# Patient Record
Sex: Female | Born: 1945 | Race: White | Hispanic: No | Marital: Married | Smoking: Never smoker
Health system: Southern US, Community
[De-identification: ages and names within clinical notes are randomized; demographics above are authoritative.]

## PROBLEM LIST (undated history)

## (undated) DIAGNOSIS — I7300 Raynaud's syndrome without gangrene: Secondary | ICD-10-CM

## (undated) DIAGNOSIS — Z9889 Other specified postprocedural states: Secondary | ICD-10-CM

## (undated) DIAGNOSIS — U071 COVID-19: Secondary | ICD-10-CM

## (undated) DIAGNOSIS — E669 Obesity, unspecified: Secondary | ICD-10-CM

## (undated) DIAGNOSIS — L509 Urticaria, unspecified: Secondary | ICD-10-CM

## (undated) DIAGNOSIS — R112 Nausea with vomiting, unspecified: Secondary | ICD-10-CM

## (undated) DIAGNOSIS — I499 Cardiac arrhythmia, unspecified: Secondary | ICD-10-CM

## (undated) DIAGNOSIS — T4145XA Adverse effect of unspecified anesthetic, initial encounter: Secondary | ICD-10-CM

## (undated) DIAGNOSIS — M1990 Unspecified osteoarthritis, unspecified site: Secondary | ICD-10-CM

## (undated) DIAGNOSIS — R079 Chest pain, unspecified: Secondary | ICD-10-CM

## (undated) DIAGNOSIS — T8859XA Other complications of anesthesia, initial encounter: Secondary | ICD-10-CM

## (undated) DIAGNOSIS — E785 Hyperlipidemia, unspecified: Secondary | ICD-10-CM

## (undated) DIAGNOSIS — K219 Gastro-esophageal reflux disease without esophagitis: Secondary | ICD-10-CM

## (undated) DIAGNOSIS — E119 Type 2 diabetes mellitus without complications: Secondary | ICD-10-CM

## (undated) DIAGNOSIS — H1045 Other chronic allergic conjunctivitis: Secondary | ICD-10-CM

## (undated) DIAGNOSIS — E039 Hypothyroidism, unspecified: Secondary | ICD-10-CM

## (undated) HISTORY — PX: TONSILLECTOMY: SUR1361

## (undated) HISTORY — DX: Other chronic allergic conjunctivitis: H10.45

## (undated) HISTORY — DX: Unspecified osteoarthritis, unspecified site: M19.90

## (undated) HISTORY — PX: OTHER SURGICAL HISTORY: SHX169

## (undated) HISTORY — DX: Raynaud's syndrome without gangrene: I73.00

## (undated) HISTORY — PX: VESICOVAGINAL FISTULA CLOSURE W/ TAH: SUR271

## (undated) HISTORY — DX: Type 2 diabetes mellitus without complications: E11.9

## (undated) HISTORY — DX: Urticaria, unspecified: L50.9

## (undated) HISTORY — PX: OOPHORECTOMY: SHX86

## (undated) HISTORY — PX: CHOLECYSTECTOMY: SHX55

## (undated) HISTORY — DX: COVID-19: U07.1

---

## 1998-03-29 ENCOUNTER — Other Ambulatory Visit: Admission: RE | Admit: 1998-03-29 | Discharge: 1998-03-29 | Payer: Self-pay | Admitting: Obstetrics and Gynecology

## 1999-02-28 ENCOUNTER — Other Ambulatory Visit: Admission: RE | Admit: 1999-02-28 | Discharge: 1999-02-28 | Payer: Self-pay | Admitting: Obstetrics and Gynecology

## 2000-04-13 ENCOUNTER — Encounter: Payer: Self-pay | Admitting: Rheumatology

## 2000-04-13 ENCOUNTER — Encounter: Admission: RE | Admit: 2000-04-13 | Discharge: 2000-04-13 | Payer: Self-pay | Admitting: *Deleted

## 2000-04-25 ENCOUNTER — Other Ambulatory Visit: Admission: RE | Admit: 2000-04-25 | Discharge: 2000-04-25 | Payer: Self-pay | Admitting: Obstetrics and Gynecology

## 2000-05-01 ENCOUNTER — Encounter (INDEPENDENT_AMBULATORY_CARE_PROVIDER_SITE_OTHER): Payer: Self-pay

## 2000-05-01 ENCOUNTER — Observation Stay (HOSPITAL_COMMUNITY): Admission: RE | Admit: 2000-05-01 | Discharge: 2000-05-02 | Payer: Self-pay | Admitting: *Deleted

## 2000-05-14 ENCOUNTER — Encounter: Admission: RE | Admit: 2000-05-14 | Discharge: 2000-05-14 | Payer: Self-pay | Admitting: *Deleted

## 2000-12-10 ENCOUNTER — Ambulatory Visit (HOSPITAL_COMMUNITY): Admission: RE | Admit: 2000-12-10 | Discharge: 2000-12-10 | Payer: Self-pay | Admitting: Ophthalmology

## 2000-12-17 ENCOUNTER — Ambulatory Visit (HOSPITAL_COMMUNITY): Admission: RE | Admit: 2000-12-17 | Discharge: 2000-12-17 | Payer: Self-pay | Admitting: Ophthalmology

## 2001-01-14 ENCOUNTER — Ambulatory Visit (HOSPITAL_COMMUNITY): Admission: RE | Admit: 2001-01-14 | Discharge: 2001-01-14 | Payer: Self-pay | Admitting: Ophthalmology

## 2001-06-20 ENCOUNTER — Ambulatory Visit (HOSPITAL_COMMUNITY): Admission: RE | Admit: 2001-06-20 | Discharge: 2001-06-20 | Payer: Self-pay | Admitting: Obstetrics and Gynecology

## 2001-06-20 ENCOUNTER — Encounter (INDEPENDENT_AMBULATORY_CARE_PROVIDER_SITE_OTHER): Payer: Self-pay

## 2001-08-05 ENCOUNTER — Other Ambulatory Visit: Admission: RE | Admit: 2001-08-05 | Discharge: 2001-08-05 | Payer: Self-pay | Admitting: Obstetrics and Gynecology

## 2002-01-02 HISTORY — PX: VAGINAL HYSTERECTOMY: SUR661

## 2002-08-11 ENCOUNTER — Other Ambulatory Visit: Admission: RE | Admit: 2002-08-11 | Discharge: 2002-08-11 | Payer: Self-pay | Admitting: Obstetrics and Gynecology

## 2002-09-02 ENCOUNTER — Encounter (INDEPENDENT_AMBULATORY_CARE_PROVIDER_SITE_OTHER): Payer: Self-pay | Admitting: Specialist

## 2002-09-03 ENCOUNTER — Inpatient Hospital Stay (HOSPITAL_COMMUNITY): Admission: AD | Admit: 2002-09-03 | Discharge: 2002-09-04 | Payer: Self-pay | Admitting: Obstetrics and Gynecology

## 2004-02-22 ENCOUNTER — Other Ambulatory Visit: Admission: RE | Admit: 2004-02-22 | Discharge: 2004-02-22 | Payer: Self-pay | Admitting: Obstetrics and Gynecology

## 2005-02-15 ENCOUNTER — Ambulatory Visit: Payer: Self-pay | Admitting: Critical Care Medicine

## 2005-02-27 ENCOUNTER — Other Ambulatory Visit: Admission: RE | Admit: 2005-02-27 | Discharge: 2005-02-27 | Payer: Self-pay | Admitting: Obstetrics and Gynecology

## 2005-04-10 ENCOUNTER — Ambulatory Visit: Payer: Self-pay | Admitting: Internal Medicine

## 2006-03-12 ENCOUNTER — Ambulatory Visit: Payer: Self-pay | Admitting: Internal Medicine

## 2006-03-12 LAB — CONVERTED CEMR LAB: Streptococcus, Group A Screen (Direct): NEGATIVE

## 2006-04-02 ENCOUNTER — Other Ambulatory Visit: Admission: RE | Admit: 2006-04-02 | Discharge: 2006-04-02 | Payer: Self-pay | Admitting: Obstetrics and Gynecology

## 2006-04-09 ENCOUNTER — Ambulatory Visit: Payer: Self-pay | Admitting: Internal Medicine

## 2007-02-07 ENCOUNTER — Telehealth (INDEPENDENT_AMBULATORY_CARE_PROVIDER_SITE_OTHER): Payer: Self-pay | Admitting: *Deleted

## 2007-02-07 ENCOUNTER — Encounter: Payer: Self-pay | Admitting: Internal Medicine

## 2007-02-07 DIAGNOSIS — I7300 Raynaud's syndrome without gangrene: Secondary | ICD-10-CM

## 2007-02-07 DIAGNOSIS — M1990 Unspecified osteoarthritis, unspecified site: Secondary | ICD-10-CM | POA: Insufficient documentation

## 2007-02-07 DIAGNOSIS — J3089 Other allergic rhinitis: Secondary | ICD-10-CM

## 2007-02-07 DIAGNOSIS — J302 Other seasonal allergic rhinitis: Secondary | ICD-10-CM

## 2007-02-07 DIAGNOSIS — L509 Urticaria, unspecified: Secondary | ICD-10-CM | POA: Insufficient documentation

## 2007-02-07 DIAGNOSIS — H1045 Other chronic allergic conjunctivitis: Secondary | ICD-10-CM

## 2007-02-11 ENCOUNTER — Ambulatory Visit: Payer: Self-pay | Admitting: Internal Medicine

## 2007-02-11 DIAGNOSIS — K219 Gastro-esophageal reflux disease without esophagitis: Secondary | ICD-10-CM | POA: Insufficient documentation

## 2007-02-27 ENCOUNTER — Encounter: Payer: Self-pay | Admitting: Orthopaedic Surgery

## 2007-03-03 ENCOUNTER — Encounter: Payer: Self-pay | Admitting: Orthopaedic Surgery

## 2007-04-09 ENCOUNTER — Encounter: Payer: Self-pay | Admitting: Internal Medicine

## 2007-05-13 ENCOUNTER — Encounter: Admission: RE | Admit: 2007-05-13 | Discharge: 2007-05-13 | Payer: Self-pay | Admitting: Orthopedic Surgery

## 2007-06-18 ENCOUNTER — Telehealth (INDEPENDENT_AMBULATORY_CARE_PROVIDER_SITE_OTHER): Payer: Self-pay | Admitting: *Deleted

## 2007-07-01 ENCOUNTER — Encounter: Payer: Self-pay | Admitting: Internal Medicine

## 2007-07-01 ENCOUNTER — Other Ambulatory Visit: Admission: RE | Admit: 2007-07-01 | Discharge: 2007-07-01 | Payer: Self-pay | Admitting: Obstetrics and Gynecology

## 2007-09-23 ENCOUNTER — Telehealth (INDEPENDENT_AMBULATORY_CARE_PROVIDER_SITE_OTHER): Payer: Self-pay | Admitting: *Deleted

## 2007-09-25 ENCOUNTER — Telehealth (INDEPENDENT_AMBULATORY_CARE_PROVIDER_SITE_OTHER): Payer: Self-pay | Admitting: *Deleted

## 2008-02-10 ENCOUNTER — Ambulatory Visit: Payer: Self-pay | Admitting: Internal Medicine

## 2008-02-17 ENCOUNTER — Ambulatory Visit: Payer: Self-pay | Admitting: Obstetrics and Gynecology

## 2008-07-18 ENCOUNTER — Emergency Department (HOSPITAL_COMMUNITY): Admission: EM | Admit: 2008-07-18 | Discharge: 2008-07-18 | Payer: Self-pay | Admitting: Emergency Medicine

## 2008-07-20 ENCOUNTER — Ambulatory Visit: Payer: Self-pay | Admitting: Obstetrics and Gynecology

## 2008-07-22 ENCOUNTER — Ambulatory Visit: Payer: Self-pay | Admitting: Obstetrics and Gynecology

## 2008-10-07 ENCOUNTER — Telehealth (INDEPENDENT_AMBULATORY_CARE_PROVIDER_SITE_OTHER): Payer: Self-pay | Admitting: *Deleted

## 2009-02-08 ENCOUNTER — Ambulatory Visit: Payer: Self-pay | Admitting: Internal Medicine

## 2009-02-08 DIAGNOSIS — J018 Other acute sinusitis: Secondary | ICD-10-CM | POA: Insufficient documentation

## 2009-07-06 ENCOUNTER — Ambulatory Visit: Payer: Self-pay | Admitting: Internal Medicine

## 2009-08-23 ENCOUNTER — Telehealth (INDEPENDENT_AMBULATORY_CARE_PROVIDER_SITE_OTHER): Payer: Self-pay | Admitting: *Deleted

## 2009-10-01 ENCOUNTER — Ambulatory Visit (HOSPITAL_BASED_OUTPATIENT_CLINIC_OR_DEPARTMENT_OTHER): Admission: RE | Admit: 2009-10-01 | Discharge: 2009-10-01 | Payer: Self-pay | Admitting: Orthopedic Surgery

## 2009-11-23 ENCOUNTER — Telehealth (INDEPENDENT_AMBULATORY_CARE_PROVIDER_SITE_OTHER): Payer: Self-pay | Admitting: *Deleted

## 2009-12-09 ENCOUNTER — Emergency Department (HOSPITAL_COMMUNITY)
Admission: EM | Admit: 2009-12-09 | Discharge: 2009-12-09 | Payer: Self-pay | Source: Home / Self Care | Admitting: Family Medicine

## 2010-01-24 ENCOUNTER — Ambulatory Visit
Admission: RE | Admit: 2010-01-24 | Discharge: 2010-01-24 | Payer: Self-pay | Source: Home / Self Care | Attending: Internal Medicine | Admitting: Internal Medicine

## 2010-02-01 NOTE — Assessment & Plan Note (Signed)
Summary: 12 months/apc   Primary Provider/Referring Provider:  Coral Spikes  CC:  follow up visit-nasal/head congestion and tightness.  History of Present Illness:  2008/03/03- Acute visit 1 week sinusitis, asks neb and shot. Tried Mucinex without help. Blowing yellow. No fever . Having frontal headache. Chest is ok. Taking doxycycline now. Can't tolerate decongestants due to tachypalpitation. Gets rebound very quickly with Afrin.  02/10/08- Allergic rhinitis, allergic conjunctivitis One year f/u. In last 3 days has had increasing nasal pressure stuffiness. Not purulent but getting more yellow. No Ears and teeth pressure also. Chest so far is clear. Does not want flu shots.   February 08, 2009- Allergic rhinitis, allergic conjunctivitis 2-3 days after mild cold she has had head congestion, right frontal and maxilary pressue with green from nose.Throat scratchy without fever. Using mucinex.    Current Medications (verified): 1)  Synthroid 100 Mcg  Tabs (Levothyroxine Sodium) 2)  Amitriptyline Hcl 25 Mg  Tabs (Amitriptyline Hcl) 3)  Fluticasone Propionate 50 Mcg/act Susp (Fluticasone Propionate) .Marland Kitchen.. 1-2 Sprays in Each Nostril Daily 4)  Zyrtec Allergy 10 Mg  Tabs (Cetirizine Hcl) .... Use As Directed As Needed 5)  Lutein 20 Mg Tabs (Lutein) .... Take 1 By Mouth Once Daily 6)  Prilosec 20 Mg Cpdr (Omeprazole) .... Take 1 By Mouth Once Daily 7)  Caltrate 600 1500 Mg Tabs (Calcium Carbonate) .... Take 2 Daily  Allergies (verified): 1)  ! Sulfa 2)  ! Pcn 3)  ! Codeine 4)  ! * Tussionex 5)  ! 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    Vital Signs:  Patient profile:   65 year old female Weight:      225.50 pounds O2 Sat:      99 % on Room air Pulse rate:   93 / minute BP sitting:   122 / 80  (left arm) Cuff size:   regular  Vitals Entered By: Reynaldo Minium CMA (February 08, 2009 10:59 AM)  O2 Flow:  Room air  Physical Exam  Additional Exam:  General: A/Ox3; pleasant and cooperative, NAD, overweight SKIN: no rash, lesions NODES: no lymphadenopathy HEENT: [place]/AT, EOM- WNL, Conjuctivae- clear, PERRLA, TM-WNL, Nose- bilateral turbinate edema with thick white mucus., Throat- clear and wnl. [place]  II NECK: Supple w/ fair ROM, JVD- none, normal carotid impulses w/o bruits Thyroid-  CHEST: Clear to P&A HEART: RRR, no m/g/r heard ABDOMEN: Soft and nl;  ZOX:WRUE, nl pulses, no edema  NEURO: Grossly intact to observation      Impression & Recommendations:  Problem # 1:  RHINOSINUSITIS, ACUTE (ICD-461.8)  We discussed Neti pot. For now will give neb and depo, then doxy. Her updated medication list for this problem includes:    Fluticasone Propionate 50 Mcg/act Susp (Fluticasone propionate) .Marland Kitchen... 1-2 sprays in each nostril daily    Doxycycline Hyclate 100 Mg Tabs (Doxycycline hyclate) .Marland KitchenMarland KitchenMarland KitchenMarland Kitchen 2  today then one daily  Medications Added to Medication List This Visit: 1)  Lutein 20 Mg Tabs (Lutein) .... Take 1 by mouth once daily 2)  Prilosec 20 Mg Cpdr (Omeprazole) .... Take 1 by mouth once daily 3)  Caltrate 600 1500 Mg Tabs (Calcium carbonate) .... Take 2 daily 4)   Doxycycline Hyclate 100 Mg Tabs (Doxycycline hyclate) .... 2 today then one daily  Other Orders: Est. Patient Level III (16109) Admin of Therapeutic Inj  intramuscular or subcutaneous (60454) Depo- Medrol 80mg  (J1040) Nebulizer Tx (09811)  Patient Instructions: 1)  Keep scheduled appointment or see me in a year- earlier as needed 2)  neb neo nasal 3)  depo 80 4)  Sudafed-PE may help as an otc decongestant. 5)  Start Mucinex early and try the Neti pot approach to clear your sinuses as needed Prescriptions: DOXYCYCLINE HYCLATE 100 MG TABS (DOXYCYCLINE HYCLATE) 2 today then one daily  #8 x 1   Entered and Authorized by:   Waymon Budge MD   Signed by:   Waymon Budge MD on 02/08/2009   Method used:   Print then Give to Patient   RxID:   [id number]      Medication Administration  Injection # 1:    Medication: Depo- Medrol 80mg     Diagnosis: RHINOSINUSITIS, ACUTE (ICD-461.8)    Route: SQ    Site: RUOQ gluteus    Exp Date: 10/2009    Lot #: [id number]    Mfr: Teva    Patient tolerated injection without complications    Given by: Signa Kell  Medication # 1:    Medication: EMR miscellaneous medications    Diagnosis: RHINOSINUSITIS, ACUTE (ICD-461.8)    Dose: 3 drops     Route: intranasal    Exp Date: 05/2010    Lot #: [id number]    Mfr: bayer    Comments: neo-synephrine    Patient tolerated medication without complications    Given by: Reynaldo Minium CMA (February 08, 2009 1:33 PM)  Orders Added: 1)  Est. Patient Level III [52841] 2)  Admin of Therapeutic Inj  intramuscular or subcutaneous [96372] 3)  Depo- Medrol 80mg  [J1040] 4)  Nebulizer Tx [32440]

## 2010-02-01 NOTE — Progress Notes (Signed)
Summary: dizzy  Phone Note Call from Patient Call back at [phone]   Caller: Patient Call For: young Summary of Call: dizzy ears stopped up [facility] [phone] Initial call taken by: Rickard Patience,  November 23, 2009 1:27 PM  Follow-up for Phone Call        Spoke with pt.  She c/o sinus pressure/congestion and left ear "stopped up" x 2 days.  She is requesting that CDY call in something for this.  Pls advise thanks Allergies:  1)  ! Sulfa 2)  ! Pcn 3)  ! Codeine 4)  ! * Tussionex 5)  ! Biaxin Follow-up by: Vernie Murders,  November 23, 2009 2:16 PM  Additional Follow-up for Phone Call Additional follow up Details #1::        Per CDY- try sudafed-ask and sign for at the pharmacy counter. Also try earwax kit like Debrox or Cerumenex OTC.Reynaldo Minium CMA  November 23, 2009 2:30 PM    New Allergies: ! SUDAFED Additional Follow-up for Phone Call Additional follow up Details #2::    Spoke with pt and notified of recs per CDY.  Pt states that she is allergic to sudafed- makes her heart race.  I will add this intolerance to her list of allergies.  Pls advise recs thanks! Follow-up by: Vernie Murders,  November 23, 2009 2:37 PM  Additional Follow-up for Phone Call Additional follow up Details #3:: Details for Additional Follow-up Action Taken: Per CDY-Plain Mucinex may help.Reynaldo Minium CMA  November 23, 2009 4:52 PM   Spoke with pt and notified of recs per CDY. Pt verbalized understanding. Additional Follow-up by: Vernie Murders,  November 23, 2009 4:57 PM  New Allergies: ! SUDAFED

## 2010-02-01 NOTE — Assessment & Plan Note (Signed)
Summary: congestion/jd   Primary Provider/Referring Provider:  Coral Spikes  CC:  Congestion, Patient states her nose is stuffy with yello greenish mucous coming out, Patient states thier is a lot of pressure inside her ears, and x 3 days.  History of Present Illness:  2008-02-12- Acute visit 1 week sinusitis, asks neb and shot. Tried Mucinex without help. Blowing yellow. No fever . Having frontal headache. Chest is ok. Taking doxycycline now. Can't tolerate decongestants due to tachypalpitation. Gets rebound very quickly with Afrin.  02/10/08- Allergic rhinitis, allergic conjunctivitis One year f/u. In last 3 days has had increasing nasal pressure stuffiness. Not purulent but getting more yellow. No Ears and teeth pressure also. Chest so far is clear. Does not want flu shots.   February 08, 2009- Allergic rhinitis, allergic conjunctivitis 2-3 days after mild cold she has had head congestion, right frontal and maxilary pressue with green from nose.Throat scratchy without fever. Using mucinex.  July 06, 2009- Allergic rhinitis, allergic conjunctivitis Acute visit- 3 days of head congestion, stopped up ears and  nose. Denies green, sore throat or fever. Feels unsteady Denies earache. chest clear. Has been flusihng nose with saline. Neti pot helps.     Preventive Screening-Counseling & Management  Alcohol-Tobacco     Smoking Status: never  Current Medications (verified): 1)  Synthroid 100 Mcg  Tabs (Levothyroxine Sodium) 2)  Amitriptyline Hcl 25 Mg  Tabs (Amitriptyline Hcl) 3)  Fluticasone Propionate 50 Mcg/act Susp (Fluticasone Propionate) .Marland Kitchen.. 1-2 Sprays in Each Nostril Daily 4)  Zyrtec Allergy 10 Mg  Tabs (Cetirizine Hcl) .... Use As Directed As Needed 5)  Lutein 20 Mg Tabs (Lutein) .... Take 1 By Mouth Once Daily 6)  Prilosec 20 Mg Cpdr (Omeprazole) .... Take 1 By Mouth Once Daily 7)  Caltrate 600 1500 Mg Tabs (Calcium Carbonate) .... Take 2 Daily  Allergies: 1)  ! Sulfa 2)  ! Pcn 3)   ! Codeine 4)  ! * Tussionex 5)  ! 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  The patient denies shortness of breath with activity, shortness of breath at rest, productive cough, non-productive cough, coughing up blood, chest pain, irregular heartbeats, acid heartburn, indigestion, loss of appetite, weight change, abdominal pain, difficulty swallowing, sore throat, tooth/dental problems, and headaches.    Vital Signs:  Patient profile:   65 year old female Height:      64 inches Weight:      221 pounds BMI:     38.07 O2 Sat:      93 % on Room air Pulse rate:   112 / minute BP sitting:   126 / 66  (right arm) Cuff size:   regular  Vitals Entered By: Carver Fila (July 06, 2009 4:27 PM)  O2 Flow:  Room air CC: Congestion, Patient states her nose is stuffy with yello greenish mucous coming out, Patient states thier is a lot of pressure inside her ears, x 3 days Comments  Meds and allergies updated Mindy Edward Jolly  July 06, 2009 4:30 PM    Physical Exam  Additional Exam:  General: A/Ox3; pleasant and cooperative, NAD, overweight SKIN: no rash, lesions NODES: no lymphadenopathy HEENT: [place]/AT, EOM- WNL, Conjuctivae- clear, PERRLA, TM-WNL, Nose- bilateral turbinate edema with thick white mucus., Throat- clear and wnl. Mallampati II,    NECK: Supple w/ fair ROM, JVD- none, normal carotid impulses w/o bruits Thyroid-  CHEST: Clear to P&A HEART: RRR, no m/g/r heard ABDOMEN: Soft and nl;  EAV:WUJW, nl pulses, no edema  NEURO: Grossly intact to observation      Impression & Recommendations:  Problem # 1:  ALLERGIC RHINITIS (ICD-477.9)  Acute rhinitis with eustachian dysfunction. Probably this is from air quality as she has been gardening outdoors, rather than allergy.  Her updated medication list for this problem includes:    Fluticasone Propionate 50 Mcg/act Susp (Fluticasone propionate) .Marland Kitchen... 1-2 sprays in each nostril daily    Zyrtec Allergy 10 Mg Tabs (Cetirizine hcl) ..... Use as directed as needed  Problem # 2:  ALLERGIC CONJUNCTIVITIS (ICD-372.14) Eyes are not visbly inflammed today. Will watch for involvement if she doesn't dlear.  Medications Added to Medication List This Visit: 1)  Doxycycline Hyclate 100 Mg Caps (Doxycycline hyclate) .... 2 today then one daily  Other Orders: Est. Patient Level III (11914) Nebulizer Tx (78295) Depo- Medrol 80mg  (J1040) Admin of Therapeutic Inj  intramuscular or subcutaneous (62130)  Patient Instructions: 1)  Keep appointment in February unless needed sooner 2)  Neb neo nasal 3)  depo 80 4)  script for doxycycline Prescriptions: DOXYCYCLINE HYCLATE 100 MG CAPS (DOXYCYCLINE HYCLATE) 2 today then one daily  #8 x 0   Entered and Authorized by:   Waymon Budge MD   Signed by:   Waymon Budge MD on 07/06/2009   Method used:   Print then Give to Patient   RxID:   [id number]    Medication Administration  Injection # 1:    Medication: Depo- Medrol 80mg     Diagnosis: ALLERGIC RHINITIS (ICD-477.9)    Route: IM    Site: RUOQ gluteus    Exp Date: 04/2012    Lot #: [id number]    Mfr: Pharmacia    Patient tolerated injection without complications    Given by: Carver Fila (July 06, 2009 5:14 PM)  Medication # 1:    Medication: EMR miscellaneous medications     Diagnosis: ALLERGIC RHINITIS (ICD-477.9)    Dose: 3 drops    Route: intranasal    Exp Date: 10/2010    Lot #: [id number]    Mfr: Bayer    Comments: Neo-Synephrine    Patient tolerated medication without complications    Given by: Lynnae Sandhoff  Orders Added: 1)  Est. Patient Level III [27253] 2)  Nebulizer Tx [66440] 3)  Depo- Medrol 80mg  [J1040] 4)  Admin of Therapeutic Inj  intramuscular or subcutaneous [34742]

## 2010-02-01 NOTE — Progress Notes (Signed)
Summary: wrong pharm- rx for Fluticasone  Phone Note Call from Patient Call back at Home Phone [phone]   Caller: Patient Call For: young Summary of Call: pt states that rx for FLUTICASONE should not go to [facility] but must be called in to [facility]- [phone]. pt # home or cell [phone] Initial call taken by: Tivis Ringer, CNA,  August 23, 2009 10:55 AM  Follow-up for Phone Call        per pt's chart, looks like went sent refills for pt on 08-10-2009 but it is not documented what pharmacy refills were sent to.  pt requesting rx be sent to [facility].   rx sent.  pt aware.  Aundra Millet Reynolds LPN  August 23, 2009 11:35 AM     Prescriptions: FLUTICASONE PROPIONATE 50 MCG/ACT SUSP (FLUTICASONE PROPIONATE) 1-2 sprays in each nostril daily  #3 x 3   Entered by:   Arman Filter LPN   Authorized by:   Waymon Budge MD   Signed by:   Arman Filter LPN on 47/82/9562   Method used:   Electronically to        [facility]* (retail)             ,          Ph: [phone]       Fax: [phone]   RxID:   [id number]

## 2010-02-03 NOTE — Assessment & Plan Note (Signed)
Summary: per pt call/cough, bronchitis/mhh   Primary Provider/Referring Provider:  Levitin  CC:  acute visit. pt c/o dry cough. sore thraot, nasal congestion, increased sob w/ exertion, and little wheezing x 4 days.  History of Present Illness:  02/10/08- Allergic rhinitis, allergic conjunctivitis One year f/u. In last 3 days has had increasing nasal pressure stuffiness. Not purulent but getting more yellow. No Ears and teeth pressure also. Chest so far is clear. Does not want flu shots.   February 08, 2009- Allergic rhinitis, allergic conjunctivitis 2-3 days after mild cold she has had head congestion, right frontal and maxilary pressue with green from nose.Throat scratchy without fever. Using mucinex.  July 06, 2009- Allergic rhinitis, allergic conjunctivitis Acute visit- 3 days of head congestion, stopped up ears and  nose. Denies green, sore throat or fever. Feels unsteady Denies earache. chest clear. Has been flushing nose with saline. Neti pot helps.  27-Jan-2010- Allergic rhinitis, allergic conjunctivitis Nurse-CC: acute visit. pt c/o dry cough. sore throat, nasal congestion, increased sob w/ exertion, little wheezing x 4 days Acute onset sore throat 4 days ago on waking, dry cough, hard to swallow, frontal and maxillary ache. No fever. GI ok. Minimal back ache. Mild wheeze, increased dyspnea with exertion. Did have flu vax.      Preventive Screening-Counseling & Management  Alcohol-Tobacco     Smoking Status: never  Current Medications (verified): 1)  Synthroid 100 Mcg  Tabs (Levothyroxine Sodium) 2)  Amitriptyline Hcl 25 Mg  Tabs (Amitriptyline Hcl) 3)  Fluticasone Propionate 50 Mcg/act Susp (Fluticasone Propionate) .Marland Kitchen.. 1-2 Sprays in Each Nostril Daily 4)  Zyrtec Allergy 10 Mg  Tabs (Cetirizine Hcl) .... Use As Directed As Needed 5)  Lutein 20 Mg Tabs (Lutein) .... Take 1 By Mouth Once Daily 6)  Prilosec 20 Mg Cpdr (Omeprazole) .... Take 1 By Mouth Once Daily 7)   Caltrate 600 1500 Mg Tabs (Calcium Carbonate) .... Take 2 Daily  Allergies (verified): 1)  ! Sulfa 2)  ! Pcn 3)  ! Codeine 4)  ! * Tussionex 5)  ! Biaxin 6)  ! Sudafed 7)  ! 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    Vital Signs:  Patient profile:   66 year old female Height:      64 inches Weight:      229.13 pounds O2 Sat:      96 % on Room air Pulse rate:   94 / minute BP sitting:   122 / 70  (left arm) Cuff size:   large  Vitals Entered By: Carver Fila (27-Jan-2010 11:00 AM)  O2 Flow:  Room air CC: acute visit. pt c/o dry cough. sore thraot, nasal congestion, increased sob w/ exertion, little wheezing x 4 days Comments MEDS AND  ALLERGIES UPDATED Phone number updated Mindy Silva  January 24, 2010 11:01 AM    Physical Exam  Additional Exam:  General: A/Ox3; pleasant and cooperative, NAD, overweight SKIN: no rash, lesions NODES: no lymphadenopathy HEENT: [place]/AT, EOM- WNL, Conjuctivae- clear, PERRLA, TM-WNL, Nose- bilateral turbinate edema with thick white  mucus., Throat- clear and wnl. Mallampati II,  NECK: Supple w/ fair ROM, JVD- none, normal carotid impulses w/o bruits Thyroid-  CHEST: Clear to P&A, coarse  HEART: RRR, no m/g/r heard ABDOMEN: Soft and nl;  DGU:YQIH, nl pulses, no edema  NEURO: Grossly intact to observation      Impression & Recommendations:  Problem # 1:  RHINOSINUSITIS, ACUTE (ICD-461.8)  Upper respiratory infection with bronchitis. We will give doxycycline, encourage fluids,  She feels she needs nasal neb and depo based on past experience.  The following medications were removed from the medication list:    Doxycycline Hyclate 100 Mg Caps (Doxycycline hyclate) .Marland Kitchen... 2 today then one daily Her updated medication list for this problem includes:    Fluticasone Propionate 50 Mcg/act Susp (Fluticasone propionate) .Marland Kitchen... 1-2 sprays in each nostril daily    Doxycycline Hyclate 100 Mg Caps (Doxycycline hyclate) .Marland Kitchen... 2 today then one daily  Problem # 2:  ALLERGIC RHINITIS (ICD-477.9)  Seasonal flare is not apparent this early in the new year.  Her updated medication list for this problem includes:    Fluticasone Propionate 50 Mcg/act Susp (Fluticasone propionate) .Marland Kitchen... 1-2 sprays in each nostril daily    Zyrtec Allergy 10 Mg Tabs (Cetirizine hcl) ..... Use as directed as needed  Medications Added to Medication List This Visit: 1)  Doxycycline Hyclate 100 Mg Caps (Doxycycline hyclate) .... 2 today then one daily  Other Orders: Est. Patient Level III (47425) Admin of Therapeutic Inj  intramuscular or subcutaneous (95638) Depo- Medrol 80mg  (J1040) Nebulizer Tx (75643)  Patient Instructions: 1)  Please schedule a follow-up appointment in 6 months. 2)  neb neo nasal 3)  depo 80  4)  script for doxycycline sent to your drug store 5)  Fluids, rest, mucinex if needed Prescriptions: DOXYCYCLINE HYCLATE 100 MG CAPS (DOXYCYCLINE HYCLATE) 2 today then one daily  #8 x 0   Entered and Authorized by:   Waymon Budge MD    Signed by:   Waymon Budge MD on 01/24/2010   Method used:   Electronically to        [facility]* (retail)       [address]       [place], [place]  [address]       Ph: [phone] or [phone]       Fax: [phone]   RxID:   [id number]    Immunization History:  Influenza Immunization History:    Influenza:  historical (10/02/2009)    Medication Administration  Injection # 1:    Medication: Depo- Medrol 80mg     Diagnosis: RHINOSINUSITIS, ACUTE (ICD-461.8)    Route: IM    Site: LUOQ gluteus    Exp Date: 07/2012    Lot #: [id number]    Mfr: Pharmacia    Patient tolerated injection without complications    Given by: Carver Fila (January 24, 2010 11:43 AM)  Medication # 1:    Medication: EMR miscellaneous medications    Diagnosis: RHINOSINUSITIS, ACUTE (ICD-461.8)    Dose: 3 DROPS    Route: intranasal    Exp Date: 04/2011    Lot #:  [id number]    Mfr: bayer    Comments: neo-synephrine    Patient tolerated medication without complications    Given by: Carver Fila (January 24, 2010 11:44 AM)  Orders Added: 1)  Est. Patient Level III [45409] 2)  Admin of Therapeutic Inj  intramuscular or subcutaneous [96372] 3)  Depo- Medrol 80mg  [J1040] 4)  Nebulizer Tx [81191]

## 2010-02-07 ENCOUNTER — Ambulatory Visit: Payer: Self-pay | Admitting: Internal Medicine

## 2010-02-19 ENCOUNTER — Emergency Department (HOSPITAL_COMMUNITY): Payer: BC Managed Care – PPO

## 2010-02-19 ENCOUNTER — Emergency Department (HOSPITAL_COMMUNITY)
Admission: EM | Admit: 2010-02-19 | Discharge: 2010-02-19 | Disposition: A | Discharge status: Home / Self Care | Payer: BC Managed Care – PPO | Attending: Emergency Medicine | Admitting: Emergency Medicine

## 2010-02-19 DIAGNOSIS — H53149 Visual discomfort, unspecified: Secondary | ICD-10-CM | POA: Insufficient documentation

## 2010-02-19 DIAGNOSIS — E039 Hypothyroidism, unspecified: Secondary | ICD-10-CM | POA: Insufficient documentation

## 2010-02-19 DIAGNOSIS — R110 Nausea: Secondary | ICD-10-CM | POA: Insufficient documentation

## 2010-02-19 DIAGNOSIS — R51 Headache: Secondary | ICD-10-CM | POA: Insufficient documentation

## 2010-02-19 DIAGNOSIS — H409 Unspecified glaucoma: Secondary | ICD-10-CM | POA: Insufficient documentation

## 2010-04-10 LAB — URINALYSIS, ROUTINE W REFLEX MICROSCOPIC
Bilirubin Urine: NEGATIVE
Glucose, UA: NEGATIVE mg/dL
Hgb urine dipstick: NEGATIVE
Ketones, ur: NEGATIVE mg/dL
Nitrite: NEGATIVE
Protein, ur: NEGATIVE mg/dL
Specific Gravity, Urine: 1.016 (ref 1.005–1.030)
Urobilinogen, UA: 0.2 mg/dL (ref 0.0–1.0)
pH: 7 (ref 5.0–8.0)

## 2010-05-20 NOTE — Op Note (Signed)
[facility]  Patient:    Jill Dean, Jill Dean                          MRN: [id number] Proc. Date: 05/01/00 Adm. Date:  [id number] Attending:  Vikki Ports.                           Operative Report  PREOPERATIVE DIAGNOSIS:  Symptomatic cholelithiasis.  POSTOPERATIVE DIAGNOSIS:  Symptomatic cholelithiasis.  PROCEDURE:  Laparoscopic cholecystectomy.  SURGEON:  Catalina Lunger, M.D.  ASSISTANTRiley Lam A. Magnus Ivan, M.D.  ANESTHESIA:  General.  DESCRIPTION OF PROCEDURE:  The patient was taken to the operating room and placed in the supine position.  After adequate anesthesia was induced via endotracheal tube, the abdomen was prepped and draped in the normal sterile fashion.  Using a transverse infraumbilical incision, I dissected down to the fascia.  This was opened vertically.  An 0 Vicryl pursestring suture was placed around the fascial defect, and the peritoneum was easily entered.  A Hasson trocar was placed in the abdomen, and the abdomen was insufflated with carbon dioxide to a pressure of 15 mmHg.  Under direct visualization, a 10 mm port was placed in the subxiphoid region.  Two 5 mm ports were placed in the right abdomen.  The gallbladder was identified and retracted cephalad.  There was minimal edema.  Dissection began at the neck of the gallbladder until the cystic duct measuring about 1-2 mm was identified, dissected free, triply clipped and divided.  The cystic artery was identified and dissected free, triply clipped, and divided.  The gallbladder was then taken off the gallbladder bed using Bovie electrocautery.  A small hole was made in the posterior wall of the gallbladder during the dissection, and there was a moderate amount of bile spillage.  The entire gallbladder was removed and placed in an EndoCatch bag and removed through the umbilical port.  Adequate hemostasis of the liver bed was obtained using electrocautery.  The  right upper quadrant was copiously irrigated until fluid returned clear.  The abdomen was allowed to deflate, and the fascial defect was closed with the 0 Vicryl pursestring suture.  Skin incisions were closed with subcuticular 4-0 Monocryl.  All incisions were injected using 0.5% Marcaine.  Steri-Strips and sterile dressings were applied.  The patient tolerated the procedure well and went to PACU in good condition. DD:  05/01/00 TD:  05/01/00 Job: [id number]

## 2010-05-20 NOTE — Op Note (Signed)
[facility]  Patient:    Jill Dean, [name] S Visit Number: [id number] MRN: [id number]          Service Type: DSU Location: [facility] Attending Physician:  Sharon Mt Dictated by:   Rande Brunt. Eda Paschal, M.D. Proc. Date: 06/20/01 Admit Date:  06/20/2001                             Operative Report  PREOPERATIVE DIAGNOSES:       Submucous myoma with postmenopausal bleeding.  POSTOPERATIVE DIAGNOSES:      Submucous myoma with postmenopausal bleeding.  NAME OF OPERATION:            Hysteroscopic resection of submucous myoma.  SURGEON:                      Daniel L. Eda Paschal, M.D.  ANESTHESIA:                   General.  INDICATIONS:                  The patient is a 65 year old gravida 1, para 1,k AB0 who presented to the office with postmenopausal bleeding on Prempro. Sonohysterogram revealed a 3 cm submucous myoma and she now enters the hospital for resection of the submucous myoma which is felt to be responsible for the postmenopausal bleeding.  FINDINGS:                     External and vaginal is within normal limits. Cervix is clean.  Uterus is top normal size and shape with first degree uterine descensus.  Adnexa are palpably normal.  At the time of hysteroscopy the patient had a 3 cm submucous myoma coming off the anterior wall of the fundus.  There were several seedling myomas that could be seen pushing through posteriorly; however, they were not within the endometrial cavity.  The submucous myoma anteriorly actually entered into the wall of the uterus but could be completely removed.  The rest of the exploration hysteroscopically revealed a normal cavity and a normal endocervix.  PROCEDURE:                    After adequate general anesthesia the patient was placed in the dorsal lithotomy position, prepped and draped in the usual sterile manner.  Her laminaria tent was removed.  She was given 1 g Cefotan IV.  The cervix was now  completely dilated and therefore dilatation was not necessary.  The hysteroscopic resectoscope attached to the single wire loop was placed in the cavity.  Settings were 70 coag, 110 cutting.  An attempt was made to start the procedure but we could not get current to flow through the single wire loop.  All different parts were checked.  Parts kept being changed including the Bovie but it still would not work.  Finally, we changed the hysteroscope itself and with a new hysteroscope all worked well, thinking that there was something wrong with the original hysteroscope.  Once the new hysteroscope was in place, using 3% sorbitol to expand the intrauterine cavity, using a camera for magnification, and using again settings of 70 coag, 110 cutting, blend 1, the myoma could be resected in parts.  It took approximately 30 minutes to resect it.  It did enter the wall anteriorly, but taking care not to perforate, the entire myoma could be removed.  Bleeding was  controlled with the single wire loop with the coag setting.  At the termination of the procedure there was absolutely no bleeding noted and the myoma was completely removed.  Fluid deficit was 130 cc with none replaced. The patient tolerated procedure well and left the operating room in satisfactory condition. Dictated by:   Rande Brunt. Eda Paschal, M.D. Attending Physician:  Sharon Mt DD:  06/20/01 TD:  06/21/01 Job: [id number]

## 2010-05-20 NOTE — Op Note (Signed)
NAMEANVITA, Jill Dean                             ACCOUNT NO.:  [id number]   MEDICAL RECORD NO.:  [id number]                   PATIENT TYPE:  OBV   LOCATION:  9305                                 FACILITY:  [facility]   PHYSICIAN:  Daniel L. Eda Paschal, M.D.           DATE OF BIRTH:  Apr 29, 1945   DATE OF PROCEDURE:  09/02/2002  DATE OF DISCHARGE:                                 OPERATIVE REPORT   PREOPERATIVE DIAGNOSIS:  Postmenopausal bleeding with submucous myoma.   POSTOPERATIVE DIAGNOSES:  1. Postmenopausal bleeding with submucous myoma.  2. Endometriosis.   OPERATION:  Laparoscopically-assisted vaginal hysterectomy, right salpingo-  oophorectomy.   SURGEON:  Daniel L. Eda Paschal, M.D.   FIRST ASSISTANT:  Juan H. Lily Peer, M.D.   ANESTHESIA:  General endotracheal.   FINDINGS:  At the time of surgery the patient's uterus was slightly enlarged  and boggy.  There was pericolonic fat attached to where her left ovary and  tube had been removed, but there was no bowel adherent to the uterus.  The  right ovary and tube appeared to be normal.  There were two areas of  endometriosis on the vesicouterine fold of peritoneum for a total size of 1  cm.  It was superficial, not deep.   DESCRIPTION OF PROCEDURE:  After adequate general endotracheal anesthesia,  the patient was placed in the dorsal lithotomy position, prepped and draped  in the usual sterile manner.  A Foley catheter was inserted into the  bladder.  A Hulka catheter was inserted into the uterus.  A pneumoperitoneum  was created with a Veress needle and a 12 mm trocar was placed  subumbilically.  Through that the diagnostic laparoscope was placed.  Two 5  mm ports were placed in the pelvis, one in the midline and one in the right  lower quadrant.  The pelvis was visualized and was as noted above.  Using a  tripolar, attachments of fat to the uterus on the left where the ovary and  tube were, were bipolared and cut.  The round  ligament on the left was  bipolared and cut.  The vesicouterine fold of peritoneum was sharply incised  on the left, incorporating the endometriosis with the specimen.  On the  right the ureter was identified and then the IP ligament was bipolared and  cut.  The round ligament was bipolared and cut and the vesicouterine fold of  peritoneum was completely dissected free on the right with a sharp scissors.  At this point the surgery was continued vaginally.  A 1:200,000 solution of  epinephrine was injected around the cervix.  A 360 degree incision was made  around the cervix.  The bladder was mobilized superiorly, as was the  posterior peritoneum.  The posterior peritoneum and vesicouterine fold of  peritoneum were entered by sharp dissection.  The uterosacral ligaments were  clamped and sutured to the vault laterally for  good vault support.  They  were shortened as well in doing this.  The cardinal ligaments, uterine  arteries, and balance of the broad ligament were clamped, cut, and suture  ligated.  the uterine arteries were doubly ligated.  Chromic #1 catgut was  utilized for all sutures.  The uterus, right ovary, and tube were delivered,  sent to pathology for tissue diagnosis.  A peritoneum and cuff closure  suture of 0 Vicryl was placed 360 degrees around to control bleeding.  A  modified McCall enterocele suture of 3-0 Vicryl was placed to eliminate any  potential enterocele in the future.  The cuff was closed with figure-of-  eights of #1 chromic.  The vagina was copiously irrigated with Ringer's  lactate.  The surgeons then regloved, went above.  There was a little bit of  oozing at the top of the cuff, which was controlled with bipolar  coagulation.  Surgicel was left in place.  All cul-de-sac fluid was removed.  There was no active bleeding at the termination of the procedure even when  all the pressure on the laparoscope was reduced to be sure there was not any  venous oozing.   All trocars were removed.  The subumbilical fascial incision  was closed with 0 Vicryl and the skin incisions were closed with Steri-  Strips.  Estimated blood loss for the entire procedure was 350 mL with none  replaced.  The patient tolerated the procedure well and left the operating  room in satisfactory condition draining clear urine from her Foley catheter.                                               Daniel L. Eda Paschal, M.D.    [name]  D:  09/02/2002  T:  09/03/2002  Job:  [id number]

## 2010-05-20 NOTE — Op Note (Signed)
   [name], Jill Dean                             ACCOUNT NO.:  [id number]   MEDICAL RECORD NO.:  [id number]                   PATIENT TYPE:  OBV   LOCATION:  9305                                 [facility]ACILITY:  [facility]   PHYSICIAN:  Daniel L. Eda Paschal, M.D.           DATE OF BIRTH:  1945/01/19   DATE OF PROCEDURE:  09/02/2002  DATE OF DISCHARGE:                                 OPERATIVE REPORT   PREOPERATIVE DIAGNOSIS:  Persistent postmenopausal bleeding with submucous  myoma.   POSTOPERATIVE DIAGNOSES:  1. Persistent postmenopausal bleeding with submucous myoma.  2. Endometriosis   PROCEDURE:  1. Laparoscopic assisted vaginal hysterectomy.  2. Right salpingo-oophorectomy.   SURGEON:  Daniel L. Eda Paschal, M.D.   ASSISTANT:  Lenard Lance, M.D.   FINDINGS:  At the time of surgery the patient had some pericolonic fat  attached to the uterus, but there was no bowel attached to the uterus.  The  patient had several areas of endometriosis on the vesicouterine fold of  peritoneum.  They were about 0.5 cm each and they were more superficial than  deep.   Dictation ended at this point.                                               Daniel L. Eda Paschal, M.D.    [name]  D:  09/02/2002  T:  09/02/2002  Job:  [id number]

## 2010-05-20 NOTE — Assessment & Plan Note (Signed)
[facility]                             PULMONARY OFFICE NOTE   NAME:Jill Dean, Jill Dean                          MRN:          [id number]  DATE:04/09/2006                            DOB:          Sep 02, 1945    PULMONARY OFFICE FOLLOW-UP:   PROBLEM LIST:  1. Allergic rhinitis.  2. Allergic conjunctivitis.  3. History of urticaria.   HISTORY:  Tussionex was given by the nurse practitioner when she was  here for a week of increased nasal congestion and postnasal drainage in  early March.  After a single dose of Tussionex, she developed flushing,  facial edema, and some itching.  Codeine has previously caused itching.  She understands not to take any more Tussionex.  She has been able to  use an over-the-counter soothing cough syrup.  She considers her  allergic eyes and nose to be well-controlled now on over-the-counter  Zyrtec with regular use of fluticasone.  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   MEDICATIONS:  1. Synthroid 100 mcg.  2. Calcium.  3. Claritin.  4. Nasonex nasal spray.  5. Amitriptyline 25 mg.  6. Zyrtec.   Drug-intolerant to sulfa, penicillin, codeine, shrimp and Tussionex.   OBJECTIVE:  VITAL SIGNS:  Weight 202 pounds, BP 106/66, pulse regular at  114.  Room air saturation 100%.  HEENT:  There is quite a bit of thick white mucus in the right nostril  but no periorbital edema or conjunctival injection and no evident  postnasal drip.  CHEST:  Her chest sounds clear.   IMPRESSION:  1. Rhinitis with possible resolving sinusitis.  2. Idiosyncratic reaction, possibly allergic, to Tussionex.   PLAN:  1. Saline lavage.  2. She will continue her other treatments and schedule return in one      year, earlier p.r.n.     Clinton D. Maple Hudson, MD, Tonny Bollman, FACP  Electronically Signed    CDY/MedQ  DD: 04/09/2006  DT: 04/10/2006  Job #: [id number]   cc:   Demetria Pore. Coral Spikes, M.D.

## 2010-05-20 NOTE — Discharge Summary (Signed)
   Jill Dean, Jill Dean                             ACCOUNT NO.:  [id number]   MEDICAL RECORD NO.:  [id number]                   PATIENT TYPE:  INP   LOCATION:  9305                                 [facility]ACILITY[facility]   PHYSICIAN:  Daniel L. Eda Paschal, M.D.           DATE OF BIRTH:  04-02-1945   DATE OF ADMISSION:  09/02/2002  DATE OF DISCHARGE:  09/04/2002                                 DISCHARGE SUMMARY   The patient is a 65 year old female with persistent recurrent postmenopausal  bleeding and a submucous myoma, who entered the hospital for definitive  surgery.  On the day of admission she was taken to the operating room and a  laparoscopically-assisted vaginal hysterectomy, right salpingo-oophorectomy  was done without difficulty.  Findings included the fibroid and  endometriosis.  Postoperatively she was having a lot of pain on the first  postoperative day and was also not passing gas.  She was kept in the  hospital, watched for signs of impending infection.  Her pain started to  ease.  She started to pass a little bit of gas and by the second  postoperative day she was ready for discharge.   Discharge medication was Darvocet-N 100 for pain relief.  Diet was regular.  Activity was ambulatory.  She will return to the office in three weeks for  further follow-up.  The final pathology report is not available at the time  of dictation.   CONDITION ON DISCHARGE:  Improved.   DISCHARGE DIAGNOSES:  1. Recurrent postmenopausal bleeding.  2. Endometriosis.  3. Submucous myoma.   OPERATION:  Laparoscopically-assisted vaginal hysterectomy, right salpingo-  oophorectomy.                                               Daniel L. Eda Paschal, M.D.    Jill Dean  D:  09/04/2002  T:  09/05/2002  Job:  [id number]

## 2010-05-20 NOTE — Assessment & Plan Note (Signed)
[facility]                             PULMONARY OFFICE NOTE   NAME:Jill Dean, Jill Dean                          MRN:          [id number]  DATE:03/12/2006                            DOB:          Jun 21, 1945    HISTORY OF PRESENT ILLNESS:  The patient is a 65 year old white female  patient of Dr. Maple Hudson who has a known history of allergic rhinitis and  urticaria who presents for a one-week history of nasal congestion,  postnasal drip, sore throat, and cough.  The patient denies any fever,  chest pain, shortness of breath, orthopnea, PND, or leg swelling.   PAST MEDICAL HISTORY:  Reviewed.   CURRENT MEDICATIONS:  Reviewed.   PHYSICAL EXAMINATION:  GENERAL:  The patient is a pleasant female in no  acute distress.  VITAL SIGNS:  She is afebrile, with stable vital signs.  O2 saturation  is 98% on room air.  HEENT:  Nasal mucosa with some mild erythema.  Nontender sinuses.  Posterior pharynx is clear.  NECK:  Supple, without cervical adenopathy.  No JVD.  LUNGS:  Lung sounds are clear.  CARDIAC:  Regular rate and rhythm.  ABDOMEN:  Soft and nontender.  EXTREMITIES:  Warm, without any edema.   IMPRESSION AND PLAN:  Acute upper respiratory infection and rhinitis  flare.  The patient is to begin Mucinex-DM twice daily.  Increase  Nasonex up to 2 puffs twice daily, along with some saline nasal spray  p.r.n.  May use Zyrtec daily as needed for nasal congestion and drip,  and may use Tussionex 4 ounces 1 tsp q.12 h. as needed for cough.  The  patient is aware of sedating effect.  The patient is to return back with  Dr. Maple Hudson as scheduled or sooner if needed.      Rubye Oaks, NP  Electronically Signed      Clinton D. Maple Hudson, MD, Tonny Bollman, FACP  Electronically Signed   TP/MedQ  DD: 03/12/2006  DT: 03/12/2006  Job #: [id number]

## 2010-05-20 NOTE — H&P (Signed)
Jill Dean                             ACCOUNT NO.:  [id number]   MEDICAL RECORD NO.:  [id number]                   PATIENT TYPE:  OBV   LOCATION:  NA                                   [facility]   PHYSICIAN:  Jill Dean, M.D.           DATE OF BIRTH:  [birth year]   DATE OF ADMISSION:  DATE OF DISCHARGE:                                HISTORY & PHYSICAL   CHIEF COMPLAINT:  Persistent post menopausal bleeding.   HISTORY OF PRESENT ILLNESS:  The patient is a 65 year old gravida 1 para 1  AB 0 who had presented to the office in June 2003 with persistent  postmenopausal bleeding on hormonal replacement therapy.  A saline infusion  hysterogram was done in the office and the patient had a submucous myoma.  In June 2003 it was resected, it was completely resected, and then the  patient did well without any further bleeding.  She has remained on her  hormonal replacement therapy, however, as she has been too symptomatic to  stop it.  Then, in August 2004 she returned with recurrent postmenopausal  bleeding.  Saline infusion hysterogram was redone.  The patient now has a  new submucous myoma.  It is not limited to the endometrial cavity.  It is  felt that probably she had a seedling myoma previously and that with the  continuation of hormonal replacement therapy this is why it has grown.  Once  again, it is clearly in a different location than her previous submucous  myoma that was resected 15 months ago.  As a result of the reoccurrence of a  submucous myoma with persistent postmenopausal bleeding, the patient's need  to stay on hormonal replacement therapy, and the fact that the myoma is not  confined to the endometrial cavity, she has elected to proceed with  hysterectomy.  The patient is status post left S&O done in 1984 for  endometrioma and she now desires her right ovary removed as well as she is  postmenopausal.  She will therefore under laparoscopic right S&O  with  assisted vaginal hysterectomy.   PAST MEDICAL HISTORY:  The patient has allergies and takes Claritin one  daily.  She takes amitriptyline 25 mg at bedtime for sleep.  Her sleep  disturbance seems to be most related to arthritis.  She also takes hormonal  replacement therapy although she has been off of it since she has had this  recent episode of bleeding.   ALLERGIES:  SULFA and PENICILLIN.   FAMILY HISTORY:  Father had coronary artery disease.  She also has a family  history of lung cancer.   SOCIAL HISTORY:  She is a nonsmoker, nondrinker.  She is a Interior and spatial designer.   REVIEW OF SYSTEMS:  HEENT:  Negative.  CARDIAC:  Negative.  GI:  Negative.  GU:  Negative.  NEUROMUSCULAR:  Reveals a history of arthritis.  ALLERGY:  Reveals a history of allergies with response to Claritin.  IMMUNOLOGICAL:  Negative.  LYMPHATIC:  Negative.  ENDOCRINE:  Negative.   PHYSICAL EXAMINATION:  GENERAL:  The patient is a well-developed and well-  nourished female in no acute distress.  VITAL SIGNS:  Blood pressure is 118/76, pulse is 80 and regular,  respirations 16 and nonlabored; she is afebrile.  HEENT:  All within normal limits.  NECK:  Supple, trachea in the midline.  Thyroid is not enlarged.  LUNGS:  Clear to P&A.  HEART:  No thrills, heaves, or murmurs.  BREASTS:  No masses.  ABDOMEN:  Soft without guarding, rebound, or masses.  PELVIC:  External is within normal limits.  BUS is within normal limits.  Vaginal exam is normal.  Cervix is clean.  Pap smear shows no atypia.  Uterus is top normal size and shape.  Adnexa fail to reveal masses.  Rectovaginal is confirmatory.  EXTREMITIES:  Within normal limits.   ADMISSION IMPRESSION:  Leiomyomata uteri with persistent postmenopausal  bleeding.   PLAN:  Laparoscopy-assisted vaginal hysterectomy, right salpingo-  oophorectomy.                                               Jill Dean, M.D.    Jill Dean  D:  09/02/2002  T:  09/02/2002   Job:  [id number]

## 2010-07-25 ENCOUNTER — Encounter: Payer: Self-pay | Admitting: Internal Medicine

## 2010-07-25 ENCOUNTER — Ambulatory Visit (INDEPENDENT_AMBULATORY_CARE_PROVIDER_SITE_OTHER): Payer: Medicare Other | Admitting: Internal Medicine

## 2010-07-25 VITALS — BP 110/70 | HR 91 | Ht 64.0 in | Wt 220.6 lb

## 2010-07-25 DIAGNOSIS — J309 Allergic rhinitis, unspecified: Secondary | ICD-10-CM

## 2010-07-25 NOTE — Assessment & Plan Note (Signed)
Doing well, controlled with zyrtec. She may not really need this every day as discussed

## 2010-07-25 NOTE — Progress Notes (Signed)
Subjective:    Patient ID: Jill Dean, female    DOB: [birth year], 65 y.o.   MRN: [id number]  HPI 07/25/10- 42 yoF never smoker, followed for allergic rhinitis/ conjunctivitis complicated by hx Raynaud's synd, GERD, urticaria Last here January 23,2012  Since we gave neb and depo in January she reports doing well, with no significant problems through the change of seasons. Notes 'sometimes" drip or sniff. Takes zyrtec almost every day. This permits her to go in and out of doors with no problem. Has not had bronchitis. Review of Systems Constitutional:   No-   weight loss, night sweats, fevers, chills, fatigue, lassitude. HEENT:   No-   headaches, difficulty swallowing, tooth/dental problems, sore throat,                  No-   sneezing, itching, ear ache, nasal congestion, post nasal drip,   CV:  No-   chest pain, orthopnea, PND, swelling in lower extremities, anasarca,  dizziness, palpitations  GI:  No-   heartburn, indigestion, abdominal pain, nausea, vomiting, diarrhea,                 change in bowel habits, loss of appetite  Resp: No-   shortness of breath with exertion or at rest.  No-  excess mucus,             No-   productive cough,  No non-productive cough,  No-  coughing up of blood.              No-   change in color of mucus.  No- wheezing.    Skin: No-   rash or lesions.  GU: No-   dysuria, change in color of urine, no urgency or frequency.  No- flank pain.  MS:  No-   joint pain or swelling.  No- decreased range of motion.  No- back pain.  Psych:  No- change in mood or affect. No depression or anxiety.  No memory loss.     Objective:   Physical Exam General- Alert, Oriented, Affect-appropriate, Distress- none acute  Overweight Skin- rash-none, lesions- none, excoriation- none Lymphadenopathy- none Head- atraumatic            Eyes- Gross vision intact, PERRLA, conjunctivae clear secretions            Ears- Hearing, canals            Nose- Clear, No-Septal dev,  mucus, polyps, erosion, perforation             Throat- Mallampati II , mucosa clear , drainage- none, tonsils- atrophic Neck- flexible , trachea midline, no stridor , thyroid nl, carotid no bruit Chest - symmetrical excursion , unlabored           Heart/CV- RRR , no murmur , no gallop  , no rub, nl s1 s2                           - JVD- none , edema- none, stasis changes- none, varices- none           Lung- clear to P&A, wheeze- none, cough- none , dullness-none, rub- none           Chest wall-  Abd- tender-no, distended-no, bowel sounds-present, HSM- no Br/ Gen/ Rectal- Not done, not indicated Extrem- cyanosis- none, clubbing, none, atrophy- none, strength- nl Neuro- grossly intact to observation         Assessment &  Plan:

## 2010-07-26 NOTE — Patient Instructions (Signed)
Continue present treatment  Please call as needed 

## 2010-08-24 ENCOUNTER — Encounter: Payer: Self-pay | Admitting: Obstetrics and Gynecology

## 2010-08-24 ENCOUNTER — Ambulatory Visit (INDEPENDENT_AMBULATORY_CARE_PROVIDER_SITE_OTHER): Payer: Medicare Other | Admitting: Obstetrics and Gynecology

## 2010-08-24 ENCOUNTER — Other Ambulatory Visit (HOSPITAL_COMMUNITY)
Admission: RE | Admit: 2010-08-24 | Discharge: 2010-08-24 | Disposition: A | Discharge status: Home / Self Care | Payer: Medicare Other | Source: Ambulatory Visit | Attending: Obstetrics and Gynecology | Admitting: Obstetrics and Gynecology

## 2010-08-24 DIAGNOSIS — B373 Candidiasis of vulva and vagina: Secondary | ICD-10-CM

## 2010-08-24 DIAGNOSIS — Z124 Encounter for screening for malignant neoplasm of cervix: Secondary | ICD-10-CM

## 2010-08-24 DIAGNOSIS — N898 Other specified noninflammatory disorders of vagina: Secondary | ICD-10-CM

## 2010-08-24 DIAGNOSIS — N809 Endometriosis, unspecified: Secondary | ICD-10-CM | POA: Insufficient documentation

## 2010-08-24 DIAGNOSIS — E039 Hypothyroidism, unspecified: Secondary | ICD-10-CM | POA: Insufficient documentation

## 2010-08-24 DIAGNOSIS — N949 Unspecified condition associated with female genital organs and menstrual cycle: Secondary | ICD-10-CM

## 2010-08-24 DIAGNOSIS — Z780 Asymptomatic menopausal state: Secondary | ICD-10-CM

## 2010-08-24 DIAGNOSIS — N951 Menopausal and female climacteric states: Secondary | ICD-10-CM

## 2010-08-24 MED ORDER — ESTRADIOL 0.05 MG/24HR TD PTTW: 1.0000 | MEDICATED_PATCH | TRANSDERMAL | Status: DC

## 2010-08-24 MED ORDER — FLUCONAZOLE 200 MG PO TABS: 200.0000 mg | ORAL_TABLET | Freq: Every day | ORAL | Status: AC

## 2010-08-24 NOTE — Progress Notes (Signed)
The patient came to see me today with 2 problems. The first is she has a vaginal odor. She has tried Monistat without any relief. The second issue is that she is stopped her hormone replacement and is now having terrible hot flashes, sleep disturbance,  and irritability. She has no absolute contraindications to hormone replacement and took it for many years.she is up-to-date on mammograms.  Abdomen is soft without guarding rebound or masses. External: Within normal limits. BUS: Within normal limits. Vaginal examination: Shows yeast on wet prep. Cervix and uterus are absent. Adnexa fails to reveal masses.  Assessment: 1. Yeast vaginitis 2. Menopausal symptoms  Plan: 1. After a long discussion we started her on Vivelle-Dot patches 0.05 mg 1 twice a week. 2. Diflucan 200 mg tablets 1 daily for 3 days. 3. If other persist she will call and we will treat her with MetroGel vaginal cream.

## 2010-08-25 ENCOUNTER — Other Ambulatory Visit: Payer: Self-pay | Admitting: Internal Medicine

## 2010-08-25 MED ORDER — FLUTICASONE PROPIONATE 50 MCG/ACT NA SUSP: 2.0000 | Freq: Every day | NASAL | Status: DC

## 2010-10-14 ENCOUNTER — Ambulatory Visit (INDEPENDENT_AMBULATORY_CARE_PROVIDER_SITE_OTHER): Payer: Medicare Other | Admitting: Internal Medicine

## 2010-10-14 ENCOUNTER — Encounter: Payer: Self-pay | Admitting: Internal Medicine

## 2010-10-14 VITALS — BP 120/72 | HR 108 | Temp 97.9°F | Ht 64.0 in | Wt 221.0 lb

## 2010-10-14 DIAGNOSIS — J018 Other acute sinusitis: Secondary | ICD-10-CM

## 2010-10-14 MED ORDER — METHYLPREDNISOLONE ACETATE 80 MG/ML IJ SUSP
80.0000 mg | Freq: Once | INTRAMUSCULAR | Status: AC
  Administered 2010-10-14: 80 mg via INTRAMUSCULAR

## 2010-10-14 MED ORDER — DOXYCYCLINE HYCLATE 100 MG PO TABS: ORAL_TABLET | ORAL | Status: DC

## 2010-10-14 MED ORDER — PHENYLEPHRINE HCL 1 % NA SOLN
3.0000 [drp] | Freq: Once | NASAL | Status: AC
  Administered 2010-10-14: 3 [drp] via NASAL

## 2010-10-14 NOTE — Patient Instructions (Signed)
Neb neo nasal  Depo 80  Script sent for antibiotic

## 2010-10-14 NOTE — Progress Notes (Signed)
Patient ID: Jill Dean, female    DOB: [birth year], 65 y.o.   MRN: [id number]  HPI 07/25/10- 18 yoF never smoker, followed for allergic rhinitis/ conjunctivitis complicated by hx Raynaud's synd, GERD, urticaria Last here January 23,2012  Since we gave neb and depo in January she reports doing well, with no significant problems through the change of seasons. Notes 'sometimes" drip or sniff. Takes zyrtec almost every day. This permits her to go in and out of doors with no problem. Has not had bronchitis.  10/14/10-  65 yoF never smoker, followed for allergic rhinitis/ conjunctivitis complicated by hx Raynaud's synd, GERD, urticaria She had to resume a regular uses ear to make for nasal congestion and drainage. Recent exposure to somebody sick lead to her having a URI/bronchitis syndrome 3 days later. Nasal congestion, chest congestion, sore throat, maybe some fever, blowing yellow-green.  Review of Systems See HPI Constitutional:   No-   weight loss, night sweats, fevers, chills, fatigue, lassitude. HEENT:   No-  headaches, difficulty swallowing, tooth/dental problems, +sore throat,       +  sneezing, itching, ear ache, nasal congestion, post nasal drip,  CV:  No-   chest pain, orthopnea, PND, swelling in lower extremities, anasarca,  dizziness, palpitations Resp: No-   shortness of breath with exertion or at rest.              No-   productive cough,  No non-productive cough,  No-  coughing up of blood.              No-   change in color of mucus.  No- wheezing.   Skin: No-   rash or lesions. GI:  No-   heartburn, indigestion, abdominal pain, nausea, vomiting, diarrhea,                 change in bowel habits, loss of appetite GU: No-   dysuria, change in color of urine, no urgency or frequency.  No- flank pain. MS:  No-   joint pain or swelling.  No- decreased range of motion.  No- back pain. Neuro- grossly normal to observation, Or:  Psych:  No- change in mood or affect. No depression or  anxiety.  No memory loss.   Objective:   Physical Exam General- Alert, Oriented, Affect-appropriate, Distress- none acute  Overweight Skin- rash-none, lesions- none, excoriation- none Lymphadenopathy- none Head- atraumatic            Eyes- Gross vision intact, PERRLA, conjunctivae clear secretions            Ears- Hearing, canals-normal            Nose- Clear, No-Septal dev, mucus, polyps, erosion, perforation             Throat- Mallampati III-IV , mucosa clear-not red , drainage- none, tonsils- atrophic. Note repeated throat clearing. Neck- flexible , trachea midline, no stridor , thyroid nl, carotid no bruit Chest - symmetrical excursion , unlabored           Heart/CV- RRR , no murmur , no gallop  , no rub, nl s1 s2                           - JVD- none , edema- none, stasis changes- none, varices- none           Lung- clear to P&A but diminished, wheeze- none, cough- none , dullness-none, rub- none  Chest wall-  Abd- tender-no, distended-no, bowel sounds-present, HSM- no Br/ Gen/ Rectal- Not done, not indicated Extrem- cyanosis- none, clubbing, none, atrophy- none, strength- nl Neuro- grossly intact to observation

## 2010-10-15 NOTE — Assessment & Plan Note (Signed)
Acute rhinitis/bronchitis consistent with a viral syndrome and may be early tracheobronchitis. Plan-nebulizer, Depo-Medrol, doxycycline

## 2011-01-02 ENCOUNTER — Other Ambulatory Visit: Payer: Self-pay | Admitting: Dermatology

## 2011-02-15 DIAGNOSIS — L905 Scar conditions and fibrosis of skin: Secondary | ICD-10-CM | POA: Diagnosis not present

## 2011-03-09 DIAGNOSIS — K1321 Leukoplakia of oral mucosa, including tongue: Secondary | ICD-10-CM | POA: Diagnosis not present

## 2011-03-13 DIAGNOSIS — K1321 Leukoplakia of oral mucosa, including tongue: Secondary | ICD-10-CM | POA: Diagnosis not present

## 2011-03-20 DIAGNOSIS — K1321 Leukoplakia of oral mucosa, including tongue: Secondary | ICD-10-CM | POA: Diagnosis not present

## 2011-04-17 ENCOUNTER — Encounter: Payer: Self-pay | Admitting: Internal Medicine

## 2011-04-17 ENCOUNTER — Ambulatory Visit (INDEPENDENT_AMBULATORY_CARE_PROVIDER_SITE_OTHER): Payer: Medicare Other | Admitting: Internal Medicine

## 2011-04-17 VITALS — BP 130/80 | HR 110 | Ht 64.0 in | Wt 224.6 lb

## 2011-04-17 DIAGNOSIS — J018 Other acute sinusitis: Secondary | ICD-10-CM

## 2011-04-17 DIAGNOSIS — J309 Allergic rhinitis, unspecified: Secondary | ICD-10-CM | POA: Diagnosis not present

## 2011-04-17 MED ORDER — PHENYLEPHRINE HCL 1 % NA SOLN
1.0000 [drp] | Freq: Once | NASAL | Status: AC
  Administered 2011-04-17: 1 [drp] via NASAL

## 2011-04-17 MED ORDER — METHYLPREDNISOLONE ACETATE 80 MG/ML IJ SUSP
80.0000 mg | Freq: Once | INTRAMUSCULAR | Status: AC
  Administered 2011-04-17: 80 mg via INTRAMUSCULAR

## 2011-04-17 MED ORDER — DOXYCYCLINE HYCLATE 100 MG PO TABS: ORAL_TABLET | ORAL | Status: DC

## 2011-04-17 NOTE — Progress Notes (Signed)
Patient ID: Jill Dean, female    DOB: 09-Oct-1945, 66 y.o.   MRN: [id number]  HPI 07/25/10- 5 yoF never smoker, followed for allergic rhinitis/ conjunctivitis complicated by hx Raynaud's synd, GERD, urticaria Last here January 23,2012  Since we gave neb and depo in January she reports doing well, with no significant problems through the change of seasons. Notes 'sometimes" drip or sniff. Takes zyrtec almost every day. This permits her to go in and out of doors with no problem. Has not had bronchitis.  10/14/10-  65 yoF never smoker, followed for allergic rhinitis/ conjunctivitis complicated by hx Raynaud's synd, GERD, urticaria She had to resume a regular uses ear to make for nasal congestion and drainage. Recent exposure to somebody sick lead to her having a URI/bronchitis syndrome 3 days later. Nasal congestion, chest congestion, sore throat, maybe some fever, blowing yellow-green.  04/17/11- 65 yoF never smoker, followed for allergic rhinitis/ conjunctivitis complicated by hx Raynaud's synd, GERD, urticaria Had a bronchitis last October which resolved. After that winter was good until current pollen season. Over the weekend developed right maxillary sinus pressure and pain.Using Neti pot. Can't tolerate decongestant stimulants because of blood pressure.  Review of Systems-See HPI Constitutional:   No-   weight loss, night sweats, fevers, chills, fatigue, lassitude. HEENT:   No-  headaches, difficulty swallowing, tooth/dental problems, sore throat,       +  sneezing, itching, ear ache, nasal congestion, post nasal drip,  CV:  No-   chest pain, orthopnea, PND, swelling in lower extremities, anasarca,  dizziness, palpitations Resp: No-   shortness of breath with exertion or at rest.              No-   productive cough,  No non-productive cough,  No-  coughing up of blood.              No-   change in color of mucus.  No- wheezing.   Skin: No-   rash or lesions. GI:  No-   heartburn,  indigestion, abdominal pain, nausea, vomiting,  GU: No-   dysuria,  MS:  No-   joint pain or swelling.   Neuro- nothing unusual Psych:  No- change in mood or affect. No depression or anxiety.  No memory loss.   Objective:   Physical Exam General- Alert, Oriented, Affect-appropriate, Distress- none acute  Overweight Skin- rash-none, lesions- none, excoriation- none Lymphadenopathy- none Head- atraumatic            Eyes- Gross vision intact, PERRLA, conjunctivae clear secretions            Ears- Hearing, canals-normal            Nose- Mucus R, No-Septal dev,  polyps, erosion, perforation             Throat- Mallampati III-IV , mucosa clear-not red , drainage- none, tonsils- atrophic.  Neck- flexible , trachea midline, no stridor , thyroid nl, carotid no bruit Chest - symmetrical excursion , unlabored           Heart/CV- RRR , no murmur , no gallop  , no rub, nl s1 s2                           - JVD- none , edema- none, stasis changes- none, varices- none           Lung- clear to P&A but diminished, wheeze- none, cough- none ,  dullness-none, rub- none           Chest wall-  Abd-  Br/ Gen/ Rectal- Not done, not indicated Extrem- cyanosis- none, clubbing, none, atrophy- none, strength- nl Neuro- grossly intact to observation

## 2011-04-17 NOTE — Patient Instructions (Signed)
Script sent for doxycycline  Neb neo nasal  Depo 80

## 2011-04-22 NOTE — Assessment & Plan Note (Signed)
Plan-nasal decongestant nebulizer, Depo-Medrol, doxycycline and

## 2011-04-22 NOTE — Assessment & Plan Note (Signed)
Seasonal allergic rhinitis. Continue antihistamines and saline rinse. Consider steroid nasal spray.

## 2011-04-24 DIAGNOSIS — I8310 Varicose veins of unspecified lower extremity with inflammation: Secondary | ICD-10-CM | POA: Diagnosis not present

## 2011-04-24 DIAGNOSIS — D485 Neoplasm of uncertain behavior of skin: Secondary | ICD-10-CM | POA: Diagnosis not present

## 2011-05-02 DIAGNOSIS — H109 Unspecified conjunctivitis: Secondary | ICD-10-CM | POA: Diagnosis not present

## 2011-05-09 ENCOUNTER — Encounter: Payer: Self-pay | Admitting: Internal Medicine

## 2011-05-09 ENCOUNTER — Ambulatory Visit (INDEPENDENT_AMBULATORY_CARE_PROVIDER_SITE_OTHER): Payer: Medicare Other | Admitting: Internal Medicine

## 2011-05-09 ENCOUNTER — Telehealth: Payer: Self-pay | Admitting: Internal Medicine

## 2011-05-09 VITALS — BP 114/62 | HR 104 | Temp 98.5°F | Ht 64.5 in | Wt 224.0 lb

## 2011-05-09 DIAGNOSIS — J309 Allergic rhinitis, unspecified: Secondary | ICD-10-CM | POA: Diagnosis not present

## 2011-05-09 DIAGNOSIS — J208 Acute bronchitis due to other specified organisms: Secondary | ICD-10-CM

## 2011-05-09 DIAGNOSIS — J209 Acute bronchitis, unspecified: Secondary | ICD-10-CM

## 2011-05-09 DIAGNOSIS — B9689 Other specified bacterial agents as the cause of diseases classified elsewhere: Secondary | ICD-10-CM

## 2011-05-09 MED ORDER — LEVALBUTEROL HCL 0.63 MG/3ML IN NEBU
0.6300 mg | INHALATION_SOLUTION | Freq: Once | RESPIRATORY_TRACT | Status: AC
  Administered 2011-05-09: 0.63 mg via RESPIRATORY_TRACT

## 2011-05-09 MED ORDER — DOXYCYCLINE HYCLATE 100 MG PO TABS: ORAL_TABLET | ORAL | Status: DC

## 2011-05-09 MED ORDER — METHYLPREDNISOLONE ACETATE 80 MG/ML IJ SUSP
80.0000 mg | Freq: Once | INTRAMUSCULAR | Status: AC
  Administered 2011-05-09: 80 mg via INTRAMUSCULAR

## 2011-05-09 NOTE — Telephone Encounter (Signed)
Called, spoke with pt.  I informed her of below per Dr. Maple Hudson.  Pt verbalized understanding of this and would like rx sent to [facility] in [place].  Also, pt states she does not think the abx alone is going to help -- pt states she believes she needs a neb treatment and injection and would like to come in for this.  Dr. Maple Hudson, pls advise.  Thank you.

## 2011-05-09 NOTE — Telephone Encounter (Signed)
Per CY add the pt on today or tomorrow for neb and depo. Pt set to come today at 3:15. Carron Curie, CMA

## 2011-05-09 NOTE — Patient Instructions (Signed)
Take the doxycycline, along with extra fluids, thorat lozenges etc.  Neb xop 0.63  Depo 80

## 2011-05-09 NOTE — Telephone Encounter (Signed)
Pt calling again in ref to previous msg can be reached at [phone].Jill Dean

## 2011-05-09 NOTE — Progress Notes (Signed)
    Patient ID: Jill Dean, female    DOB: July 23, 1945, 66 y.o.   MRN: [id number]  HPI 07/25/10- 60 yoF never smoker, followed for allergic rhinitis/ conjunctivitis complicated by hx Raynaud's synd, GERD, urticaria Last here January 23,2012  Since we gave neb and depo in January she reports doing well, with no significant problems through the change of seasons. Notes 'sometimes" drip or sniff. Takes zyrtec almost every day. This permits her to go in and out of doors with no problem. Has not had bronchitis.  10/14/10-  65 yoF never smoker, followed for allergic rhinitis/ conjunctivitis complicated by hx Raynaud's synd, GERD, urticaria She had to resume a regular uses ear to make for nasal congestion and drainage. Recent exposure to somebody sick lead to her having a URI/bronchitis syndrome 3 days later. Nasal congestion, chest congestion, sore throat, maybe some fever, blowing yellow-green.  04/17/11- 65 yoF never smoker, followed for allergic rhinitis/ conjunctivitis complicated by hx Raynaud's synd, GERD, urticaria Had a bronchitis last October which resolved. After that winter was good until current pollen season. Over the weekend developed right maxillary sinus pressure and pain.Using Neti pot. Can't tolerate decongestant stimulants because of blood pressure.  05/09/11-  66 yoF never smoker, followed for allergic rhinitis/ conjunctivitis complicated by hx Raynaud's synd, GERD, urticaria Pt c/o prod cough with large amounts of yellow sputum, sore throat, HA, chest tightness, chills and sweats> onset 1 day ago . Spent weekend at the beach if exposed wind and chills. Came home sick. Now starting doxycycline.  Review of Systems-See HPI Constitutional:   No-   weight loss, +night sweats, fevers, chills, fatigue, lassitude. HEENT:   No-  headaches, difficulty swallowing, tooth/dental problems, +sore throat,       +  sneezing, itching, ear ache, nasal congestion, post nasal drip,  CV:  No-   chest pain,  orthopnea, PND, swelling in lower extremities, anasarca,  dizziness, palpitations Resp: No-   shortness of breath with exertion or at rest.             +   productive cough,  No non-productive cough,  No-  coughing up of blood.              +  change in color of mucus.  No- wheezing.    Objective:   Physical Exam General- Alert, Oriented, Affect-appropriate, Distress- none acute  Overweight Skin- rash-none, lesions- none, excoriation- none Lymphadenopathy- none Head- atraumatic            Eyes- Gross vision intact, PERRLA, conjunctivae clear secretions            Ears- Hearing, canals-normal            Nose- Mucus R, No-Septal dev,  polyps, erosion, perforation             Throat- Mallampati III-IV , mucosa clear-not red , drainage- none, tonsils- atrophic.  Neck- flexible , trachea midline, no stridor , thyroid nl, carotid no bruit Chest -            Lung- clear to P&A but diminished, wheeze- none, cough- none , dullness-none, rub- none           Chest wall-  Abd-  Br/ Gen/ Rectal- Not done, not indicated Extrem- cyanosis- none, clubbing, none, atrophy- none, strength- nl Neuro- grossly intact to observation

## 2011-05-09 NOTE — Telephone Encounter (Signed)
Spoke with pt. She is c/o increased SOB, little wheeze and prod cough with minimal yellow sputum x 2 days. She denied any fever, CP, chest tightness or other co's. Please advise recs thanks! Allergies  Allergen Reactions  . Azithromycin     REACTION: itching  . Clarithromycin     REACTION: severe itching  . Codeine   . Hydrocod Polst-Cpm Polst Er   . Penicillins   . Pseudoephedrine     REACTION: tachycardia  . Shrimp (Shellfish Allergy)   . Sulfonamide Derivatives

## 2011-05-09 NOTE — Telephone Encounter (Signed)
Per CY-okay to give Doxycycline 100 mg #8 take 2 today then 1 daily no refills.  

## 2011-05-12 DIAGNOSIS — J208 Acute bronchitis due to other specified organisms: Secondary | ICD-10-CM | POA: Insufficient documentation

## 2011-05-12 DIAGNOSIS — B9689 Other specified bacterial agents as the cause of diseases classified elsewhere: Secondary | ICD-10-CM | POA: Insufficient documentation

## 2011-05-12 NOTE — Assessment & Plan Note (Signed)
Plan-proceed with doxycycline as prescribed. Nebulizer treatment, Depo-Medrol today.

## 2011-05-17 ENCOUNTER — Telehealth: Payer: Self-pay | Admitting: Internal Medicine

## 2011-05-17 DIAGNOSIS — Z1231 Encounter for screening mammogram for malignant neoplasm of breast: Secondary | ICD-10-CM | POA: Diagnosis not present

## 2011-05-17 MED ORDER — DOXYCYCLINE HYCLATE 100 MG PO TABS: ORAL_TABLET | ORAL | Status: DC

## 2011-05-17 NOTE — Telephone Encounter (Signed)
Called spoke with patient who c/o right sided facial pain, yellow nasal drainage, PND, some prod cough with yellow mucus, some wheezing and increased SOB x5days - denies f/c/s.  Requesting rx for doxycycline.  Pt stated she has an appt in [place] at 1130 - ok to LM if she does not answer.  [facility] in [place]. Allergies  Allergen Reactions  . Azithromycin     REACTION: itching  . Clarithromycin     REACTION: severe itching  . Codeine   . Hydrocod Polst-Cpm Polst Er   . Penicillins   . Pseudoephedrine     REACTION: tachycardia  . Shrimp (Shellfish Allergy)   . Sulfonamide Derivatives    Dr Maple Hudson please advise, thanks.

## 2011-05-17 NOTE — Telephone Encounter (Signed)
Left detailed on machine as okayed by pt informing her that CDY okayed for doxycycline as requested.  rx sent to [facility] in [place].  Encouraged pt to call with any questions/concerns.  Will sign off.

## 2011-05-17 NOTE — Telephone Encounter (Signed)
Per CY-okay to give Doxycycline 100 mg #8 take 2 today then 1 daily no refills.  

## 2011-05-24 ENCOUNTER — Telehealth: Payer: Self-pay | Admitting: Internal Medicine

## 2011-05-24 MED ORDER — DOXYCYCLINE HYCLATE 100 MG PO TABS: ORAL_TABLET | ORAL | Status: DC

## 2011-05-24 NOTE — Telephone Encounter (Signed)
Per CY-extend Doxycycline 100 mg 1 daily x 14 days.

## 2011-05-24 NOTE — Telephone Encounter (Signed)
Spoke with pt. She states that finished the rx for doxy that we called in on 05-17-11 and her cough is less and SOB is gone, but she continues to have facial pain and yellow to green blood streaked nasal d/c. She would like another med called in. Please advise, thanks! Allergies  Allergen Reactions  . Azithromycin     REACTION: itching  . Clarithromycin     REACTION: severe itching  . Codeine   . Hydrocod Polst-Cpm Polst Er   . Penicillins   . Pseudoephedrine     REACTION: tachycardia  . Shrimp (Shellfish Allergy)   . Sulfonamide Derivatives

## 2011-05-24 NOTE — Telephone Encounter (Signed)
I spoke with pt and is aware of CDY recs. RX has been sent to the pharmacy. Nothing further was needed 

## 2011-07-24 ENCOUNTER — Ambulatory Visit: Payer: Medicare Other | Admitting: Internal Medicine

## 2011-09-20 ENCOUNTER — Other Ambulatory Visit: Payer: Self-pay | Admitting: Internal Medicine

## 2011-09-20 DIAGNOSIS — Z23 Encounter for immunization: Secondary | ICD-10-CM | POA: Diagnosis not present

## 2011-09-20 DIAGNOSIS — E041 Nontoxic single thyroid nodule: Secondary | ICD-10-CM | POA: Diagnosis not present

## 2011-09-20 DIAGNOSIS — M79609 Pain in unspecified limb: Secondary | ICD-10-CM | POA: Diagnosis not present

## 2011-09-20 DIAGNOSIS — M949 Disorder of cartilage, unspecified: Secondary | ICD-10-CM | POA: Diagnosis not present

## 2011-09-20 DIAGNOSIS — R7301 Impaired fasting glucose: Secondary | ICD-10-CM | POA: Diagnosis not present

## 2011-09-20 DIAGNOSIS — Z1331 Encounter for screening for depression: Secondary | ICD-10-CM | POA: Diagnosis not present

## 2011-09-20 DIAGNOSIS — E7800 Pure hypercholesterolemia, unspecified: Secondary | ICD-10-CM | POA: Diagnosis not present

## 2011-09-20 DIAGNOSIS — IMO0001 Reserved for inherently not codable concepts without codable children: Secondary | ICD-10-CM | POA: Diagnosis not present

## 2011-09-20 DIAGNOSIS — M899 Disorder of bone, unspecified: Secondary | ICD-10-CM | POA: Diagnosis not present

## 2011-09-20 DIAGNOSIS — Z0000 Encounter for general adult medical examination without abnormal findings: Secondary | ICD-10-CM | POA: Diagnosis not present

## 2011-09-25 DIAGNOSIS — M76829 Posterior tibial tendinitis, unspecified leg: Secondary | ICD-10-CM | POA: Diagnosis not present

## 2011-09-25 DIAGNOSIS — E1149 Type 2 diabetes mellitus with other diabetic neurological complication: Secondary | ICD-10-CM | POA: Diagnosis not present

## 2011-09-26 ENCOUNTER — Other Ambulatory Visit: Payer: Self-pay | Admitting: Orthopedic Surgery

## 2011-09-26 DIAGNOSIS — M25571 Pain in right ankle and joints of right foot: Secondary | ICD-10-CM

## 2011-09-26 DIAGNOSIS — R531 Weakness: Secondary | ICD-10-CM

## 2011-10-02 ENCOUNTER — Ambulatory Visit
Admission: RE | Admit: 2011-10-02 | Discharge: 2011-10-02 | Disposition: A | Discharge status: Home / Self Care | Payer: Medicare Other | Source: Ambulatory Visit | Attending: Orthopedic Surgery | Admitting: Orthopedic Surgery

## 2011-10-02 DIAGNOSIS — M25579 Pain in unspecified ankle and joints of unspecified foot: Secondary | ICD-10-CM | POA: Diagnosis not present

## 2011-10-02 DIAGNOSIS — R531 Weakness: Secondary | ICD-10-CM

## 2011-10-02 DIAGNOSIS — M25571 Pain in right ankle and joints of right foot: Secondary | ICD-10-CM

## 2011-10-04 DIAGNOSIS — M76829 Posterior tibial tendinitis, unspecified leg: Secondary | ICD-10-CM | POA: Diagnosis not present

## 2011-10-26 DIAGNOSIS — M76829 Posterior tibial tendinitis, unspecified leg: Secondary | ICD-10-CM | POA: Diagnosis not present

## 2011-10-26 DIAGNOSIS — E1149 Type 2 diabetes mellitus with other diabetic neurological complication: Secondary | ICD-10-CM | POA: Diagnosis not present

## 2011-11-15 ENCOUNTER — Ambulatory Visit (INDEPENDENT_AMBULATORY_CARE_PROVIDER_SITE_OTHER): Payer: Medicare Other | Admitting: Obstetrics and Gynecology

## 2011-11-15 ENCOUNTER — Encounter: Payer: Self-pay | Admitting: Obstetrics and Gynecology

## 2011-11-15 VITALS — BP 124/74 | Ht 63.0 in | Wt 214.0 lb

## 2011-11-15 DIAGNOSIS — M949 Disorder of cartilage, unspecified: Secondary | ICD-10-CM

## 2011-11-15 DIAGNOSIS — M899 Disorder of bone, unspecified: Secondary | ICD-10-CM

## 2011-11-15 DIAGNOSIS — M8580 Other specified disorders of bone density and structure, unspecified site: Secondary | ICD-10-CM

## 2011-11-15 DIAGNOSIS — R3915 Urgency of urination: Secondary | ICD-10-CM

## 2011-11-15 DIAGNOSIS — Z780 Asymptomatic menopausal state: Secondary | ICD-10-CM

## 2011-11-15 DIAGNOSIS — N952 Postmenopausal atrophic vaginitis: Secondary | ICD-10-CM | POA: Diagnosis not present

## 2011-11-15 NOTE — Progress Notes (Signed)
Patient came back to see me today for further followup. She does have urinary frequency with severe urgency. We discussed medication today but she selected to watch it. She does not have incontinence of urine. She does have vaginal dryness and is using Replens. She is found that the Replens not only helps the vaginal dryness but helps her bladder symptoms. She is having no vaginal bleeding. She is having no pelvic pain. She is up-to-date on mammograms. She is due for followup bone density with osteopenia. She's had no fractures. In 1984 she had a laparotomy with a left salpingo-oophorectomy for ruptured endometrioma. She was able to conceive shortly afterwards. In 2004 she had a laparoscopic-assisted vaginal hysterectomy and right salpingo-oophorectomy for endometriosis and fibroids. She has always had normal Pap smears. Her last Pap smear was2012. She is having no vaginal bleeding. She is having no pelvic pain. She has been diagnosed with diabetes mellitus. She is controlled with diet. She does occasionally have hot flashes.  ROS: 12 system review done. Pertinent positives above. Other positives include GERD,Osteoarthritis.  HEENT: Within normal limits.Kennon Portela present. Neck: No masses. Supraclavicular lymph nodes: Not enlarged. Breasts: Examined in both sitting and lying position. Symmetrical without skin changes or masses. Abdomen: Soft no masses guarding or rebound. No hernias. Pelvic: External within normal limits. BUS within normal limits. Vaginal examination shows poor  estrogen effect, no cystocele enterocele or rectocele. Cervix and uterus absent. Adnexa within normal limits. Rectovaginal confirmatory. Extremities within normal limits.  Assessment: #1. Atrophic vaginitis #2. Detrussor instability #3. Hot flashes #4. Osteopenia #5. Endometriosis  Plan: Continue Replens. Offered medication for DI. She declined. Bone density. Continue yearly mammograms. Pap not done.The new Pap smear  guidelines were discussed with the patient.

## 2011-11-15 NOTE — Patient Instructions (Signed)
Schedule bone density.    

## 2011-11-20 DIAGNOSIS — E1139 Type 2 diabetes mellitus with other diabetic ophthalmic complication: Secondary | ICD-10-CM | POA: Diagnosis not present

## 2011-12-18 ENCOUNTER — Other Ambulatory Visit: Payer: Self-pay | Admitting: Obstetrics and Gynecology

## 2011-12-18 DIAGNOSIS — M8580 Other specified disorders of bone density and structure, unspecified site: Secondary | ICD-10-CM

## 2011-12-19 ENCOUNTER — Ambulatory Visit: Payer: Medicare Other

## 2011-12-19 DIAGNOSIS — M8580 Other specified disorders of bone density and structure, unspecified site: Secondary | ICD-10-CM

## 2011-12-21 DIAGNOSIS — L259 Unspecified contact dermatitis, unspecified cause: Secondary | ICD-10-CM | POA: Diagnosis not present

## 2012-01-22 DIAGNOSIS — IMO0001 Reserved for inherently not codable concepts without codable children: Secondary | ICD-10-CM | POA: Diagnosis not present

## 2012-01-22 DIAGNOSIS — E7800 Pure hypercholesterolemia, unspecified: Secondary | ICD-10-CM | POA: Diagnosis not present

## 2012-01-22 DIAGNOSIS — R7301 Impaired fasting glucose: Secondary | ICD-10-CM | POA: Diagnosis not present

## 2012-02-18 ENCOUNTER — Encounter (HOSPITAL_COMMUNITY): Payer: Self-pay | Admitting: Nurse Practitioner

## 2012-02-18 ENCOUNTER — Emergency Department (HOSPITAL_COMMUNITY)
Admission: EM | Admit: 2012-02-18 | Discharge: 2012-02-18 | Disposition: A | Discharge status: Home / Self Care | Payer: Medicare Other | Attending: Emergency Medicine | Admitting: Emergency Medicine

## 2012-02-18 DIAGNOSIS — Z79899 Other long term (current) drug therapy: Secondary | ICD-10-CM | POA: Diagnosis not present

## 2012-02-18 DIAGNOSIS — Z8669 Personal history of other diseases of the nervous system and sense organs: Secondary | ICD-10-CM | POA: Diagnosis not present

## 2012-02-18 DIAGNOSIS — Z8709 Personal history of other diseases of the respiratory system: Secondary | ICD-10-CM | POA: Diagnosis not present

## 2012-02-18 DIAGNOSIS — R51 Headache: Secondary | ICD-10-CM | POA: Diagnosis not present

## 2012-02-18 DIAGNOSIS — Z8739 Personal history of other diseases of the musculoskeletal system and connective tissue: Secondary | ICD-10-CM | POA: Diagnosis not present

## 2012-02-18 DIAGNOSIS — Z8679 Personal history of other diseases of the circulatory system: Secondary | ICD-10-CM | POA: Diagnosis not present

## 2012-02-18 DIAGNOSIS — Z8742 Personal history of other diseases of the female genital tract: Secondary | ICD-10-CM | POA: Diagnosis not present

## 2012-02-18 DIAGNOSIS — E119 Type 2 diabetes mellitus without complications: Secondary | ICD-10-CM | POA: Diagnosis not present

## 2012-02-18 DIAGNOSIS — Z872 Personal history of diseases of the skin and subcutaneous tissue: Secondary | ICD-10-CM | POA: Diagnosis not present

## 2012-02-18 DIAGNOSIS — E039 Hypothyroidism, unspecified: Secondary | ICD-10-CM | POA: Insufficient documentation

## 2012-02-18 MED ORDER — PROMETHAZINE HCL 25 MG PO TABS: 25.0000 mg | ORAL_TABLET | Freq: Four times a day (QID) | ORAL | Status: DC | PRN

## 2012-02-18 MED ORDER — METOCLOPRAMIDE HCL 5 MG/ML IJ SOLN
10.0000 mg | Freq: Once | INTRAMUSCULAR | Status: AC
  Administered 2012-02-18: 10 mg via INTRAVENOUS
  Filled 2012-02-18: qty 2

## 2012-02-18 MED ORDER — SODIUM CHLORIDE 0.9 % IV SOLN
INTRAVENOUS | Status: DC
  Administered 2012-02-18: 14:00:00 via INTRAVENOUS

## 2012-02-18 MED ORDER — DIPHENHYDRAMINE HCL 50 MG/ML IJ SOLN
25.0000 mg | Freq: Once | INTRAMUSCULAR | Status: AC
  Administered 2012-02-18: 25 mg via INTRAVENOUS
  Filled 2012-02-18: qty 1

## 2012-02-18 MED ORDER — KETOROLAC TROMETHAMINE 30 MG/ML IJ SOLN
30.0000 mg | Freq: Once | INTRAMUSCULAR | Status: AC
Start: 2012-02-18 — End: 2012-02-18
  Administered 2012-02-18: 30 mg via INTRAVENOUS
  Filled 2012-02-18: qty 1

## 2012-02-18 NOTE — ED Notes (Signed)
PT ambulated with baseline gait; VSS; A&Ox3; no signs of distress; respirations even and unlabored; skin warm and dry; no questions upon discharge.  

## 2012-02-18 NOTE — ED Provider Notes (Signed)
History     CSN: [id number]  Arrival date & time 02/18/12  1203   First MD Initiated Contact with Patient 02/18/12 1250      Chief Complaint  Patient presents with  . Headache    (Consider location/radiation/quality/duration/timing/severity/associated sxs/prior treatment) HPI Comments: This is a 67 year old female, who presents emergency department with chief complaint of headache. Patient states that the headache began on Friday, and gradually worsened until today. She states that it is associated with photophobia and phonophobia. She denies any weakness, numbness, or tingling of the extremities. Denies any ataxia. She states that the headache feels like a belt is wrapped around her head and is squeezing. She states her level of discomfort is 10 out of 10. She is tried taking ibuprofen with no relief. She denies any visual deficits. Denies fever, neck stiffness, chest pain, shortness of breath.  The history is provided by the patient. No language interpreter was used.    Past Medical History  Diagnosis Date  . Osteoarthrosis, unspecified whether generalized or localized, unspecified site   . Raynaud's syndrome   . Urticaria, unspecified   . Other chronic allergic conjunctivitis   . Allergic rhinitis, cause unspecified   . Fibroid     Ruptured Left endometrioma  . Thyroid disease     Hypothyroid  . Endometriosis   . Diabetes mellitus without complication     Past Surgical History  Procedure Laterality Date  . Thyroid nodule    . Knot right breast    . Tonsillectomy    . Vesicovaginal fistula closure w/ tah    . Cholecystectomy    . Oophorectomy      LSO 84-RSO 04  . Vaginal hysterectomy  2004    [facility] RSO    Family History  Problem Relation Age of Onset  . Lung cancer Father   . Heart disease Father   . Heart failure Mother   . Dementia Mother     History  Substance Use Topics  . Smoking status: Never Smoker   . Smokeless tobacco: Never Used  . Alcohol Use:  Yes     Comment: RARE    OB History   Grav Para Term Preterm Abortions TAB SAB Ect Mult Living   1 1 1       1       Review of Systems  All other systems reviewed and are negative.    Allergies  Sulfonamide derivatives; Azithromycin; Clarithromycin; Codeine; Hydrocod polst-cpm polst er; Penicillins; Pseudoephedrine; and Shrimp  Home Medications   Current Outpatient Rx  Name  Route  Sig  Dispense  Refill  . amitriptyline (ELAVIL) 25 MG tablet   Oral   Take 25 mg by mouth at bedtime.           . cetirizine (ZYRTEC) 10 MG tablet   Oral   Take 10 mg by mouth daily.           . fluticasone (FLONASE) 50 MCG/ACT nasal spray   Nasal   Place 2 sprays into the nose daily.         Marland Kitchen ibuprofen (ADVIL,MOTRIN) 200 MG tablet   Oral   Take 800 mg by mouth every 6 (six) hours as needed for headache.         . levothyroxine (SYNTHROID, LEVOTHROID) 100 MCG tablet   Oral   Take 100 mcg by mouth daily.           . Lutein 20 MG CAPS   Oral  Take 1 capsule by mouth daily.           . Multiple Vitamin (MULTIVITAMIN) capsule   Oral   Take 1 capsule by mouth daily.           Marland Kitchen omeprazole (PRILOSEC) 20 MG capsule   Oral   Take 20 mg by mouth daily.             BP 131/85  Pulse 96  Temp(Src) 98 F (36.7 C) (Oral)  SpO2 98%  Physical Exam  Nursing note and vitals reviewed. Constitutional: She is oriented to person, place, and time. She appears well-developed and well-nourished.  HENT:  Head: Normocephalic and atraumatic.  Right Ear: External ear normal.  Left Ear: External ear normal.  Non-tender over temporal artery, no increased pain with chewing.  Eyes: Conjunctivae and EOM are normal. Pupils are equal, round, and reactive to light.  No papilledema  Neck: Normal range of motion. Neck supple.  No pain with neck flexion, no meningismus  Cardiovascular: Normal rate, regular rhythm and normal heart sounds.  Exam reveals no gallop and no friction rub.   No  murmur heard. Pulmonary/Chest: Effort normal and breath sounds normal. No respiratory distress. She has no wheezes. She has no rales. She exhibits no tenderness.  Abdominal: Soft. Bowel sounds are normal. She exhibits no distension and no mass. There is no tenderness. There is no rebound and no guarding.  Musculoskeletal: Normal range of motion. She exhibits no edema and no tenderness.  Normal gait.  Neurological: She is alert and oriented to person, place, and time. She has normal reflexes.  CN 3-12 intact, sensation and strength intact bilaterally.  Skin: Skin is warm and dry.  Psychiatric: She has a normal mood and affect. Her behavior is normal. Judgment and thought content normal.    ED Course  Procedures (including critical care time)  Labs Reviewed - No data to display No results found.   1. Headache       MDM  68 year old female with headache. Patient has not had any fevers, neck stiffness, weakness or neurologic deficits. She describes the pain as a band like sensation around her head, consistent with tension headache. The pain came on gradually, and has progressively worsened, it was not thunderclap in nature. I have given the patient a migraine cocktail, and she has improved significantly. She states that she is nearly pain free at this time. She states that she is ready to go home. I'm going to discharge the patient with some Phenergan. Return precautions have been given. Patient understands and agrees with the plan. She is stable and ready for discharge.        Roxy Horseman, PA-C 02/18/12 1457

## 2012-02-18 NOTE — ED Notes (Signed)
Pt reported that she is feeling better.
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
## 2012-02-18 NOTE — ED Provider Notes (Signed)
Medical screening examination/treatment/procedure(s) were performed by non-physician practitioner and as supervising physician I was immediately available for consultation/collaboration.  Cayle Cordoba T Toshie Demelo, MD 02/18/12 1513 

## 2012-04-17 ENCOUNTER — Ambulatory Visit: Payer: Medicare Other | Admitting: Internal Medicine

## 2012-05-22 DIAGNOSIS — IMO0001 Reserved for inherently not codable concepts without codable children: Secondary | ICD-10-CM | POA: Diagnosis not present

## 2012-05-22 DIAGNOSIS — Z1231 Encounter for screening mammogram for malignant neoplasm of breast: Secondary | ICD-10-CM | POA: Diagnosis not present

## 2012-06-03 ENCOUNTER — Ambulatory Visit (INDEPENDENT_AMBULATORY_CARE_PROVIDER_SITE_OTHER): Payer: Medicare Other | Admitting: Internal Medicine

## 2012-06-03 ENCOUNTER — Encounter: Payer: Self-pay | Admitting: Internal Medicine

## 2012-06-03 VITALS — BP 120/66 | HR 84 | Ht 63.0 in | Wt 199.0 lb

## 2012-06-03 DIAGNOSIS — J309 Allergic rhinitis, unspecified: Secondary | ICD-10-CM | POA: Diagnosis not present

## 2012-06-03 DIAGNOSIS — J302 Other seasonal allergic rhinitis: Secondary | ICD-10-CM

## 2012-06-03 NOTE — Progress Notes (Signed)
Patient ID: Jill Dean, female    DOB: 06/24/1945, 67 y.o.   MRN: [id number]  HPI 07/25/10- 72 yoF never smoker, followed for allergic rhinitis/ conjunctivitis complicated by hx Raynaud's synd, GERD, urticaria Last here January 23,2012  Since we gave neb and depo in January she reports doing well, with no significant problems through the change of seasons. Notes 'sometimes" drip or sniff. Takes zyrtec almost every day. This permits her to go in and out of doors with no problem. Has not had bronchitis.  10/14/10-  65 yoF never smoker, followed for allergic rhinitis/ conjunctivitis complicated by hx Raynaud's synd, GERD, urticaria She had to resume a regular uses ear to make for nasal congestion and drainage. Recent exposure to somebody sick lead to her having a URI/bronchitis syndrome 3 days later. Nasal congestion, chest congestion, sore throat, maybe some fever, blowing yellow-green.  04/17/11- 65 yoF never smoker, followed for allergic rhinitis/ conjunctivitis complicated by hx Raynaud's synd, GERD, urticaria Had a bronchitis last October which resolved. After that winter was good until current pollen season. Over the weekend developed right maxillary sinus pressure and pain.Using Neti pot. Can't tolerate decongestant stimulants because of blood pressure.  05/09/11-  66 yoF never smoker, followed for allergic rhinitis/ conjunctivitis complicated by hx Raynaud's synd, GERD, urticaria Pt c/o prod cough with large amounts of yellow sputum, sore throat, HA, chest tightness, chills and sweats> onset 1 day ago . Spent weekend at the beach if exposed wind and chills. Came home sick. Now starting doxycycline.  06/03/12-67 yoF never smoker, followed for allergic rhinitis/ conjunctivitis complicated by hx Raynaud's synd, GERD, urticaria, DM FOLLOWS FOR: breathing has been doing well. reports lots of congestion, runny nose and sneezing. denies SOB or chest tightness. Using Afrin nasal spray recently to help  sleep area and we discussed this carefully. 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 Increased nasal congestion, sneezing and blowing. Can't tolerate decongestant pills. Newly diagnosed with diabetes, diet controlled.  Review of Systems-See HPI Constitutional:   No-   weight loss, +night sweats, fevers, chills, fatigue, lassitude. HEENT:   No-  headaches, difficulty swallowing, tooth/dental problems, +sore throat,       + sneezing, itching, ear ache, +nasal congestion, +post nasal drip,  CV:  No-   chest pain, orthopnea, PND, swelling in lower extremities, anasarca,  dizziness, palpitations Resp: No-   shortness of breath with exertion or at rest.             No- productive cough,  No non-productive cough,  No-  coughing up of blood.              No- change in color of mucus.  No- wheezing.    Objective:   Physical Exam General- Alert, Oriented, Affect-appropriate, Distress- none acute  Overweight Skin- rash-none, lesions- none, excoriation- none Lymphadenopathy- none Head- atraumatic            Eyes- Gross vision intact, PERRLA, conjunctivae clear secretions, +periorbital edema            Ears- Hearing, canals-normal            Nose- +mucus, +turbinate edema, No-Septal dev,  polyps, erosion, perforation             Throat- Mallampati III-IV , mucosa clear-not red , drainage- none, tonsils- atrophic.  Neck- flexible , trachea midline, no stridor , thyroid nl, carotid no bruit Chest -            Lung-  clear to P&A but diminished, wheeze- none, cough- none , dullness-none, rub- none           Chest wall-  Abd-  Br/ Gen/ Rectal- Not done, not indicated Extrem- cyanosis- none, clubbing, none, atrophy- none, strength- nl Neuro- grossly intact to observation

## 2012-06-03 NOTE — Patient Instructions (Addendum)
Sample Dymista nasal spray    1-2 puffs each nostril once daily at bedtime    Try this instead of Flonase for comparison  Ok to use Zyrtec  Continue saline nasal rinses as needed

## 2012-06-14 ENCOUNTER — Encounter: Payer: Self-pay | Admitting: Internal Medicine

## 2012-06-14 NOTE — Assessment & Plan Note (Signed)
Seasonal exacerbation related to harvesting of hay fields near her home Plan-try Dymista nasal spray. Minimize use of Afrin

## 2012-07-05 ENCOUNTER — Telehealth: Payer: Self-pay | Admitting: Emergency Medicine

## 2012-07-05 MED ORDER — DOXYCYCLINE HYCLATE 50 MG PO CAPS: 100.0000 mg | ORAL_CAPSULE | Freq: Two times a day (BID) | ORAL | Status: DC

## 2012-07-05 NOTE — Telephone Encounter (Signed)
Calls with nasal congestion, fullness, yellow mucous. No F/C, N/V, viral prodrome. She had a sick contact from her daughter. Suspect this is a viral URI. Pt is asking specifically for doxycycline for an evolving sinusitis. No allergies.   Levy Pupa, MD, PhD 07/05/2012, 3:44 PM [facility] [phone] or if no answer [phone]

## 2012-07-08 ENCOUNTER — Telehealth: Payer: Self-pay | Admitting: Internal Medicine

## 2012-07-08 ENCOUNTER — Ambulatory Visit (INDEPENDENT_AMBULATORY_CARE_PROVIDER_SITE_OTHER): Payer: Medicare Other | Admitting: Internal Medicine

## 2012-07-08 ENCOUNTER — Encounter: Payer: Self-pay | Admitting: Internal Medicine

## 2012-07-08 VITALS — BP 120/78 | HR 82 | Ht 63.75 in | Wt 201.2 lb

## 2012-07-08 DIAGNOSIS — J018 Other acute sinusitis: Secondary | ICD-10-CM

## 2012-07-08 MED ORDER — PHENYLEPHRINE HCL 1 % NA SOLN
3.0000 [drp] | Freq: Once | NASAL | Status: AC
  Administered 2012-07-08: 3 [drp] via NASAL

## 2012-07-08 MED ORDER — METHYLPREDNISOLONE ACETATE 80 MG/ML IJ SUSP
80.0000 mg | Freq: Once | INTRAMUSCULAR | Status: AC
  Administered 2012-07-08: 80 mg via INTRAMUSCULAR

## 2012-07-08 NOTE — Telephone Encounter (Signed)
Pt c/o nasal congestion, sinus pressure, low grade temp, facial pain .  Sinus drainage  (green).  Pt called in this weekend and was started on Doxycycline.  Pt thinks she needs an shot and breathing treatment.  Offered a appt tomorrow but states she has to work.  Please advise. Allergies  Allergen Reactions  . Sulfonamide Derivatives Anaphylaxis and Swelling  . Azithromycin     REACTION: itching  . Clarithromycin     REACTION: severe itching  . Codeine Itching  . Hydrocod Polst-Cpm Polst Er Other (See Comments)    "hypes me up."  . Penicillins Itching  . Pseudoephedrine     REACTION: tachycardia  . Shrimp (Shellfish Allergy) Itching

## 2012-07-08 NOTE — Progress Notes (Signed)
Patient ID: Jill Dean, female    DOB: 1945-10-30, 67 y.o.   MRN: [id number]  HPI 07/25/10- 55 yoF never smoker, followed for allergic rhinitis/ conjunctivitis complicated by hx Raynaud's synd, GERD, urticaria Last here January 23,2012  Since we gave neb and depo in January she reports doing well, with no significant problems through the change of seasons. Notes 'sometimes" drip or sniff. Takes zyrtec almost every day. This permits her to go in and out of doors with no problem. Has not had bronchitis.  10/14/10-  65 yoF never smoker, followed for allergic rhinitis/ conjunctivitis complicated by hx Raynaud's synd, GERD, urticaria She had to resume a regular uses ear to make for nasal congestion and drainage. Recent exposure to somebody sick lead to her having a URI/bronchitis syndrome 3 days later. Nasal congestion, chest congestion, sore throat, maybe some fever, blowing yellow-green.  04/17/11- 65 yoF never smoker, followed for allergic rhinitis/ conjunctivitis complicated by hx Raynaud's synd, GERD, urticaria Had a bronchitis last October which resolved. After that winter was good until current pollen season. Over the weekend developed right maxillary sinus pressure and pain.Using Neti pot. Can't tolerate decongestant stimulants because of blood pressure.  05/09/11-  66 yoF never smoker, followed for allergic rhinitis/ conjunctivitis complicated by hx Raynaud's synd, GERD, urticaria Pt c/o prod cough with large amounts of yellow sputum, sore throat, HA, chest tightness, chills and sweats> onset 1 day ago . Spent weekend at the beach if exposed wind and chills. Came home sick. Now starting doxycycline.  06/03/12-67 yoF never smoker, followed for allergic rhinitis/ conjunctivitis complicated by hx Raynaud's synd, GERD, urticaria, DM FOLLOWS FOR: breathing has been doing well. reports lots of congestion, runny nose and sneezing. denies SOB or chest tightness. Using Afrin nasal spray recently to help  sleep area and we discussed this carefully. 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 Increased nasal congestion, sneezing and blowing. Can't tolerate decongestant pills. Newly diagnosed with diabetes, diet controlled.  07/08/12-67 yoF never smoker, followed for allergic rhinitis/ conjunctivitis complicated by hx Raynaud's synd, GERD, urticaria, DM ACUTE VISIT: pt c/o congested head, pnd,f/c/s, cough w green nasal drainage, denies any SOB  x 3 days started doxy 2 days ago w no improvement  Frontal HA, no fever. Exposed to sick daughter.  Review of Systems-See HPI Constitutional:   No-   weight loss, night sweats, fevers, chills, fatigue, lassitude. HEENT:   + headaches, difficulty swallowing, tooth/dental problems, +sore throat,       sneezing, itching, ear ache, +nasal congestion, +post nasal drip,  CV:  No-   chest pain, orthopnea, PND, swelling in lower extremities, anasarca,  dizziness, palpitations Resp: No-   shortness of breath with exertion or at rest.             No- productive cough,  No non-productive cough,  No-  coughing up of blood.              No- change in color of mucus.  No- wheezing.    Objective:   Physical Exam General- Alert, Oriented, Affect-appropriate, Distress- none acute  Overweight Skin- rash-none, lesions- none, excoriation- none Lymphadenopathy- none Head- atraumatic            Eyes- Gross vision intact, PERRLA, conjunctivae clear secretions, +periorbital edema            Ears- Hearing, canals-normal            Nose- +mucus, +turbinate edema, No-Septal dev,  polyps, erosion, perforation  Throat- Mallampati III-IV , mucosa clear-not red , drainage- none, tonsils- atrophic.  Neck- flexible , trachea midline, no stridor , thyroid nl, carotid no bruit Chest -            Lung- clear to P&A but diminished, wheeze- none, cough- none , dullness-none, rub- none           Chest wall-  Abd-  Br/ Gen/ Rectal- Not done, not indicated Extrem- cyanosis- none,  clubbing, none, atrophy- none, strength- nl Neuro- grossly intact to observation

## 2012-07-08 NOTE — Assessment & Plan Note (Signed)
Recurrent acute frontal and maxillary sinusitis. Plan- finish dxoy. Neb neo nasal, depomedrol, Neti pot

## 2012-07-08 NOTE — Telephone Encounter (Signed)
Spoke with patient, patient has been scheduled to be seen today with Dr. Maple Hudson @ 11am Nothing further needed at this time

## 2012-07-08 NOTE — Telephone Encounter (Signed)
Per CY-- ok to work in on schedule for acute sinusitis

## 2012-07-08 NOTE — Patient Instructions (Addendum)
Ok to finish the doxycycline  Neb neo nasal  Depo medrol   It can help to rinse with saline in your nose

## 2012-07-23 DIAGNOSIS — H02409 Unspecified ptosis of unspecified eyelid: Secondary | ICD-10-CM | POA: Diagnosis not present

## 2012-07-23 DIAGNOSIS — H0230 Blepharochalasis unspecified eye, unspecified eyelid: Secondary | ICD-10-CM | POA: Diagnosis not present

## 2012-07-23 DIAGNOSIS — H269 Unspecified cataract: Secondary | ICD-10-CM | POA: Diagnosis not present

## 2012-08-20 ENCOUNTER — Other Ambulatory Visit: Payer: Self-pay | Admitting: Internal Medicine

## 2012-08-23 ENCOUNTER — Telehealth: Payer: Self-pay | Admitting: Internal Medicine

## 2012-08-23 NOTE — Telephone Encounter (Signed)
Rx refill sent to drugstore and patient aware.

## 2012-09-09 ENCOUNTER — Ambulatory Visit (INDEPENDENT_AMBULATORY_CARE_PROVIDER_SITE_OTHER): Payer: Medicare Other | Admitting: Internal Medicine

## 2012-09-09 ENCOUNTER — Encounter: Payer: Self-pay | Admitting: Internal Medicine

## 2012-09-09 VITALS — BP 120/74 | HR 100 | Ht 63.75 in | Wt 202.8 lb

## 2012-09-09 DIAGNOSIS — J018 Other acute sinusitis: Secondary | ICD-10-CM | POA: Diagnosis not present

## 2012-09-09 MED ORDER — LEVALBUTEROL HCL 0.63 MG/3ML IN NEBU
0.6300 mg | INHALATION_SOLUTION | Freq: Once | RESPIRATORY_TRACT | Status: AC
  Administered 2012-09-09: 0.63 mg via RESPIRATORY_TRACT

## 2012-09-09 MED ORDER — DOXYCYCLINE HYCLATE 50 MG PO CAPS: ORAL_CAPSULE | ORAL | Status: DC

## 2012-09-09 MED ORDER — METHYLPREDNISOLONE ACETATE 80 MG/ML IJ SUSP
80.0000 mg | Freq: Once | INTRAMUSCULAR | Status: AC
  Administered 2012-09-09: 80 mg via INTRAMUSCULAR

## 2012-09-09 NOTE — Progress Notes (Signed)
Patient ID: Jill Dean, female    DOB: Jul 08, 1945, 67 y.o.   MRN: [id number]  HPI 07/25/10- 62 yoF never smoker, followed for allergic rhinitis/ conjunctivitis complicated by hx Raynaud's synd, GERD, urticaria Last here January 23,2012  Since we gave neb and depo in January she reports doing well, with no significant problems through the change of seasons. Notes 'sometimes" drip or sniff. Takes zyrtec almost every day. This permits her to go in and out of doors with no problem. Has not had bronchitis.  10/14/10-  65 yoF never smoker, followed for allergic rhinitis/ conjunctivitis complicated by hx Raynaud's synd, GERD, urticaria She had to resume a regular uses ear to make for nasal congestion and drainage. Recent exposure to somebody sick lead to her having a URI/bronchitis syndrome 3 days later. Nasal congestion, chest congestion, sore throat, maybe some fever, blowing yellow-green.  04/17/11- 65 yoF never smoker, followed for allergic rhinitis/ conjunctivitis complicated by hx Raynaud's synd, GERD, urticaria Had a bronchitis last October which resolved. After that winter was good until current pollen season. Over the weekend developed right maxillary sinus pressure and pain.Using Neti pot. Can't tolerate decongestant stimulants because of blood pressure.  05/09/11-  66 yoF never smoker, followed for allergic rhinitis/ conjunctivitis complicated by hx Raynaud's synd, GERD, urticaria Pt c/o prod cough with large amounts of yellow sputum, sore throat, HA, chest tightness, chills and sweats> onset 1 day ago . Spent weekend at the beach if exposed wind and chills. Came home sick. Now starting doxycycline.  06/03/12-67 yoF never smoker, followed for allergic rhinitis/ conjunctivitis complicated by hx Raynaud's synd, GERD, urticaria, DM FOLLOWS FOR: breathing has been doing well. reports lots of congestion, runny nose and sneezing. denies SOB or chest tightness. Using Afrin nasal spray recently to help  sleep area and we discussed this carefully. 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 Increased nasal congestion, sneezing and blowing. Can't tolerate decongestant pills. Newly diagnosed with diabetes, diet controlled.  07/08/12-67 yoF never smoker, followed for allergic rhinitis/ conjunctivitis complicated by hx Raynaud's synd, GERD, urticaria, DM ACUTE VISIT: pt c/o congested head, pnd,f/c/s, cough w green nasal drainage, denies any SOB  x 3 days started doxy 2 days ago w no improvement  Frontal HA, no fever. Exposed to sick daughter.  09/09/12- 67 yoF never smoker, followed for allergic rhinitis/ conjunctivitis complicated by hx Raynaud's synd, GERD, urticaria, DM ACUTE VISIT: 3 nights ago was out in "night air". Next day started having a "raw" throat. Since then has chest congestion/tightness; cough, and SOB.low-grade fever yesterday. Cough, productive yellow green.  Review of Systems-See HPI Constitutional:   No-   weight loss, night sweats, fevers, chills, fatigue, lassitude. HEENT:   + headaches, difficulty swallowing, tooth/dental problems, +sore throat,       sneezing, itching, ear ache, +nasal congestion, +post nasal drip,  CV:  No-   chest pain, orthopnea, PND, swelling in lower extremities, anasarca,  dizziness, palpitations Resp: No-   shortness of breath with exertion or at rest.             + productive cough,  No non-productive cough,  No-  coughing up of blood.              + change in color of mucus.  No- wheezing.    Objective:   Physical Exam General- Alert, Oriented, Affect-appropriate, Distress- none acute  Overweight Skin- rash-none, lesions- none, excoriation- none Lymphadenopathy- none Head- atraumatic  Eyes- Gross vision intact, PERRLA, conjunctivae clear secretions, +periorbital edema            Ears- Hearing, canals-normal            Nose- +mucus, +turbinate edema, No-Septal dev,  polyps, erosion, perforation, + raspy voice            Throat- Mallampati  III-IV , mucosa clear-not red , drainage- none, tonsils- atrophic.  Neck- flexible , trachea midline, no stridor , thyroid nl, carotid no bruit Chest -            Lung- clear to P&A but diminished, wheeze- none, cough- none , dullness-none, rub- none           Chest wall-  Abd-  Br/ Gen/ Rectal- Not done, not indicated Extrem- cyanosis- none, clubbing, none, atrophy- none, strength- nl Neuro- grossly intact to observation

## 2012-09-09 NOTE — Patient Instructions (Addendum)
Neb Xop 0.63  Depo 80  Script sent for doxycycline  Get plenty of fluids and as much rest as you can. Don't get caught in the rain and chilled.

## 2012-09-15 NOTE — Assessment & Plan Note (Signed)
Acute upper respiratory infection with rhinitis and laryngitis/tracheitis Plan-Depo-Medrol, nebulizer Xopenex, doxycycline

## 2012-09-25 DIAGNOSIS — M899 Disorder of bone, unspecified: Secondary | ICD-10-CM | POA: Diagnosis not present

## 2012-09-25 DIAGNOSIS — E041 Nontoxic single thyroid nodule: Secondary | ICD-10-CM | POA: Diagnosis not present

## 2012-09-25 DIAGNOSIS — E119 Type 2 diabetes mellitus without complications: Secondary | ICD-10-CM | POA: Diagnosis not present

## 2012-09-25 DIAGNOSIS — Z0000 Encounter for general adult medical examination without abnormal findings: Secondary | ICD-10-CM | POA: Diagnosis not present

## 2012-10-03 DIAGNOSIS — H02409 Unspecified ptosis of unspecified eyelid: Secondary | ICD-10-CM | POA: Diagnosis not present

## 2012-10-03 DIAGNOSIS — H269 Unspecified cataract: Secondary | ICD-10-CM | POA: Diagnosis not present

## 2012-10-03 DIAGNOSIS — H0230 Blepharochalasis unspecified eye, unspecified eyelid: Secondary | ICD-10-CM | POA: Diagnosis not present

## 2012-11-11 DIAGNOSIS — E119 Type 2 diabetes mellitus without complications: Secondary | ICD-10-CM | POA: Diagnosis not present

## 2012-11-20 DIAGNOSIS — H02409 Unspecified ptosis of unspecified eyelid: Secondary | ICD-10-CM | POA: Diagnosis not present

## 2012-11-20 DIAGNOSIS — H5340 Unspecified visual field defects: Secondary | ICD-10-CM | POA: Diagnosis not present

## 2012-11-20 DIAGNOSIS — E119 Type 2 diabetes mellitus without complications: Secondary | ICD-10-CM | POA: Diagnosis not present

## 2012-11-20 DIAGNOSIS — I10 Essential (primary) hypertension: Secondary | ICD-10-CM | POA: Diagnosis not present

## 2012-11-20 DIAGNOSIS — H0230 Blepharochalasis unspecified eye, unspecified eyelid: Secondary | ICD-10-CM | POA: Diagnosis not present

## 2012-11-26 DIAGNOSIS — H0230 Blepharochalasis unspecified eye, unspecified eyelid: Secondary | ICD-10-CM | POA: Diagnosis not present

## 2012-11-26 DIAGNOSIS — H02409 Unspecified ptosis of unspecified eyelid: Secondary | ICD-10-CM | POA: Diagnosis not present

## 2012-12-20 ENCOUNTER — Telehealth: Payer: Self-pay | Admitting: Internal Medicine

## 2012-12-20 MED ORDER — DOXYCYCLINE HYCLATE 50 MG PO CAPS: ORAL_CAPSULE | ORAL | Status: DC

## 2012-12-20 NOTE — Telephone Encounter (Signed)
Pt states she started having yellow/green with blood tinged mucus from her nose yesterday. Pt was seen last 09-09-12 for similar symptoms-was given Doxycycline Rx and felt that helped a lot. Pt would like the same Rx this time. CY please advise. Thanks.   Allergies  Allergen Reactions  . Sulfonamide Derivatives Anaphylaxis and Swelling  . Azithromycin     REACTION: itching  . Clarithromycin     REACTION: severe itching  . Codeine Itching  . Hydrocod Polst-Cpm Polst Er Other (See Comments)    "hypes me up."  . Lidocaine     "makes me shake"  . Penicillins Itching  . Pseudoephedrine     REACTION: tachycardia  . Shrimp [Shellfish Allergy] Itching   Next OV is 06-04-13

## 2012-12-20 NOTE — Telephone Encounter (Signed)
Ok to refill the 9/18 doxycycline script

## 2012-12-20 NOTE — Telephone Encounter (Signed)
i spoke with pt. Aware of recs. rx sent

## 2012-12-31 DIAGNOSIS — H02409 Unspecified ptosis of unspecified eyelid: Secondary | ICD-10-CM | POA: Diagnosis not present

## 2012-12-31 DIAGNOSIS — Z4889 Encounter for other specified surgical aftercare: Secondary | ICD-10-CM | POA: Diagnosis not present

## 2012-12-31 DIAGNOSIS — H0230 Blepharochalasis unspecified eye, unspecified eyelid: Secondary | ICD-10-CM | POA: Diagnosis not present

## 2013-02-26 ENCOUNTER — Ambulatory Visit (INDEPENDENT_AMBULATORY_CARE_PROVIDER_SITE_OTHER): Payer: Medicare Other | Admitting: Women's Health

## 2013-02-26 ENCOUNTER — Encounter: Payer: Self-pay | Admitting: Women's Health

## 2013-02-26 VITALS — BP 126/70 | Ht 63.0 in | Wt 208.0 lb

## 2013-02-26 DIAGNOSIS — M949 Disorder of cartilage, unspecified: Secondary | ICD-10-CM | POA: Diagnosis not present

## 2013-02-26 DIAGNOSIS — M899 Disorder of bone, unspecified: Secondary | ICD-10-CM

## 2013-02-26 DIAGNOSIS — M8580 Other specified disorders of bone density and structure, unspecified site: Secondary | ICD-10-CM

## 2013-02-26 NOTE — Patient Instructions (Signed)
Health Recommendations for Postmenopausal Women Respected and ongoing research has looked at the most common causes of death, disability, and poor quality of life in postmenopausal women. The causes include heart disease, diseases of blood vessels, diabetes, depression, cancer, and bone loss (osteoporosis). Many things can be done to help lower the chances of developing these and other common problems: CARDIOVASCULAR DISEASE Heart Disease: A heart attack is a medical emergency. Know the signs and symptoms of a heart attack. Below are things women can do to reduce their risk for heart disease.   Do not smoke. If you smoke, quit.  Aim for a healthy weight. Being overweight causes many preventable deaths. Eat a healthy and balanced diet and drink an adequate amount of liquids.  Get moving. Make a commitment to be more physically active. Aim for 30 minutes of activity on most, if not all days of the week.  Eat for heart health. Choose a diet that is low in saturated fat and cholesterol and eliminate trans fat. Include whole grains, vegetables, and fruits. Read and understand the labels on food containers before buying.  Know your numbers. Ask your caregiver to check your blood pressure, cholesterol (total, HDL, LDL, triglycerides) and blood glucose. Work with your caregiver on improving your entire clinical picture.  High blood pressure. Limit or stop your table salt intake (try salt substitute and food seasonings). Avoid salty foods and drinks. Read labels on food containers before buying. Eating well and exercising can help control high blood pressure. STROKE  Stroke is a medical emergency. Stroke may be the result of a blood clot in a blood vessel in the brain or by a brain hemorrhage (bleeding). Know the signs and symptoms of a stroke. To lower the risk of developing a stroke:  Avoid fatty foods.  Quit smoking.  Control your diabetes, blood pressure, and irregular heart rate. THROMBOPHLEBITIS  (BLOOD CLOT) OF THE LEG  Becoming overweight and leading a stationary lifestyle may also contribute to developing blood clots. Controlling your diet and exercising will help lower the risk of developing blood clots. CANCER SCREENING  Breast Cancer: Take steps to reduce your risk of breast cancer.  You should practice "breast self-awareness." This means understanding the normal appearance and feel of your breasts and should include breast self-examination. Any changes detected, no matter how small, should be reported to your caregiver.  After age 40, you should have a clinical breast exam (CBE) every year.  Starting at age 40, you should consider having a mammogram (breast X-ray) every year.  If you have a family history of breast cancer, talk to your caregiver about genetic screening.  If you are at high risk for breast cancer, talk to your caregiver about having an MRI and a mammogram every year.  Intestinal or Stomach Cancer: Tests to consider are a rectal exam, fecal occult blood, sigmoidoscopy, and colonoscopy. Women who are high risk may need to be screened at an earlier age and more often.  Cervical Cancer:  Beginning at age 30, you should have a Pap test every 3 years as long as the past 3 Pap tests have been normal.  If you have had past treatment for cervical cancer or a condition that could lead to cancer, you need Pap tests and screening for cancer for at least 20 years after your treatment.  If you had a hysterectomy for a problem that was not cancer or a condition that could lead to cancer, then you no longer need Pap tests.    If you are between ages 65 and 70, and you have had normal Pap tests going back 10 years, you no longer need Pap tests.  If Pap tests have been discontinued, risk factors (such as a new sexual partner) need to be reassessed to determine if screening should be resumed.  Some medical problems can increase the chance of getting cervical cancer. In these  cases, your caregiver may recommend more frequent screening and Pap tests.  Uterine Cancer: If you have vaginal bleeding after reaching menopause, you should notify your caregiver.  Ovarian cancer: Other than yearly pelvic exams, there are no reliable tests available to screen for ovarian cancer at this time except for yearly pelvic exams.  Lung Cancer: Yearly chest X-rays can detect lung cancer and should be done on high risk women, such as cigarette smokers and women with chronic lung disease (emphysema).  Skin Cancer: A complete body skin exam should be done at your yearly examination. Avoid overexposure to the sun and ultraviolet light lamps. Use a strong sun block cream when in the sun. All of these things are important in lowering the risk of skin cancer. MENOPAUSE Menopause Symptoms: Hormone therapy products are effective for treating symptoms associated with menopause:  Moderate to severe hot flashes.  Night sweats.  Mood swings.  Headaches.  Tiredness.  Loss of sex drive.  Insomnia.  Other symptoms. Hormone replacement carries certain risks, especially in older women. Women who use or are thinking about using estrogen or estrogen with progestin treatments should discuss that with their caregiver. Your caregiver will help you understand the benefits and risks. The ideal dose of hormone replacement therapy is not known. The Food and Drug Administration (FDA) has concluded that hormone therapy should be used only at the lowest doses and for the shortest amount of time to reach treatment goals.  OSTEOPOROSIS Protecting Against Bone Loss and Preventing Fracture: If you use hormone therapy for prevention of bone loss (osteoporosis), the risks for bone loss must outweigh the risk of the therapy. Ask your caregiver about other medications known to be safe and effective for preventing bone loss and fractures. To guard against bone loss or fractures, the following is recommended:  If  you are less than age 50, take 1000 mg of calcium and at least 600 mg of Vitamin D per day.  If you are greater than age 50 but less than age 70, take 1200 mg of calcium and at least 600 mg of Vitamin D per day.  If you are greater than age 70, take 1200 mg of calcium and at least 800 mg of Vitamin D per day. Smoking and excessive alcohol intake increases the risk of osteoporosis. Eat foods rich in calcium and vitamin D and do weight bearing exercises several times a week as your caregiver suggests. DIABETES Diabetes Melitus: If you have Type I or Type 2 diabetes, you should keep your blood sugar under control with diet, exercise and recommended medication. Avoid too many sweets, starchy and fatty foods. Being overweight can make control more difficult. COGNITION AND MEMORY Cognition and Memory: Menopausal hormone therapy is not recommended for the prevention of cognitive disorders such as Alzheimer's disease or memory loss.  DEPRESSION  Depression may occur at any age, but is common in elderly women. The reasons may be because of physical, medical, social (loneliness), or financial problems and needs. If you are experiencing depression because of medical problems and control of symptoms, talk to your caregiver about this. Physical activity and   exercise may help with mood and sleep. Community and volunteer involvement may help your sense of value and worth. If you have depression and you feel that the problem is getting worse or becoming severe, talk to your caregiver about treatment options that are best for you. ACCIDENTS  Accidents are common and can be serious in the elderly woman. Prepare your house to prevent accidents. Eliminate throw rugs, place hand bars in the bath, shower and toilet areas. Avoid wearing high heeled shoes or walking on wet, snowy, and icy areas. Limit or stop driving if you have vision or hearing problems, or you feel you are unsteady with you movements and  reflexes. HEPATITIS C Hepatitis C is a type of viral infection affecting the liver. It is spread mainly through contact with blood from an infected person. It can be treated, but if left untreated, it can lead to severe liver damage over years. Many people who are infected do not know that the virus is in their blood. If you are a "baby-boomer", it is recommended that you have one screening test for Hepatitis C. IMMUNIZATIONS  Several immunizations are important to consider having during your senior years, including:   Tetanus, diptheria, and pertussis booster shot.  Influenza every year before the flu season begins.  Pneumonia vaccine.  Shingles vaccine.  Others as indicated based on your specific needs. Talk to your caregiver about these. Document Released: 02/10/2005 Document Revised: 12/06/2011 Document Reviewed: 10/07/2007 [employer] Patient Information 2014 [employer].  

## 2013-02-26 NOTE — Progress Notes (Signed)
MIESHA BACHMANN Feb 10, 1945 [id number]    History:    Presents for breast and pelvic exam. LAVH with RSO in 2004 LSO 84. History of a vesicovaginal fistula. Not sexually active/husbands health. I was going to at spine on to make sure that you and your indications Osteopenia 12/2011 DEXA T score -1.6 right femoral neck  FRAX 14%/1.7%. Currently walking and using weights. Diabetes/hypothyroid-primary care manages. Normal colonoscopy. Normal Pap and mammogram history.   Past medical history, past surgical history, family history and social history were all reviewed and documented in the EPIC chart. 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   Exam:  Filed Vitals:   02/26/13 1418  BP: 126/70    General appearance:  Normal Thyroid:  Symmetrical, normal in size, without palpable masses or nodularity. Respiratory  Auscultation:  Clear without wheezing or rhonchi Cardiovascular  Auscultation:  Regular rate, without rubs, murmurs or gallops  Edema/varicosities:  Not grossly evident Abdominal  Soft,nontender, without masses, guarding or rebound.  Liver/spleen:  No organomegaly noted  Hernia:  None appreciated  Skin  Inspection:  Grossly normal   Breasts: Examined lying and sitting.     Right: Without masses, retractions, discharge or axillary adenopathy.     Left: Without masses, retractions, discharge or axillary adenopathy. Gentitourinary   Inguinal/mons:  Normal without inguinal adenopathy  External genitalia:  Normal  BUS/Urethra/Skene's glands:  Normal  Vagina:  Normal  Cervix:  Absent Uterus:  Absent  Adnexa/parametria:     Rt: Without masses or tenderness.   Lt: Without masses or tenderness.  Anus and perineum: Normal  Digital rectal exam: Normal sphincter tone without palpated masses or tenderness  Assessment/Plan:  68 y.o. MWF G1P1 for breast and pelvic exam.  TVH with BSO no HRT Diabetes//osteopenia/ hypothyroid primary care manages labs and meds  Plan: SBE's, continue annual  mammogram, calcium rich diet, vitamin D 2000 daily. Continue exercise and decrease calories for weight loss. Home safety and fall prevention discussed. Reviewed importance of continuing regular exercise and weight training. New Pap screening  reviewed.   Huel Cote [facility], 2:47 PM 02/26/2013

## 2013-02-28 ENCOUNTER — Encounter: Payer: Self-pay | Admitting: Obstetrics and Gynecology

## 2013-03-04 DIAGNOSIS — H0289 Other specified disorders of eyelid: Secondary | ICD-10-CM | POA: Diagnosis not present

## 2013-03-04 DIAGNOSIS — Z9889 Other specified postprocedural states: Secondary | ICD-10-CM | POA: Diagnosis not present

## 2013-03-04 DIAGNOSIS — H02409 Unspecified ptosis of unspecified eyelid: Secondary | ICD-10-CM | POA: Diagnosis not present

## 2013-03-04 DIAGNOSIS — H0230 Blepharochalasis unspecified eye, unspecified eyelid: Secondary | ICD-10-CM | POA: Diagnosis not present

## 2013-03-14 DIAGNOSIS — Z884 Allergy status to anesthetic agent status: Secondary | ICD-10-CM | POA: Diagnosis not present

## 2013-03-14 DIAGNOSIS — Z91013 Allergy to seafood: Secondary | ICD-10-CM | POA: Diagnosis not present

## 2013-03-14 DIAGNOSIS — Z809 Family history of malignant neoplasm, unspecified: Secondary | ICD-10-CM | POA: Diagnosis not present

## 2013-03-14 DIAGNOSIS — Z885 Allergy status to narcotic agent status: Secondary | ICD-10-CM | POA: Diagnosis not present

## 2013-03-14 DIAGNOSIS — K219 Gastro-esophageal reflux disease without esophagitis: Secondary | ICD-10-CM | POA: Diagnosis not present

## 2013-03-14 DIAGNOSIS — E119 Type 2 diabetes mellitus without complications: Secondary | ICD-10-CM | POA: Diagnosis not present

## 2013-03-14 DIAGNOSIS — Z881 Allergy status to other antibiotic agents status: Secondary | ICD-10-CM | POA: Diagnosis not present

## 2013-03-14 DIAGNOSIS — Z83518 Family history of other specified eye disorder: Secondary | ICD-10-CM | POA: Diagnosis not present

## 2013-03-14 DIAGNOSIS — Z8249 Family history of ischemic heart disease and other diseases of the circulatory system: Secondary | ICD-10-CM | POA: Diagnosis not present

## 2013-03-14 DIAGNOSIS — E890 Postprocedural hypothyroidism: Secondary | ICD-10-CM | POA: Diagnosis not present

## 2013-03-14 DIAGNOSIS — Z880 Allergy status to penicillin: Secondary | ICD-10-CM | POA: Diagnosis not present

## 2013-03-14 DIAGNOSIS — E039 Hypothyroidism, unspecified: Secondary | ICD-10-CM | POA: Diagnosis not present

## 2013-03-14 DIAGNOSIS — Z888 Allergy status to other drugs, medicaments and biological substances status: Secondary | ICD-10-CM | POA: Diagnosis not present

## 2013-03-14 DIAGNOSIS — J309 Allergic rhinitis, unspecified: Secondary | ICD-10-CM | POA: Diagnosis not present

## 2013-03-14 DIAGNOSIS — H02409 Unspecified ptosis of unspecified eyelid: Secondary | ICD-10-CM | POA: Diagnosis not present

## 2013-03-14 DIAGNOSIS — Z823 Family history of stroke: Secondary | ICD-10-CM | POA: Diagnosis not present

## 2013-03-19 DIAGNOSIS — IMO0001 Reserved for inherently not codable concepts without codable children: Secondary | ICD-10-CM | POA: Diagnosis not present

## 2013-03-20 DIAGNOSIS — Z9889 Other specified postprocedural states: Secondary | ICD-10-CM | POA: Diagnosis not present

## 2013-03-20 DIAGNOSIS — Z48810 Encounter for surgical aftercare following surgery on the sense organs: Secondary | ICD-10-CM | POA: Diagnosis not present

## 2013-05-28 DIAGNOSIS — L219 Seborrheic dermatitis, unspecified: Secondary | ICD-10-CM | POA: Diagnosis not present

## 2013-05-28 DIAGNOSIS — L570 Actinic keratosis: Secondary | ICD-10-CM | POA: Diagnosis not present

## 2013-05-28 DIAGNOSIS — L659 Nonscarring hair loss, unspecified: Secondary | ICD-10-CM | POA: Diagnosis not present

## 2013-05-28 DIAGNOSIS — Z1231 Encounter for screening mammogram for malignant neoplasm of breast: Secondary | ICD-10-CM | POA: Diagnosis not present

## 2013-06-04 ENCOUNTER — Ambulatory Visit (INDEPENDENT_AMBULATORY_CARE_PROVIDER_SITE_OTHER): Payer: Medicare Other | Admitting: Internal Medicine

## 2013-06-04 ENCOUNTER — Encounter: Payer: Self-pay | Admitting: Internal Medicine

## 2013-06-04 VITALS — BP 126/76 | HR 77 | Ht 63.75 in | Wt 212.6 lb

## 2013-06-04 DIAGNOSIS — H1045 Other chronic allergic conjunctivitis: Secondary | ICD-10-CM

## 2013-06-04 DIAGNOSIS — L509 Urticaria, unspecified: Secondary | ICD-10-CM

## 2013-06-04 DIAGNOSIS — J309 Allergic rhinitis, unspecified: Secondary | ICD-10-CM

## 2013-06-04 DIAGNOSIS — J3089 Other allergic rhinitis: Principal | ICD-10-CM

## 2013-06-04 DIAGNOSIS — J302 Other seasonal allergic rhinitis: Secondary | ICD-10-CM

## 2013-06-04 MED ORDER — DOXYCYCLINE HYCLATE 50 MG PO CAPS: ORAL_CAPSULE | ORAL | Status: DC

## 2013-06-04 NOTE — Patient Instructions (Signed)
Script sent for doxycycline  Please call as needed

## 2013-06-04 NOTE — Progress Notes (Signed)
Patient ID: Jill Dean, female    DOB: [birth year], 68 y.o.   MRN: [id number]  HPI 07/25/10- 65 yoF never smoker, followed for allergic rhinitis/ conjunctivitis complicated by hx Raynaud's synd, GERD, urticaria Last here January 23,2012  Since we gave neb and depo in January she reports doing well, with no significant problems through the change of seasons. Notes 'sometimes" drip or sniff. Takes zyrtec almost every day. This permits her to go in and out of doors with no problem. Has not had bronchitis.  10/14/10-  45 yoF never smoker, followed for allergic rhinitis/ conjunctivitis complicated by hx Raynaud's synd, GERD, urticaria She had to resume a regular uses ear to make for nasal congestion and drainage. Recent exposure to somebody sick lead to her having a URI/bronchitis syndrome 3 days later. Nasal congestion, chest congestion, sore throat, maybe some fever, blowing yellow-green.  04/17/11- 35 yoF never smoker, followed for allergic rhinitis/ conjunctivitis complicated by hx Raynaud's synd, GERD, urticaria Had a bronchitis last October which resolved. After that winter was good until current pollen season. Over the weekend developed right maxillary sinus pressure and pain.Using Neti pot. Can't tolerate decongestant stimulants because of blood pressure.  05/09/11-  66 yoF never smoker, followed for allergic rhinitis/ conjunctivitis complicated by hx Raynaud's synd, GERD, urticaria Pt c/o prod cough with large amounts of yellow sputum, sore throat, HA, chest tightness, chills and sweats> onset 1 day ago . Spent weekend at the beach if exposed wind and chills. Came home sick. Now starting doxycycline.  06/03/12-67 yoF never smoker, followed for allergic rhinitis/ conjunctivitis complicated by hx Raynaud's synd, GERD, urticaria, DM FOLLOWS FOR: breathing has been doing well. reports lots of congestion, runny nose and sneezing. denies SOB or chest tightness. Using Afrin nasal spray recently to help  sleep area and we discussed this carefully. 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 Increased nasal congestion, sneezing and blowing. Can't tolerate decongestant pills. Newly diagnosed with diabetes, diet controlled.  07/08/12-67 yoF never smoker, followed for allergic rhinitis/ conjunctivitis complicated by hx Raynaud's synd, GERD, urticaria, DM ACUTE VISIT: pt c/o congested head, pnd,f/c/s, cough w green nasal drainage, denies any SOB  x 3 days started doxy 2 days ago w no improvement  Frontal HA, no fever. Exposed to sick daughter.  09/09/12- 67 yoF never smoker, followed for allergic rhinitis/ conjunctivitis complicated by hx Raynaud's synd, GERD, urticaria, DM ACUTE VISIT: 3 nights ago was out in "night air". Next day started having a "raw" throat. Since then has chest congestion/tightness; cough, and SOB.low-grade fever yesterday. Cough, productive yellow green.  06/04/13- 67 yoF never smoker, followed for allergic rhinitis/ conjunctivitis complicated by hx Raynaud's synd, GERD, urticaria, DM FOLLOWS FOR: has been doing well overall; denies any wheezing, cough, congestion, or SOB Daily saline nasal spray big help. Asks antibiotic to carry for beach trip. Zyrtec, Flonase prn.  Review of Systems-See HPI Constitutional:   No-   weight loss, night sweats, fevers, chills, fatigue, lassitude. HEENT:   + headaches, difficulty swallowing, tooth/dental problems, +sore throat,       sneezing, itching, ear ache, +nasal congestion, +post nasal drip,  CV:  No-   chest pain, orthopnea, PND, swelling in lower extremities, anasarca,  dizziness, palpitations Resp: No-   shortness of breath with exertion or at rest.             No-productive cough,  No non-productive cough,  No-  coughing up of blood.  No-change in color of mucus.  No- wheezing.    Objective:   Physical Exam General- Alert, Oriented, Affect-appropriate, Distress- none acute  Overweight Skin- rash-none, lesions- none, excoriation-  none Lymphadenopathy- none Head- atraumatic            Eyes- Gross vision intact, PERRLA, conjunctivae clear secretions, +periorbital edema            Ears- Hearing, canals-normal            Nose- +mucus, +minor erythema., No-Septal dev,  polyps, erosion, perforation, + raspy                             voice            Throat- Mallampati III-IV , mucosa clear-not red , drainage- none, tonsils- atrophic.  Neck- flexible , trachea midline, no stridor , thyroid nl, carotid no bruit Chest -            Lung- clear to P&A but diminished, wheeze- none, cough- none , dullness-none, rub- none           Chest wall-  Abd-  Br/ Gen/ Rectal- Not done, not indicated Extrem- cyanosis- none, clubbing, none, atrophy- none, strength- nl Neuro- grossly intact to observation

## 2013-06-04 NOTE — Assessment & Plan Note (Signed)
Controlled.  

## 2013-06-04 NOTE — Assessment & Plan Note (Signed)
No current rash

## 2013-06-04 NOTE — Assessment & Plan Note (Signed)
Adequate control ending spring pollen season

## 2013-08-26 DIAGNOSIS — M76829 Posterior tibial tendinitis, unspecified leg: Secondary | ICD-10-CM | POA: Diagnosis not present

## 2013-08-26 DIAGNOSIS — IMO0002 Reserved for concepts with insufficient information to code with codable children: Secondary | ICD-10-CM | POA: Diagnosis not present

## 2013-08-26 DIAGNOSIS — M24673 Ankylosis, unspecified ankle: Secondary | ICD-10-CM | POA: Diagnosis not present

## 2013-08-26 DIAGNOSIS — M24676 Ankylosis, unspecified foot: Secondary | ICD-10-CM | POA: Diagnosis not present

## 2013-09-23 DIAGNOSIS — M76829 Posterior tibial tendinitis, unspecified leg: Secondary | ICD-10-CM | POA: Diagnosis not present

## 2013-10-06 DIAGNOSIS — Z23 Encounter for immunization: Secondary | ICD-10-CM | POA: Diagnosis not present

## 2013-10-06 DIAGNOSIS — Z0000 Encounter for general adult medical examination without abnormal findings: Secondary | ICD-10-CM | POA: Diagnosis not present

## 2013-10-06 DIAGNOSIS — E780 Pure hypercholesterolemia: Secondary | ICD-10-CM | POA: Diagnosis not present

## 2013-10-06 DIAGNOSIS — E119 Type 2 diabetes mellitus without complications: Secondary | ICD-10-CM | POA: Diagnosis not present

## 2013-10-15 ENCOUNTER — Observation Stay (HOSPITAL_COMMUNITY)
Admission: EM | Admit: 2013-10-15 | Discharge: 2013-10-16 | Disposition: A | Discharge status: Home / Self Care | Payer: Medicare Other | Attending: Internal Medicine | Admitting: Internal Medicine

## 2013-10-15 ENCOUNTER — Encounter (HOSPITAL_COMMUNITY): Payer: Self-pay | Admitting: Emergency Medicine

## 2013-10-15 ENCOUNTER — Emergency Department (HOSPITAL_COMMUNITY): Payer: Medicare Other

## 2013-10-15 DIAGNOSIS — R0789 Other chest pain: Secondary | ICD-10-CM | POA: Insufficient documentation

## 2013-10-15 DIAGNOSIS — J3089 Other allergic rhinitis: Secondary | ICD-10-CM

## 2013-10-15 DIAGNOSIS — K219 Gastro-esophageal reflux disease without esophagitis: Secondary | ICD-10-CM | POA: Diagnosis present

## 2013-10-15 DIAGNOSIS — E079 Disorder of thyroid, unspecified: Secondary | ICD-10-CM

## 2013-10-15 DIAGNOSIS — Z872 Personal history of diseases of the skin and subcutaneous tissue: Secondary | ICD-10-CM | POA: Insufficient documentation

## 2013-10-15 DIAGNOSIS — E038 Other specified hypothyroidism: Secondary | ICD-10-CM

## 2013-10-15 DIAGNOSIS — Z79899 Other long term (current) drug therapy: Secondary | ICD-10-CM | POA: Insufficient documentation

## 2013-10-15 DIAGNOSIS — E119 Type 2 diabetes mellitus without complications: Secondary | ICD-10-CM | POA: Insufficient documentation

## 2013-10-15 DIAGNOSIS — J302 Other seasonal allergic rhinitis: Secondary | ICD-10-CM | POA: Diagnosis present

## 2013-10-15 DIAGNOSIS — E039 Hypothyroidism, unspecified: Secondary | ICD-10-CM | POA: Diagnosis not present

## 2013-10-15 DIAGNOSIS — R918 Other nonspecific abnormal finding of lung field: Secondary | ICD-10-CM | POA: Diagnosis not present

## 2013-10-15 DIAGNOSIS — M1990 Unspecified osteoarthritis, unspecified site: Secondary | ICD-10-CM | POA: Diagnosis not present

## 2013-10-15 DIAGNOSIS — I7300 Raynaud's syndrome without gangrene: Secondary | ICD-10-CM | POA: Diagnosis not present

## 2013-10-15 DIAGNOSIS — Z7951 Long term (current) use of inhaled steroids: Secondary | ICD-10-CM | POA: Insufficient documentation

## 2013-10-15 DIAGNOSIS — E785 Hyperlipidemia, unspecified: Secondary | ICD-10-CM | POA: Diagnosis present

## 2013-10-15 DIAGNOSIS — I499 Cardiac arrhythmia, unspecified: Secondary | ICD-10-CM | POA: Diagnosis not present

## 2013-10-15 DIAGNOSIS — R9431 Abnormal electrocardiogram [ECG] [EKG]: Secondary | ICD-10-CM | POA: Insufficient documentation

## 2013-10-15 DIAGNOSIS — I471 Supraventricular tachycardia: Secondary | ICD-10-CM | POA: Insufficient documentation

## 2013-10-15 DIAGNOSIS — R002 Palpitations: Principal | ICD-10-CM | POA: Insufficient documentation

## 2013-10-15 DIAGNOSIS — Z880 Allergy status to penicillin: Secondary | ICD-10-CM | POA: Insufficient documentation

## 2013-10-15 DIAGNOSIS — R42 Dizziness and giddiness: Secondary | ICD-10-CM | POA: Insufficient documentation

## 2013-10-15 DIAGNOSIS — Z8742 Personal history of other diseases of the female genital tract: Secondary | ICD-10-CM | POA: Diagnosis not present

## 2013-10-15 DIAGNOSIS — E669 Obesity, unspecified: Secondary | ICD-10-CM | POA: Diagnosis present

## 2013-10-15 DIAGNOSIS — R079 Chest pain, unspecified: Secondary | ICD-10-CM | POA: Diagnosis not present

## 2013-10-15 HISTORY — DX: Obesity, unspecified: E66.9

## 2013-10-15 HISTORY — DX: Chest pain, unspecified: R07.9

## 2013-10-15 HISTORY — DX: Hyperlipidemia, unspecified: E78.5

## 2013-10-15 LAB — I-STAT TROPONIN, ED: Troponin i, poc: 0.01 ng/mL (ref 0.00–0.08)

## 2013-10-15 LAB — CBC WITH DIFFERENTIAL/PLATELET
BASOS ABS: 0 10*3/uL (ref 0.0–0.1)
BASOS PCT: 0 % (ref 0–1)
EOS PCT: 3 % (ref 0–5)
Eosinophils Absolute: 0.3 10*3/uL (ref 0.0–0.7)
HCT: 39.5 % (ref 36.0–46.0)
Hemoglobin: 13.4 g/dL (ref 12.0–15.0)
LYMPHS PCT: 37 % (ref 12–46)
Lymphs Abs: 3.2 10*3/uL (ref 0.7–4.0)
MCH: 29 pg (ref 26.0–34.0)
MCHC: 33.9 g/dL (ref 30.0–36.0)
MCV: 85.5 fL (ref 78.0–100.0)
Monocytes Absolute: 0.5 10*3/uL (ref 0.1–1.0)
Monocytes Relative: 6 % (ref 3–12)
Neutro Abs: 4.8 10*3/uL (ref 1.7–7.7)
Neutrophils Relative %: 54 % (ref 43–77)
PLATELETS: 253 10*3/uL (ref 150–400)
RBC: 4.62 MIL/uL (ref 3.87–5.11)
RDW: 13.1 % (ref 11.5–15.5)
WBC: 8.8 10*3/uL (ref 4.0–10.5)

## 2013-10-15 LAB — BASIC METABOLIC PANEL
ANION GAP: 14 (ref 5–15)
BUN: 16 mg/dL (ref 6–23)
CO2: 28 mEq/L (ref 19–32)
Calcium: 9.4 mg/dL (ref 8.4–10.5)
Chloride: 101 mEq/L (ref 96–112)
Creatinine, Ser: 0.83 mg/dL (ref 0.50–1.10)
GFR calc Af Amer: 82 mL/min — ABNORMAL LOW (ref >90)
GFR calc non Af Amer: 71 mL/min — ABNORMAL LOW (ref >90)
Glucose, Bld: 137 mg/dL — ABNORMAL HIGH (ref 70–99)
Potassium: 4.1 mEq/L (ref 3.7–5.3)
Sodium: 143 mEq/L (ref 137–147)

## 2013-10-15 LAB — D-DIMER, QUANTITATIVE (NOT AT ARMC): D-Dimer, Quant: <0.27 ug/mL-FEU (ref 0.00–0.48)

## 2013-10-15 MED ORDER — ACETAMINOPHEN 325 MG PO TABS
650.0000 mg | ORAL_TABLET | ORAL | Status: DC | PRN
Start: 2013-10-15 — End: 2013-10-16
  Administered 2013-10-15: 650 mg via ORAL
  Filled 2013-10-15: qty 2

## 2013-10-15 MED ORDER — LEVOTHYROXINE SODIUM 100 MCG PO TABS
100.0000 ug | ORAL_TABLET | Freq: Every day | ORAL | Status: DC
  Administered 2013-10-16: 100 ug via ORAL
  Filled 2013-10-15 (×3): qty 1

## 2013-10-15 MED ORDER — DILTIAZEM HCL ER COATED BEADS 120 MG PO CP24
120.0000 mg | ORAL_CAPSULE | Freq: Every day | ORAL | Status: DC
  Administered 2013-10-16 (×2): 120 mg via ORAL
  Filled 2013-10-15 (×2): qty 1

## 2013-10-15 MED ORDER — SODIUM CHLORIDE 0.9 % IJ SOLN: 3.0000 mL | Freq: Two times a day (BID) | INTRAMUSCULAR | Status: DC

## 2013-10-15 MED ORDER — ALUM & MAG HYDROXIDE-SIMETH 200-200-20 MG/5ML PO SUSP: 30.0000 mL | Freq: Three times a day (TID) | ORAL | Status: DC | PRN

## 2013-10-15 MED ORDER — ONDANSETRON HCL 4 MG/2ML IJ SOLN: 4.0000 mg | Freq: Four times a day (QID) | INTRAMUSCULAR | Status: DC | PRN

## 2013-10-15 MED ORDER — ASPIRIN 81 MG PO CHEW
324.0000 mg | CHEWABLE_TABLET | Freq: Once | ORAL | Status: AC
  Administered 2013-10-15: 324 mg via ORAL
  Filled 2013-10-15: qty 4

## 2013-10-15 MED ORDER — AMITRIPTYLINE HCL 25 MG PO TABS
25.0000 mg | ORAL_TABLET | Freq: Every day | ORAL | Status: DC
  Administered 2013-10-15: 25 mg via ORAL
  Filled 2013-10-15 (×2): qty 1

## 2013-10-15 MED ORDER — SODIUM CHLORIDE 0.9 % IV SOLN: 250.0000 mL | INTRAVENOUS | Status: DC | PRN

## 2013-10-15 MED ORDER — NITROGLYCERIN 0.4 MG SL SUBL
0.4000 mg | SUBLINGUAL_TABLET | SUBLINGUAL | Status: DC | PRN
Start: 2013-10-15 — End: 2013-10-16

## 2013-10-15 MED ORDER — PANTOPRAZOLE SODIUM 40 MG PO TBEC
40.0000 mg | DELAYED_RELEASE_TABLET | Freq: Every day | ORAL | Status: DC
  Administered 2013-10-16: 40 mg via ORAL
  Filled 2013-10-15: qty 1

## 2013-10-15 MED ORDER — FLUTICASONE PROPIONATE 50 MCG/ACT NA SUSP
1.0000 | Freq: Every day | NASAL | Status: DC
  Administered 2013-10-16: 1 via NASAL
  Filled 2013-10-15: qty 16

## 2013-10-15 MED ORDER — NITROGLYCERIN 0.4 MG SL SUBL
0.4000 mg | SUBLINGUAL_TABLET | SUBLINGUAL | Status: DC | PRN
  Administered 2013-10-15 (×2): 0.4 mg via SUBLINGUAL
  Filled 2013-10-15 (×2): qty 1

## 2013-10-15 MED ORDER — ASPIRIN EC 81 MG PO TBEC
81.0000 mg | DELAYED_RELEASE_TABLET | Freq: Every day | ORAL | Status: DC
  Administered 2013-10-16: 81 mg via ORAL
  Filled 2013-10-15: qty 1

## 2013-10-15 MED ORDER — LORATADINE 10 MG PO TABS
10.0000 mg | ORAL_TABLET | Freq: Every day | ORAL | Status: DC
  Filled 2013-10-15: qty 1

## 2013-10-15 MED ORDER — SODIUM CHLORIDE 0.9 % IJ SOLN: 3.0000 mL | INTRAMUSCULAR | Status: DC | PRN

## 2013-10-15 NOTE — H&P (Signed)
History and Physical  Patient ID: Jill Dean MRN: [id number], SOB: October 22, 1945 68 y.o. Date of Encounter: 10/15/2013, 10:12 PM  Primary Physician: Horton Finer, MD Primary Cardiologist: unassigned  Chief Complaint: lightheadedness, weakness  HPI: 68 y.o. female w/ PMHx significant for GERD, allergies and raynaud's syndrome, and diet controlled diabetes who presented to [facility] on 10/15/2013 with complaints of weakness. Has been feeling a little off for over a week every since getting her flu shot. However, pretty minimal change from baseline. Felt well enough to do a lot of shopping at [employer] today. While shopping, she suddenly felt weak and tired and became very diaphoretic. Drank a soda to no avail and dialed 911. Was evaluated by EMS and was found to be in a fast heart rate of ~200 bpm. Reports being on monitor but no strips available for review. Instructed by ems to perform vagal maneuver and her fast heart rate broke to 110s and she felt much improved. However, during transport, she noted some vague sensation of chest pressure. Not pleurtic. She reports similar to her heart burn but it was improved with nitroglycerin. Currently, still with this vague sensation of chest pressure. No nausea, vomiting, radiation. No history of blood clots. No etoh. Daily coffee. No illicits. Nonsmoker.  No FH of arrhythmias. CHF in her elderly mother.  EKG revealed sinus tach with RBBB, LAFB, st flattening in RB leads, no comparison. Nothing on telemetry. CXR was without acute cardiopulmonary abnormalities. Labs are significant for neg troponin.   Past Medical History  Diagnosis Date  . Osteoarthrosis, unspecified whether generalized or localized, unspecified site   . Raynaud's syndrome   . Urticaria, unspecified   . Other chronic allergic conjunctivitis   . Allergic rhinitis, cause unspecified   . Fibroid     Ruptured Left endometrioma  . Thyroid disease     Hypothyroid  .  Endometriosis   . Diabetes mellitus without complication   . DM (diabetes mellitus)     Non-insulin-dependent     Surgical History:  Past Surgical History  Procedure Laterality Date  . Thyroid nodule    . Knot right breast    . Tonsillectomy    . Vesicovaginal fistula closure w/ tah    . Cholecystectomy    . Oophorectomy      LSO 84-RSO 04  . Vaginal hysterectomy  2004    [facility] RSO     Home Meds: Prior to Admission medications   Medication Sig Start Date End Date Taking? Authorizing Provider  amitriptyline (ELAVIL) 25 MG tablet Take 25 mg by mouth at bedtime.     Yes Historical Provider, MD  Biotin 10 MG CAPS Take 1 capsule by mouth daily.   Yes Historical Provider, MD  cetirizine (ZYRTEC) 10 MG tablet Take 10 mg by mouth daily.     Yes Historical Provider, MD  fluticasone (FLONASE) 50 MCG/ACT nasal spray USE 2 SPRAYS IN EACH NOSTRIL DAILY 08/20/12  Yes Deneise Lever, MD  levothyroxine (SYNTHROID, LEVOTHROID) 100 MCG tablet Take 100 mcg by mouth daily.     Yes Historical Provider, MD  Lutein 20 MG CAPS Take 1 capsule by mouth daily.     Yes Historical Provider, MD  Multiple Vitamin (MULTIVITAMIN) capsule Take 1 capsule by mouth daily.     Yes Historical Provider, MD  Omega-3 Fatty Acids (OMEGA 3 PO) Take 1 capsule by mouth 2 (two) times daily.   Yes Historical Provider, MD  omeprazole (PRILOSEC) 20 MG capsule Take 20 mg  by mouth daily.     Yes Historical Provider, MD    Allergies:  Allergies  Allergen Reactions  . Sulfonamide Derivatives Anaphylaxis and Swelling  . Azithromycin     REACTION: itching  . Clarithromycin     REACTION: severe itching  . Codeine Itching  . Dilaudid [Hydromorphone Hcl]   . Hydrocod Polst-Cpm Polst Er Other (See Comments)    "hypes me up."  . Lidocaine     "makes me shake"  . Morphine And Related   . Penicillins Itching  . Pseudoephedrine     REACTION: tachycardia  . Shrimp [Shellfish Allergy] Itching    History   Social History  .  Marital Status: Married    Spouse Name: N/A    Number of Children: N/A  . Years of Education: N/A   Occupational History  . Beautician    Social History Main Topics  . Smoking status: Never Smoker   . Smokeless tobacco: Never Used  . Alcohol Use: Yes     Comment: RARE  . Drug Use: No  . Sexual Activity: No   Other Topics Concern  . Not on file   Social History Narrative  . No narrative on file     Family History  Problem Relation Age of Onset  . Lung cancer Father   . Heart disease Father   . Heart failure Mother   . Dementia Mother     Review of Systems: General: negative for chills, fever, night sweats or weight changes.  Cardiovascular: see HPI. Dermatological: negative for rash Respiratory: negative for cough or wheezing Urologic: negative for hematuria Abdominal: negative for nausea, vomiting, diarrhea, bright red blood per rectum, melena, or hematemesis Neurologic: negative for visual changes, syncope, or dizziness All other systems reviewed and are otherwise negative except as noted above.  Labs:   Lab Results  Component Value Date   WBC 8.8 10/15/2013   HGB 13.4 10/15/2013   HCT 39.5 10/15/2013   MCV 85.5 10/15/2013   PLT 253 10/15/2013    Recent Labs Lab 10/15/13 1757  NA 143  K 4.1  CL 101  CO2 28  BUN 16  CREATININE 0.83  CALCIUM 9.4  GLUCOSE 137*   Radiology/Studies:  Dg Chest 2 View  10/15/2013   CLINICAL DATA:  68 year old female with history of palpitations.  EXAM: CHEST - 2 VIEW  COMPARISON:  None.  FINDINGS: Cardiomediastinal silhouette projects within normal limits in size and contour.  No evidence of pulmonary vascular congestion.  Vague opacity at the right base, in the setting of low lung volumes. No visualized pneumothorax or pleural effusion.  No displaced fracture.  Surgical clips of the right upper quadrant compatible with prior cholecystectomy.  IMPRESSION: Ill-defined opacity at the right base may reflect low lung volumes  and atelectasis, however, it would be difficult to exclude a developing infection.  Signed,  Dulcy Fanny. Earleen Newport, DO  [facility]  [facility]   Electronically Signed   By: Corrie Mckusick D.O.   On: 10/15/2013 21:18     Physical Exam: Blood pressure 96/76, pulse 90, temperature 98.6 F (37 C), temperature source Oral, resp. rate 17, height 5\' 3"  (1.6 m), weight 95.709 kg (211 lb), SpO2 93.00%. General: Well developed, well nourished, in no acute distress. Head: Normocephalic, atraumatic, sclera non-icteric, nares are without discharge Neck: Supple. Negative for carotid bruits. JVD not elevated. Lungs: Clear bilaterally to auscultation without wheezes, rales, or rhonchi. Breathing is unlabored. Heart: RRR with S1  S2. No murmurs, rubs, or gallops appreciated. Abdomen: Soft, non-tender, non-distended with normoactive bowel sounds. No rebound/guarding. No obvious abdominal masses. Msk:  Strength and tone appear normal for age. Extremities: No edema. No clubbing or cyanosis. Distal pedal pulses are 2+ and equal bilaterally. Neuro: Alert and oriented X 3. Moves all extremities spontaneously. Psych:  Responds to questions appropriately with a normal affect.    ASSESSMENT AND PLAN:  Problem List 1. Arrhythmia, no strip but story consistent with SVT, resolved with Vagal  2. Atypical chest pain 3. Hypothyroid, on replacement 4. Diet controlled diabetes 5. Bifascicular block on EKG, no comparison  68 y.o. female w/ PMHx significant for GERD, allergies and raynaud's syndrome, and diet controlled diabetes who presented to [facility] on 10/15/2013 with complaints of acute fatigue, palpitations --> tachycardic per EMS (on monitor), resolved with vagal manuevers.  Due to continue symptoms of vague chest pressure, will admit for monitoring and allow for further workup of arrhythmia. Likely SVT based upon history and response to vagal maneuver. Will get  echo to evaluate for structural heart disease. Lytes wnl. Check TSH. Will low dose diltiazem (beta blocker interferes with allergy medications).  Atypical chest pressure in the setting of recent tachycardia. Nl troponin. Will check d-dimer (low probability). Serial troponins and continue to monitor. Risk factor of diet controlled diabetes. Check lipids, likely should be on statin therapy in the long term. NPO for the low likelihood of needing a stress test.  Ambulatory. PPI. Full code.  Signed, Elias Else, Deagan Sevin C. MD 10/15/2013, 10:12 PM

## 2013-10-15 NOTE — Plan of Care (Signed)
Problem: Consults Goal: Skin Care Protocol Initiated - if Braden Score 18 or less If consults are not indicated, leave blank or document N/A Outcome: Not Applicable Date Met:  10/15/13 braden score > 18     

## 2013-10-15 NOTE — ED Notes (Addendum)
Pt presents to department via [facility] for evaluation of SVT. Pt states she was at [facility] shopping when she became weak and experienced increased HR in 200's. Vagal maneuvers performed by EMS, HR decreased to 110. Upon arrival pt denies chest pain, respirations unlabored. Pt is alert and oriented x4. Skin warm and dry. CBG 157.

## 2013-10-15 NOTE — ED Notes (Signed)
Pt reporting return of chest pressure but denies pain; MD aware; second nitro offered but refused by pt at this time.

## 2013-10-15 NOTE — ED Notes (Signed)
Meal given per EDP 

## 2013-10-15 NOTE — ED Notes (Signed)
Pt back from xray and still having chest pressure. Will take another nitro.

## 2013-10-15 NOTE — ED Provider Notes (Signed)
CSN: [id number]     Arrival date & time 10/15/13  1740 History   First MD Initiated Contact with Patient 10/15/13 [place]     Chief Complaint  Patient presents with  . Palpitations     Patient is a 68 y.o. female presenting with palpitations. The history is provided by the patient.  Palpitations Palpitations quality:  Regular Onset quality:  Sudden Timing:  Constant Progression:  Improving Chronicity:  New Relieved by: vagal maneuvers. Worsened by:  Nothing tried Associated symptoms: chest pain and dizziness   Associated symptoms: no shortness of breath, no syncope and no vomiting   Risk factors: no hx of PE   Patient reports she was at [employer] shopping when she had onset of fatigue, diaphoresis She went to sit down and did not improve EMS was called and on their arrival she was noted to have HR>200 and concern for SVT She was instructed to try vagal maneuvers and this improved her HR She now feels improved but she does report mild central CP, but denies pleuritic CP No SOB No h/o CAD/PE  She had otherwise been well No h/o SVT  Past Medical History  Diagnosis Date  . Osteoarthrosis, unspecified whether generalized or localized, unspecified site   . Raynaud's syndrome   . Urticaria, unspecified   . Other chronic allergic conjunctivitis   . Allergic rhinitis, cause unspecified   . Fibroid     Ruptured Left endometrioma  . Thyroid disease     Hypothyroid  . Endometriosis   . Diabetes mellitus without complication   . DM (diabetes mellitus)     Non-insulin-dependent   Past Surgical History  Procedure Laterality Date  . Thyroid nodule    . Knot right breast    . Tonsillectomy    . Vesicovaginal fistula closure w/ tah    . Cholecystectomy    . Oophorectomy      LSO 84-RSO 04  . Vaginal hysterectomy  2004    [facility] RSO   Family History  Problem Relation Age of Onset  . Lung cancer Father   . Heart disease Father   . Heart failure Mother   . Dementia Mother     History  Substance Use Topics  . Smoking status: Never Smoker   . Smokeless tobacco: Never Used  . Alcohol Use: Yes     Comment: RARE   OB History   Grav Para Term Preterm Abortions TAB SAB Ect Mult Living   1 1 1       1      Review of Systems  Constitutional: Negative for fever.  Respiratory: Negative for shortness of breath.   Cardiovascular: Positive for chest pain and palpitations. Negative for syncope.  Gastrointestinal: Negative for vomiting.  Neurological: Positive for dizziness. Negative for syncope.  All other systems reviewed and are negative.     Allergies  Sulfonamide derivatives; Azithromycin; Clarithromycin; Codeine; Dilaudid; Hydrocod polst-cpm polst er; Lidocaine; Morphine and related; Penicillins; Pseudoephedrine; and Shrimp  Home Medications   Prior to Admission medications   Medication Sig Start Date End Date Taking? Authorizing Provider  amitriptyline (ELAVIL) 25 MG tablet Take 25 mg by mouth at bedtime.      Historical Provider, MD  Biotin 10 MG CAPS Take 1 capsule by mouth daily.    Historical Provider, MD  cetirizine (ZYRTEC) 10 MG tablet Take 10 mg by mouth daily.      Historical Provider, MD  doxycycline (VIBRAMYCIN) 50 MG capsule 2 today then one daily 06/04/13  Deneise Lever, MD  fluticasone [facility]) 50 MCG/ACT nasal spray USE 2 SPRAYS IN EACH NOSTRIL DAILY 08/20/12   Deneise Lever, MD  levothyroxine (SYNTHROID, LEVOTHROID) 100 MCG tablet Take 100 mcg by mouth daily.      Historical Provider, MD  Lutein 20 MG CAPS Take 1 capsule by mouth daily.      Historical Provider, MD  Multiple Vitamin (MULTIVITAMIN) capsule Take 1 capsule by mouth daily.      Historical Provider, MD  omeprazole (PRILOSEC) 20 MG capsule Take 20 mg by mouth daily.      Historical Provider, MD   BP 136/68  Pulse 115  Temp(Src) 98.6 F (37 C) (Oral)  Resp 18  Ht 5\' 3"  (1.6 m)  Wt 211 lb (95.709 kg)  BMI 37.39 kg/m2  SpO2 95% Physical Exam CONSTITUTIONAL: Well  developed/well nourished HEAD: Normocephalic/atraumatic EYES: EOMI/PERRL ENMT: Mucous membranes moist NECK: supple no meningeal signs SPINE:entire spine nontender CV: S1/S2 noted, no murmurs/rubs/gallops noted LUNGS: Lungs are clear to auscultation bilaterally, no apparent distress ABDOMEN: soft, nontender, no rebound or guarding GU:no cva tenderness NEURO: Pt is awake/alert, moves all extremitiesx4 EXTREMITIES: pulses normal, full ROM, no LE edema or tenderness SKIN: warm, color normal PSYCH: no abnormalities of mood noted  ED Course  Procedures  8:14 PM Pt with episode of SVT prior to arrival (no rhythm strip to review) but responded to vagal maneuvers No further episodes of SVT but does have persistent chest pressure with ST depression in EKG She was given ASA/NTG with resolution of pain D/w cardiology dr Elias Else, will evaluate for admission He requests CXR while awaiting admission  Labs Review Labs Reviewed  BASIC METABOLIC PANEL - Abnormal; Notable for the following:    Glucose, Bld 137 (*)    GFR calc non Af Amer 71 (*)    GFR calc Af Amer 82 (*)    All other components within normal limits  CBC WITH DIFFERENTIAL  I-STAT TROPOININ, ED      EKG Interpretation   Date/Time:  Wednesday October 15 2013 17:45:38 EDT Ventricular Rate:  112 PR Interval:  154 QRS Duration: 129 QT Interval:  369 QTC Calculation: 504 R Axis:   -115 Text Interpretation:  Sinus tachycardia RBBB and LAFB st depression in  anterior leads changed from prior Confirmed by [facility]  MD, [place]  [phone]) on 10/15/2013 5:50:16 PM      MDM   Final diagnoses:  Chest pain, unspecified chest pain type  Palpitations  Abnormal EKG    Nursing notes including past medical history and social history reviewed and considered in documentation Labs/vital reviewed and considered     Sharyon Cable, MD 10/16/66 2019

## 2013-10-15 NOTE — ED Notes (Signed)
Patient transported to x-ray. ?

## 2013-10-15 NOTE — ED Notes (Signed)
Pt used bedside commode; HR to 120's with exertion. Md aware

## 2013-10-15 NOTE — ED Notes (Signed)
Pt reports small relief after nitro.

## 2013-10-15 NOTE — ED Notes (Signed)
Cards at bedside

## 2013-10-16 ENCOUNTER — Observation Stay (HOSPITAL_COMMUNITY): Payer: Medicare Other

## 2013-10-16 ENCOUNTER — Encounter (HOSPITAL_COMMUNITY): Payer: Self-pay | Admitting: Physician Assistant

## 2013-10-16 DIAGNOSIS — E038 Other specified hypothyroidism: Secondary | ICD-10-CM | POA: Diagnosis not present

## 2013-10-16 DIAGNOSIS — R079 Chest pain, unspecified: Secondary | ICD-10-CM

## 2013-10-16 DIAGNOSIS — E119 Type 2 diabetes mellitus without complications: Secondary | ICD-10-CM | POA: Diagnosis not present

## 2013-10-16 DIAGNOSIS — M1990 Unspecified osteoarthritis, unspecified site: Secondary | ICD-10-CM | POA: Diagnosis not present

## 2013-10-16 DIAGNOSIS — R9431 Abnormal electrocardiogram [ECG] [EKG]: Secondary | ICD-10-CM | POA: Diagnosis not present

## 2013-10-16 DIAGNOSIS — E669 Obesity, unspecified: Secondary | ICD-10-CM | POA: Diagnosis present

## 2013-10-16 DIAGNOSIS — R002 Palpitations: Secondary | ICD-10-CM

## 2013-10-16 DIAGNOSIS — E785 Hyperlipidemia, unspecified: Secondary | ICD-10-CM | POA: Diagnosis present

## 2013-10-16 DIAGNOSIS — R0789 Other chest pain: Secondary | ICD-10-CM | POA: Diagnosis not present

## 2013-10-16 LAB — HEMOGLOBIN A1C
HEMOGLOBIN A1C: 7 % — AB (ref <5.7)
HEMOGLOBIN A1C: 7.2 % — AB (ref <5.7)
Mean Plasma Glucose: 160 mg/dL — ABNORMAL HIGH (ref <117)
Mean Plasma Glucose: 154 mg/dL — ABNORMAL HIGH (ref <117)

## 2013-10-16 LAB — BASIC METABOLIC PANEL
Anion gap: 13 (ref 5–15)
BUN: 17 mg/dL (ref 6–23)
CO2: 27 mEq/L (ref 19–32)
CREATININE: 0.86 mg/dL (ref 0.50–1.10)
Calcium: 8.9 mg/dL (ref 8.4–10.5)
Chloride: 101 mEq/L (ref 96–112)
GFR, EST AFRICAN AMERICAN: 79 mL/min — AB (ref >90)
GFR, EST NON AFRICAN AMERICAN: 68 mL/min — AB (ref >90)
GLUCOSE: 132 mg/dL — AB (ref 70–99)
Potassium: 4.1 mEq/L (ref 3.7–5.3)
SODIUM: 141 meq/L (ref 137–147)

## 2013-10-16 LAB — TROPONIN I
Troponin I: <0.3 ng/mL (ref <0.30)
Troponin I: <0.3 ng/mL (ref <0.30)

## 2013-10-16 LAB — LIPID PANEL
Cholesterol: 236 mg/dL — ABNORMAL HIGH (ref 0–200)
HDL: 53 mg/dL (ref >39)
LDL Cholesterol: 139 mg/dL — ABNORMAL HIGH (ref 0–99)
Total CHOL/HDL Ratio: 4.5 RATIO
Triglycerides: 219 mg/dL — ABNORMAL HIGH (ref <150)
VLDL: 44 mg/dL — ABNORMAL HIGH (ref 0–40)

## 2013-10-16 LAB — TSH: TSH: 5.24 u[IU]/mL — ABNORMAL HIGH (ref 0.350–4.500)

## 2013-10-16 LAB — T4, FREE: Free T4: 1.07 ng/dL (ref 0.80–1.80)

## 2013-10-16 MED ORDER — TECHNETIUM TC 99M SESTAMIBI GENERIC - CARDIOLITE
10.0000 | Freq: Once | INTRAVENOUS | Status: AC | PRN
  Administered 2013-10-16: 10 via INTRAVENOUS

## 2013-10-16 MED ORDER — TECHNETIUM TC 99M SESTAMIBI GENERIC - CARDIOLITE
30.0000 | Freq: Once | INTRAVENOUS | Status: AC | PRN
  Administered 2013-10-16: 30 via INTRAVENOUS

## 2013-10-16 MED ORDER — DILTIAZEM HCL ER COATED BEADS 120 MG PO CP24: 120.0000 mg | ORAL_CAPSULE | Freq: Every day | ORAL | Status: DC

## 2013-10-16 NOTE — Progress Notes (Signed)
Patient Name: Jill Dean Date of Encounter: 10/16/2013     Active Problems:   SVT (supraventricular tachycardia)    SUBJECTIVE  No further palpitations. Still with some mild chest pressure that returned while standing up to wash her face.   CURRENT MEDS . amitriptyline  25 mg Oral QHS  . aspirin EC  81 mg Oral Daily  . diltiazem  120 mg Oral Daily  . fluticasone  1 spray Each Nare Daily  . levothyroxine  100 mcg Oral QAC breakfast  . loratadine  10 mg Oral Daily  . pantoprazole  40 mg Oral Daily  . sodium chloride  3 mL Intravenous Q12H    OBJECTIVE  Filed Vitals:   10/15/13 2202 10/15/13 2207 10/15/13 2308 10/16/13 0532  BP: 96/76 109/93 120/88 102/53  Pulse: 90 91 91 76  Temp:   97.9 F (36.6 C) 97.9 F (36.6 C)  TempSrc:   Oral Oral  Resp: 17 20 18 18   Height:   5\' 3"  (1.6 m)   Weight:   209 lb 11.2 oz (95.119 kg)   SpO2: 93% 94% 97% 95%   No intake or output data in the 24 hours ending 10/16/13 0911 Filed Weights   10/15/13 1743 10/15/13 2308  Weight: 211 lb (95.709 kg) 209 lb 11.2 oz (95.119 kg)    PHYSICAL EXAM  General: Pleasant, NAD. obese Neuro: Alert and oriented X 3. Moves all extremities spontaneously. Psych: Normal affect. HEENT:  Normal  Neck: Supple without bruits or JVD. Lungs:  Resp regular and unlabored, CTA. Heart: RRR no s3, s4, or murmurs. Abdomen: Soft, non-tender, non-distended, BS + x 4.  Extremities: No clubbing, cyanosis or edema. DP/PT/Radials 2+ and equal bilaterally.  Accessory Clinical Findings  CBC  Recent Labs  10/15/13 1757  WBC 8.8  NEUTROABS 4.8  HGB 13.4  HCT 39.5  MCV 85.5  PLT 376   Basic Metabolic Panel  Recent Labs  10/15/13 1757 10/16/13 0510  NA 143 141  K 4.1 4.1  CL 101 101  CO2 28 27  GLUCOSE 137* 132*  BUN 16 17  CREATININE 0.83 0.86  CALCIUM 9.4 8.9   Cardiac Enzymes  Recent Labs  10/16/13 0030 10/16/13 0510  TROPONINI <0.30 <0.30   D-Dimer  Recent Labs  10/15/13 2220    DDIMER <0.27    Fasting Lipid Panel  Recent Labs  10/16/13 0510  CHOL 236*  HDL 53  LDLCALC 139*  TRIG 219*  CHOLHDL 4.5   Thyroid Function Tests  Recent Labs  10/16/13 0031  TSH 5.240*    TELE  NSR  Radiology/Studies  Dg Chest 2 View  10/15/2013   CLINICAL DATA:  68 year old female with history of palpitations.  EXAM: CHEST - 2 VIEW  COMPARISON:  None.  FINDINGS: Cardiomediastinal silhouette projects within normal limits in size and contour.  No evidence of pulmonary vascular congestion.  Vague opacity at the right base, in the setting of low lung volumes. No visualized pneumothorax or pleural effusion.  No displaced fracture.  Surgical clips of the right upper quadrant compatible with prior cholecystectomy.  IMPRESSION: Ill-defined opacity at the right base may reflect low lung volumes and atelectasis, however, it would be difficult to exclude a developing infection.  Signed,  Dulcy Fanny. Earleen Newport, DO  [facility]  [facility]   Electronically Signed   By: Corrie Mckusick D.O.   On: 10/15/2013 21:18    ASSESSMENT AND PLAN MONTIA HASLIP is a  68 y.o. female with a history of GERD, hypothyroidism, allergies and raynaud's syndrome, and diet controlled diabetes who presented to [facility] on 10/15/2013 with complaints of acute fatigue, palpitations --> tachycardic 200bpm per EMS (on monitor), resolved with vagal maneuvers and then vague chest pressure.   Chest pressure- similar to indigestion but relieved by NTG -- Troponin neg x2. -- D Dimer negative -- ECG with sinus tach with RBBB, LAFB, st flattening in RB leads -- Continue protonix -- She is NPO for possible stress test this AM  Tachypalpitations- sounds like SVT based upon history and response to vagal maneuver.  No strips available. -- 2D ECHO today to assess for structural heart disease -- Started on Cardizem 120mg  qd -- Consider a heart monitor at discharge.    Elevated TSH- (5.2)  -- Will order free T4 -- Follow up with PCP  Signed, Eileen Stanford PA-C  Pager [phone]  Personally seen and examined. Agree with above. Stress test.  ECHO Cardizem.  EP if returns.   Candee Furbish, MD

## 2013-10-16 NOTE — Progress Notes (Signed)
UR completed 

## 2013-10-16 NOTE — Discharge Summary (Signed)
Discharge Summary   Patient ID: Jill Dean MRN: [id number], DOB/AGE: 1945/11/13 68 y.o. Admit date: 10/15/2013 D/C date:     10/16/2013  Primary Cardiologist:  Dr. Marlou Porch (new)   Principal Problem:   Chest pain Active Problems:   Raynaud's syndrome   Seasonal and perennial allergic rhinitis   GERD   Hypothyroidism   Palpitations   DM (diabetes mellitus)   Obesity   HLD (hyperlipidemia)    Admission Dates: 10/15/13-10/16/13 Discharge Diagnosis: chest pain and palpitations s/p normal nuclear stress test  HPI: Jill Dean is a 68 y.o. female with a history of GERD, hypothyroidism, allergies and raynaud's syndrome, and diet controlled diabetes who presented to [facility] on 10/15/2013 with complaints of acute fatigue, palpitations --> tachycardic 200bpm per EMS (on monitor), resolved with vagal maneuvers and then vague chest pressure. She was admitted for further evaluation of her chest pressure.  Upon admission she reported feeling a "little off" for over a week every since getting her flu shot. However, pretty minimal change from baseline. While shopping at [employer], she suddenly felt weak and tired and became very diaphoretic. Drank a soda to no avail and dialed 911. Was evaluated by EMS and was found to be in a fast heart rate of ~200 bpm. Reports being on monitor but no strips available for review. Instructed by ems to perform vagal maneuver and her fast heart rate broke to 110s and she felt much improved. However, during transport, she noted some vague sensation of chest pressure. Not pleurtic. She reports similar to her heart burn but it was improved with nitroglycerin.  No nausea, vomiting, radiation. No history of blood clots. No etoh. Daily coffee. No illicits. Nonsmoker. She continued to complain of a vague sensation of chest pressure and was admitted for further observation.     Hospital Course  Chest pressure- similar to indigestion but relieved by NTG  --  Troponin neg x2.  -- D Dimer negative  -- ECG with sinus tach with RBBB, LAFB, st flattening in RB leads  -- Continue protonix  -- Nuclear stress test today with with no chest pain, no ST changes and no evidence of ischemia or infarction in any vascular territory; EF:86% and the wall motion was normal.   Tachypalpitations- sounds like SVT based upon history and response to vagal maneuver. No strips available.  -- 2D ECHO today to assess for structural heart disease  -- Started on Cardizem 120mg  qd  -- Will follow up in clinic in 2 months with Dr. Marlou Porch  Elevated TSH- (5.2)  -- Free T4 normal at 1.07 -- Follow up with PCP   HLD- lipid panel on this admission with TC 236: TG 219. HDL 52. LDL 139.  -- I suggested starting a statin but she refused  -- Her PCP follows her cholesterol  DM- diet controlled.  -- HgA1c 7.2 -- Follow up with PCP.  The patient has had an uncomplicated hospital course and is recovering well. She has been seen by Dr. Marlou Porch today and deemed ready for discharge home. All follow-up appointments have been scheduled. Discharge medications are listed below.   Discharge Vitals: Blood pressure 112/50, pulse 78, temperature 98 F (36.7 C), temperature source Oral, resp. rate 16, height 5\' 3"  (1.6 m), weight 209 lb 11.2 oz (95.119 kg), SpO2 95.00%.  Labs: Lab Results  Component Value Date   WBC 8.8 10/15/2013   HGB 13.4 10/15/2013   HCT 39.5 10/15/2013   MCV 85.5 10/15/2013  PLT 253 10/15/2013     Recent Labs Lab 10/16/13 0510  NA 141  K 4.1  CL 101  CO2 27  BUN 17  CREATININE 0.86  CALCIUM 8.9  GLUCOSE 132*    Recent Labs  10/16/13 0030 10/16/13 0510 10/16/13 1026  TROPONINI <0.30 <0.30 <0.30   Lab Results  Component Value Date   CHOL 236* 10/16/2013   HDL 53 10/16/2013   LDLCALC 139* 10/16/2013   TRIG 219* 10/16/2013   Lab Results  Component Value Date   DDIMER <0.27 10/15/2013    Diagnostic Studies/Procedures   Dg Chest 2  View  10/15/2013   CLINICAL DATA:  68 year old female with history of palpitations.  EXAM: CHEST - 2 VIEW  COMPARISON:  None.  FINDINGS: Cardiomediastinal silhouette projects within normal limits in size and contour.  No evidence of pulmonary vascular congestion.  Vague opacity at the right base, in the setting of low lung volumes. No visualized pneumothorax or pleural effusion.  No displaced fracture.  Surgical clips of the right upper quadrant compatible with prior cholecystectomy.  IMPRESSION: Ill-defined opacity at the right base may reflect low lung volumes and atelectasis, however, it would be difficult to exclude a developing infection.    GXT Nuclear stress test 10/16/13 IMPRESSION:  Normal stress nuclear study with no chest pain, no ST changes and no  evidence of ischemia or infarction in any vascular territory; the  gated ejection fraction was 86% and the wall motion was normal.     Discharge Medications     Medication List         amitriptyline 25 MG tablet  Commonly known as:  ELAVIL  Take 25 mg by mouth at bedtime.     Biotin 10 MG Caps  Take 1 capsule by mouth daily.     cetirizine 10 MG tablet  Commonly known as:  ZYRTEC  Take 10 mg by mouth daily.     diltiazem 120 MG 24 hr capsule  Commonly known as:  CARDIZEM CD  Take 1 capsule (120 mg total) by mouth daily.     fluticasone 50 MCG/ACT nasal spray  Commonly known as:  FLONASE  USE 2 SPRAYS IN EACH NOSTRIL DAILY     levothyroxine 100 MCG tablet  Commonly known as:  SYNTHROID, LEVOTHROID  Take 100 mcg by mouth daily.     Lutein 20 MG Caps  Take 1 capsule by mouth daily.     multivitamin capsule  Take 1 capsule by mouth daily.     OMEGA 3 PO  Take 1 capsule by mouth 2 (two) times daily.     omeprazole 20 MG capsule  Commonly known as:  PRILOSEC  Take 20 mg by mouth daily.        Disposition   The patient will be discharged in stable condition to home.  Follow-up Information   Follow up with  Horton Finer, MD. (Please follow up with your PCP about your diabetes and cholesterol)    Specialty:  Internal Medicine   Contact information:   [address] [place] [address] [phone]       Follow up with Candee Furbish, MD On 12/16/2013. (@ 8:30am)    Specialty:  Cardiology   Contact information:   [address] [address] [phone]         Duration of Discharge Encounter: Greater than 30 minutes including physician and PA time.  SignedAngelena Form R PA-C 10/16/2013, 3:35  PM   Personally seen and examined. Agree with above. NUC stress reassuring.  Diltiazem for SVT. IF returns, monitor/EPS. There was no strip from this occurrence (200 bpm per EMS). Candee Furbish, MD

## 2013-10-16 NOTE — Progress Notes (Signed)
Target heart rate 129, treadmill started.

## 2013-10-21 ENCOUNTER — Encounter (HOSPITAL_COMMUNITY): Payer: Self-pay | Admitting: Emergency Medicine

## 2013-10-21 ENCOUNTER — Emergency Department (HOSPITAL_COMMUNITY): Payer: Medicare Other

## 2013-10-21 ENCOUNTER — Observation Stay (HOSPITAL_COMMUNITY)
Admission: EM | Admit: 2013-10-21 | Discharge: 2013-10-23 | Disposition: A | Discharge status: Home / Self Care | Payer: Medicare Other | Attending: Internal Medicine | Admitting: Internal Medicine

## 2013-10-21 DIAGNOSIS — E669 Obesity, unspecified: Secondary | ICD-10-CM | POA: Insufficient documentation

## 2013-10-21 DIAGNOSIS — E039 Hypothyroidism, unspecified: Secondary | ICD-10-CM | POA: Diagnosis not present

## 2013-10-21 DIAGNOSIS — E038 Other specified hypothyroidism: Secondary | ICD-10-CM

## 2013-10-21 DIAGNOSIS — R079 Chest pain, unspecified: Secondary | ICD-10-CM | POA: Diagnosis present

## 2013-10-21 DIAGNOSIS — I4510 Unspecified right bundle-branch block: Secondary | ICD-10-CM | POA: Diagnosis not present

## 2013-10-21 DIAGNOSIS — K2100 Gastro-esophageal reflux disease with esophagitis, without bleeding: Secondary | ICD-10-CM

## 2013-10-21 DIAGNOSIS — I471 Supraventricular tachycardia: Secondary | ICD-10-CM

## 2013-10-21 DIAGNOSIS — R000 Tachycardia, unspecified: Secondary | ICD-10-CM | POA: Diagnosis not present

## 2013-10-21 DIAGNOSIS — Z79899 Other long term (current) drug therapy: Secondary | ICD-10-CM | POA: Diagnosis not present

## 2013-10-21 DIAGNOSIS — I7300 Raynaud's syndrome without gangrene: Secondary | ICD-10-CM | POA: Diagnosis not present

## 2013-10-21 DIAGNOSIS — I10 Essential (primary) hypertension: Secondary | ICD-10-CM | POA: Diagnosis not present

## 2013-10-21 DIAGNOSIS — Z87898 Personal history of other specified conditions: Secondary | ICD-10-CM

## 2013-10-21 DIAGNOSIS — K219 Gastro-esophageal reflux disease without esophagitis: Secondary | ICD-10-CM | POA: Insufficient documentation

## 2013-10-21 DIAGNOSIS — Z6837 Body mass index (BMI) 37.0-37.9, adult: Secondary | ICD-10-CM | POA: Diagnosis not present

## 2013-10-21 DIAGNOSIS — R0789 Other chest pain: Secondary | ICD-10-CM | POA: Diagnosis not present

## 2013-10-21 DIAGNOSIS — E119 Type 2 diabetes mellitus without complications: Secondary | ICD-10-CM | POA: Insufficient documentation

## 2013-10-21 DIAGNOSIS — E785 Hyperlipidemia, unspecified: Secondary | ICD-10-CM | POA: Diagnosis not present

## 2013-10-21 DIAGNOSIS — IMO0002 Reserved for concepts with insufficient information to code with codable children: Secondary | ICD-10-CM

## 2013-10-21 LAB — I-STAT TROPONIN, ED: Troponin i, poc: 0 ng/mL (ref 0.00–0.08)

## 2013-10-21 LAB — URINALYSIS, ROUTINE W REFLEX MICROSCOPIC
Bilirubin Urine: NEGATIVE
Glucose, UA: NEGATIVE mg/dL
Hgb urine dipstick: NEGATIVE
Ketones, ur: NEGATIVE mg/dL
NITRITE: NEGATIVE
PH: 6 (ref 5.0–8.0)
Protein, ur: NEGATIVE mg/dL
SPECIFIC GRAVITY, URINE: 1.008 (ref 1.005–1.030)
UROBILINOGEN UA: 0.2 mg/dL (ref 0.0–1.0)

## 2013-10-21 LAB — CBC
HEMATOCRIT: 37.9 % (ref 36.0–46.0)
Hemoglobin: 12.6 g/dL (ref 12.0–15.0)
MCH: 28.4 pg (ref 26.0–34.0)
MCHC: 33.2 g/dL (ref 30.0–36.0)
MCV: 85.4 fL (ref 78.0–100.0)
Platelets: 271 10*3/uL (ref 150–400)
RBC: 4.44 MIL/uL (ref 3.87–5.11)
RDW: 13 % (ref 11.5–15.5)
WBC: 9.2 10*3/uL (ref 4.0–10.5)

## 2013-10-21 LAB — URINE MICROSCOPIC-ADD ON

## 2013-10-21 MED ORDER — NITROGLYCERIN 2 % TD OINT
1.0000 [in_us] | TOPICAL_OINTMENT | Freq: Once | TRANSDERMAL | Status: AC
  Administered 2013-10-22: 1 [in_us] via TOPICAL
  Filled 2013-10-21: qty 1

## 2013-10-21 NOTE — ED Provider Notes (Signed)
CSN: [id number]     Arrival date & time 10/21/13  2245 History   First MD Initiated Contact with Patient 10/21/13 2303     Chief Complaint  Patient presents with  . Chest Pain     (Consider location/radiation/quality/duration/timing/severity/associated sxs/prior Treatment) HPI The patient was recently admitted to the emergency department for chest pressure and discharged on Wednesday after negative stress test. She states this evening around 9 PM while reclining she had a pressure type pain in the center of her chest. There is no radiation. It is not associated with shortness of breath, vomiting. She did have some mild nausea. She rang the pain 10/10 at the time. EMS was called. She was given aspirin and nitroglycerin x3 in route with no relief. She states that she is an emergency department her symptoms resolved but then have reemerged. She describes a pressure now is 8/10. She denies any recent cough, fever or chills. She states that since her hospitalization she has felt fatigued. She denies any urinary symptoms. She states she's been compliant with her medication. Past Medical History  Diagnosis Date  . Osteoarthrosis, unspecified whether generalized or localized, unspecified site   . Raynaud's syndrome   . Urticaria, unspecified   . Other chronic allergic conjunctivitis   . Allergic rhinitis, cause unspecified   . Fibroid     Ruptured Left endometrioma  . Thyroid disease     Hypothyroid  . Endometriosis   . Diabetes mellitus without complication   . DM (diabetes mellitus)     Non-insulin-dependent; diet controlled  . Obesity   . HLD (hyperlipidemia)   . Chest pain     a. s/p normal ett nuclear stress test on 10/16/13   Past Surgical History  Procedure Laterality Date  . Thyroid nodule    . Knot right breast    . Tonsillectomy    . Vesicovaginal fistula closure w/ tah    . Cholecystectomy    . Oophorectomy      LSO 84-RSO 04  . Vaginal hysterectomy  2004    [facility] RSO    Family History  Problem Relation Age of Onset  . Lung cancer Father   . Heart disease Father   . Heart failure Mother   . Dementia Mother    History  Substance Use Topics  . Smoking status: Never Smoker   . Smokeless tobacco: Never Used  . Alcohol Use: Yes     Comment: RARE   OB History   Grav Para Term Preterm Abortions TAB SAB Ect Mult Living   1 1 1       1      Review of Systems  Constitutional: Positive for fatigue. Negative for fever and chills.  Respiratory: Negative for cough and shortness of breath.   Cardiovascular: Positive for chest pain. Negative for palpitations and leg swelling.  Gastrointestinal: Positive for nausea. Negative for vomiting, abdominal pain, diarrhea and constipation.  Musculoskeletal: Negative for back pain, myalgias, neck pain and neck stiffness.  Skin: Negative for rash and wound.  Neurological: Negative for dizziness, weakness, light-headedness and numbness.  All other systems reviewed and are negative.     Allergies  Sulfonamide derivatives; Azithromycin; Clarithromycin; Codeine; Dilaudid; Hydrocod polst-cpm polst er; Lidocaine; Morphine and related; Penicillins; Pseudoephedrine; and Shrimp  Home Medications   Prior to Admission medications   Medication Sig Start Date End Date Taking? Authorizing Provider  amitriptyline (ELAVIL) 25 MG tablet Take 25 mg by mouth at bedtime.      Historical Provider,  MD  Biotin 10 MG CAPS Take 1 capsule by mouth daily.    Historical Provider, MD  cetirizine (ZYRTEC) 10 MG tablet Take 10 mg by mouth daily.      Historical Provider, MD  diltiazem (CARDIZEM CD) 120 MG 24 hr capsule Take 1 capsule (120 mg total) by mouth daily. 10/16/13   Eileen Stanford, PA-C  fluticasone (FLONASE) 50 MCG/ACT nasal spray USE 2 SPRAYS IN EACH NOSTRIL DAILY 08/20/12   Deneise Lever, MD  levothyroxine (SYNTHROID, LEVOTHROID) 100 MCG tablet Take 100 mcg by mouth daily.      Historical Provider, MD  Lutein 20 MG CAPS Take 1  capsule by mouth daily.      Historical Provider, MD  Multiple Vitamin (MULTIVITAMIN) capsule Take 1 capsule by mouth daily.      Historical Provider, MD  Omega-3 Fatty Acids (OMEGA 3 PO) Take 1 capsule by mouth 2 (two) times daily.    Historical Provider, MD  omeprazole (PRILOSEC) 20 MG capsule Take 20 mg by mouth daily.      Historical Provider, MD   BP 126/57  Pulse 86  Temp(Src) 98.1 F (36.7 C) (Oral)  Resp 19  SpO2 93% Physical Exam  Nursing note and vitals reviewed. Constitutional: She is oriented to person, place, and time. She appears well-developed and well-nourished. No distress.  HENT:  Head: Normocephalic and atraumatic.  Mouth/Throat: Oropharynx is clear and moist.  Eyes: EOM are normal. Pupils are equal, round, and reactive to light.  Neck: Normal range of motion. Neck supple.  Cardiovascular: Normal rate and regular rhythm.  Exam reveals no gallop and no friction rub.   No murmur heard. Pulmonary/Chest: Effort normal and breath sounds normal. No respiratory distress. She has no wheezes. She has no rales. She exhibits tenderness (mild chest tenderness with palpation of the lower sternum).  Abdominal: Soft. Bowel sounds are normal. She exhibits no distension and no mass. There is no tenderness. There is no rebound and no guarding.  Musculoskeletal: Normal range of motion. She exhibits no edema and no tenderness.   No calf swelling or tenderness.  Neurological: She is alert and oriented to person, place, and time.  Moves all extremities without deficit. Sensation is grossly intact.  Skin: Skin is warm and dry. No rash noted. No erythema.  Psychiatric: She has a normal mood and affect. Her behavior is normal.    ED Course  Procedures (including critical care time) Labs Review Labs Reviewed  CBC  BASIC METABOLIC PANEL  URINALYSIS, [place], ED    Imaging Review No results found.   EKG Interpretation None      Date:  10/21/2013  Rate: 84  Rhythm: normal sinus rhythm  QRS Axis: normal  Intervals: QRS mildly prolonged  ST/T Wave abnormalities: nonspecific T wave changes  Conduction Disutrbances:right bundle branch block  Narrative Interpretation:   Old EKG Reviewed: unchanged   MDM   Final diagnoses:  None   Patient continues to have chest pressure despite nitroglycerin. Discuss with cardiology. Recommended admit to medicine service.     Julianne Rice, MD 10/31/13 [phone]

## 2013-10-21 NOTE — ED Notes (Signed)
Per [facility], pt c/o of a 10/10 "tight, heavy, burning" chest pain starting around 9pm. Per EMS, pt took 4 baby aspirin before EMS arrival. When EMS arrived, pt was slightly diaphoretic. EMS gave pt 3 nitro, but it gave no relief. Pt denies SOB, vomiting, or radiating pain. Pt states CP is 0/10 in triage. Pt states she only feels nauseous when she moves. NAD noted. Pt AO x4

## 2013-10-22 ENCOUNTER — Encounter (HOSPITAL_COMMUNITY)
Admission: EM | Disposition: A | Discharge status: Home / Self Care | Payer: Self-pay | Source: Home / Self Care | Attending: Internal Medicine

## 2013-10-22 DIAGNOSIS — IMO0002 Reserved for concepts with insufficient information to code with codable children: Secondary | ICD-10-CM

## 2013-10-22 DIAGNOSIS — I7300 Raynaud's syndrome without gangrene: Secondary | ICD-10-CM | POA: Diagnosis not present

## 2013-10-22 DIAGNOSIS — I10 Essential (primary) hypertension: Secondary | ICD-10-CM | POA: Diagnosis not present

## 2013-10-22 DIAGNOSIS — E785 Hyperlipidemia, unspecified: Secondary | ICD-10-CM | POA: Diagnosis not present

## 2013-10-22 DIAGNOSIS — R0789 Other chest pain: Secondary | ICD-10-CM | POA: Diagnosis not present

## 2013-10-22 DIAGNOSIS — E038 Other specified hypothyroidism: Secondary | ICD-10-CM | POA: Diagnosis not present

## 2013-10-22 DIAGNOSIS — K210 Gastro-esophageal reflux disease with esophagitis: Secondary | ICD-10-CM

## 2013-10-22 DIAGNOSIS — I4510 Unspecified right bundle-branch block: Secondary | ICD-10-CM | POA: Diagnosis present

## 2013-10-22 DIAGNOSIS — K219 Gastro-esophageal reflux disease without esophagitis: Secondary | ICD-10-CM | POA: Diagnosis not present

## 2013-10-22 DIAGNOSIS — I517 Cardiomegaly: Secondary | ICD-10-CM | POA: Diagnosis not present

## 2013-10-22 DIAGNOSIS — Z87898 Personal history of other specified conditions: Secondary | ICD-10-CM

## 2013-10-22 DIAGNOSIS — R079 Chest pain, unspecified: Secondary | ICD-10-CM

## 2013-10-22 HISTORY — PX: LEFT HEART CATHETERIZATION WITH CORONARY ANGIOGRAM: SHX5451

## 2013-10-22 LAB — BASIC METABOLIC PANEL
Anion gap: 11 (ref 5–15)
Anion gap: 14 (ref 5–15)
BUN: 15 mg/dL (ref 6–23)
BUN: 17 mg/dL (ref 6–23)
CHLORIDE: 100 meq/L (ref 96–112)
CO2: 28 meq/L (ref 19–32)
CO2: 25 meq/L (ref 19–32)
CREATININE: 0.68 mg/dL (ref 0.50–1.10)
Calcium: 9.1 mg/dL (ref 8.4–10.5)
Calcium: 9.3 mg/dL (ref 8.4–10.5)
Chloride: 101 mEq/L (ref 96–112)
Creatinine, Ser: 0.78 mg/dL (ref 0.50–1.10)
GFR calc Af Amer: >90 mL/min (ref >90)
GFR calc Af Amer: >90 mL/min (ref >90)
GFR calc non Af Amer: 84 mL/min — ABNORMAL LOW (ref >90)
GFR calc non Af Amer: 88 mL/min — ABNORMAL LOW (ref >90)
Glucose, Bld: 123 mg/dL — ABNORMAL HIGH (ref 70–99)
Glucose, Bld: 135 mg/dL — ABNORMAL HIGH (ref 70–99)
POTASSIUM: 4.2 meq/L (ref 3.7–5.3)
Potassium: 4.2 mEq/L (ref 3.7–5.3)
Sodium: 139 mEq/L (ref 137–147)
Sodium: 140 mEq/L (ref 137–147)

## 2013-10-22 LAB — GLUCOSE, CAPILLARY
Glucose-Capillary: 123 mg/dL — ABNORMAL HIGH (ref 70–99)
Glucose-Capillary: 123 mg/dL — ABNORMAL HIGH (ref 70–99)
Glucose-Capillary: 114 mg/dL — ABNORMAL HIGH (ref 70–99)
Glucose-Capillary: 82 mg/dL (ref 70–99)
Glucose-Capillary: 147 mg/dL — ABNORMAL HIGH (ref 70–99)

## 2013-10-22 LAB — RAPID URINE DRUG SCREEN, HOSP PERFORMED
AMPHETAMINES: NOT DETECTED
Barbiturates: NOT DETECTED
Benzodiazepines: NOT DETECTED
COCAINE: NOT DETECTED
OPIATES: NOT DETECTED
Tetrahydrocannabinol: NOT DETECTED

## 2013-10-22 LAB — TROPONIN I
Troponin I: <0.3 ng/mL (ref <0.30)
Troponin I: <0.3 ng/mL (ref <0.30)
Troponin I: <0.3 ng/mL (ref <0.30)

## 2013-10-22 LAB — PROTIME-INR
INR: 1 (ref 0.00–1.49)
Prothrombin Time: 13.3 seconds (ref 11.6–15.2)

## 2013-10-22 LAB — D-DIMER, QUANTITATIVE: D-Dimer, Quant: 0.48 ug/mL-FEU (ref 0.00–0.48)

## 2013-10-22 SURGERY — LEFT HEART CATHETERIZATION WITH CORONARY ANGIOGRAM
Anesthesia: LOCAL

## 2013-10-22 MED ORDER — BIOTIN 10 MG PO CAPS: 1.0000 | ORAL_CAPSULE | Freq: Every day | ORAL | Status: DC

## 2013-10-22 MED ORDER — SODIUM CHLORIDE 0.9 % IJ SOLN: 3.0000 mL | INTRAMUSCULAR | Status: DC | PRN

## 2013-10-22 MED ORDER — VERAPAMIL HCL 2.5 MG/ML IV SOLN
INTRAVENOUS | Status: AC
  Filled 2013-10-22: qty 2

## 2013-10-22 MED ORDER — SODIUM CHLORIDE 0.9 % IV SOLN
INTRAVENOUS | Status: DC
  Administered 2013-10-22: 12:00:00 via INTRAVENOUS

## 2013-10-22 MED ORDER — LUTEIN 20 MG PO CAPS: 1.0000 | ORAL_CAPSULE | Freq: Every day | ORAL | Status: DC

## 2013-10-22 MED ORDER — HEPARIN (PORCINE) IN NACL 2-0.9 UNIT/ML-% IJ SOLN
INTRAMUSCULAR | Status: AC
  Filled 2013-10-22: qty 1500

## 2013-10-22 MED ORDER — MIDAZOLAM HCL 2 MG/2ML IJ SOLN
INTRAMUSCULAR | Status: AC
  Filled 2013-10-22: qty 2

## 2013-10-22 MED ORDER — ASPIRIN 81 MG PO CHEW
81.0000 mg | CHEWABLE_TABLET | ORAL | Status: AC
  Administered 2013-10-22: 81 mg via ORAL
  Filled 2013-10-22: qty 1

## 2013-10-22 MED ORDER — ADULT MULTIVITAMIN W/MINERALS CH
1.0000 | ORAL_TABLET | Freq: Every day | ORAL | Status: DC
  Administered 2013-10-22 – 2013-10-23 (×2): 1 via ORAL
  Filled 2013-10-22 (×3): qty 1

## 2013-10-22 MED ORDER — SODIUM CHLORIDE 0.9 % IV SOLN: 250.0000 mL | INTRAVENOUS | Status: DC | PRN

## 2013-10-22 MED ORDER — SODIUM CHLORIDE 0.9 % IV SOLN
INTRAVENOUS | Status: AC
  Administered 2013-10-22: 17:00:00 via INTRAVENOUS

## 2013-10-22 MED ORDER — LEVOTHYROXINE SODIUM 100 MCG PO TABS
100.0000 ug | ORAL_TABLET | Freq: Every day | ORAL | Status: DC
  Administered 2013-10-22 – 2013-10-23 (×2): 100 ug via ORAL
  Filled 2013-10-22 (×2): qty 1

## 2013-10-22 MED ORDER — HEPARIN SODIUM (PORCINE) 5000 UNIT/ML IJ SOLN
5000.0000 [IU] | Freq: Three times a day (TID) | INTRAMUSCULAR | Status: DC
  Administered 2013-10-22 – 2013-10-23 (×2): 5000 [IU] via SUBCUTANEOUS
  Filled 2013-10-22 (×2): qty 1

## 2013-10-22 MED ORDER — PANTOPRAZOLE SODIUM 40 MG PO TBEC
40.0000 mg | DELAYED_RELEASE_TABLET | Freq: Two times a day (BID) | ORAL | Status: DC
  Administered 2013-10-22 – 2013-10-23 (×3): 40 mg via ORAL
  Filled 2013-10-22 (×3): qty 1

## 2013-10-22 MED ORDER — SODIUM CHLORIDE 0.9 % IV SOLN: INTRAVENOUS | Status: DC

## 2013-10-22 MED ORDER — LORATADINE 10 MG PO TABS
10.0000 mg | ORAL_TABLET | Freq: Every day | ORAL | Status: DC
  Administered 2013-10-22 – 2013-10-23 (×2): 10 mg via ORAL
  Filled 2013-10-22 (×2): qty 1

## 2013-10-22 MED ORDER — AMITRIPTYLINE HCL 25 MG PO TABS
25.0000 mg | ORAL_TABLET | Freq: Every day | ORAL | Status: DC
  Administered 2013-10-22: 25 mg via ORAL
  Filled 2013-10-22 (×2): qty 1

## 2013-10-22 MED ORDER — NITROGLYCERIN 1 MG/10 ML FOR IR/CATH LAB
INTRA_ARTERIAL | Status: AC
  Filled 2013-10-22: qty 10

## 2013-10-22 MED ORDER — FENTANYL CITRATE 0.05 MG/ML IJ SOLN
INTRAMUSCULAR | Status: AC
  Filled 2013-10-22: qty 2

## 2013-10-22 MED ORDER — BUPIVACAINE HCL (PF) 0.25 % IJ SOLN
INTRAMUSCULAR | Status: AC
  Filled 2013-10-22: qty 30

## 2013-10-22 MED ORDER — PANTOPRAZOLE SODIUM 40 MG PO TBEC: 40.0000 mg | DELAYED_RELEASE_TABLET | Freq: Every day | ORAL | Status: DC

## 2013-10-22 MED ORDER — SODIUM CHLORIDE 0.9 % IJ SOLN: 3.0000 mL | Freq: Two times a day (BID) | INTRAMUSCULAR | Status: DC

## 2013-10-22 MED ORDER — ACETAMINOPHEN 325 MG PO TABS
650.0000 mg | ORAL_TABLET | Freq: Four times a day (QID) | ORAL | Status: DC | PRN
  Administered 2013-10-22: 650 mg via ORAL
  Filled 2013-10-22: qty 2

## 2013-10-22 MED ORDER — ACETAMINOPHEN 650 MG RE SUPP: 650.0000 mg | Freq: Four times a day (QID) | RECTAL | Status: DC | PRN

## 2013-10-22 MED ORDER — FLUTICASONE PROPIONATE 50 MCG/ACT NA SUSP
2.0000 | Freq: Every day | NASAL | Status: DC
  Administered 2013-10-22 – 2013-10-23 (×2): 2 via NASAL
  Filled 2013-10-22: qty 16

## 2013-10-22 MED ORDER — ATORVASTATIN CALCIUM 20 MG PO TABS: 20.0000 mg | ORAL_TABLET | Freq: Every day | ORAL | Status: DC

## 2013-10-22 MED ORDER — SODIUM CHLORIDE 0.9 % IJ SOLN
3.0000 mL | Freq: Two times a day (BID) | INTRAMUSCULAR | Status: DC
  Administered 2013-10-22 – 2013-10-23 (×3): 3 mL via INTRAVENOUS

## 2013-10-22 MED ORDER — SODIUM CHLORIDE 0.9 % IJ SOLN
3.0000 mL | Freq: Two times a day (BID) | INTRAMUSCULAR | Status: DC
  Administered 2013-10-22: 3 mL via INTRAVENOUS

## 2013-10-22 MED ORDER — NITROGLYCERIN 0.4 MG SL SUBL: 0.4000 mg | SUBLINGUAL_TABLET | SUBLINGUAL | Status: DC | PRN

## 2013-10-22 MED ORDER — HEPARIN SODIUM (PORCINE) 1000 UNIT/ML IJ SOLN
INTRAMUSCULAR | Status: AC
  Filled 2013-10-22: qty 1

## 2013-10-22 MED ORDER — DILTIAZEM HCL ER COATED BEADS 120 MG PO CP24: 120.0000 mg | ORAL_CAPSULE | Freq: Every day | ORAL | Status: DC

## 2013-10-22 NOTE — Interval H&P Note (Signed)
History and Physical Interval Note:  10/22/2013 3:32 PM  Jill Dean  has presented today for surgery, with the diagnosis of cp  The various methods of treatment have been discussed with the patient and family. After consideration of risks, benefits and other options for treatment, the patient has consented to  Procedure(s): LEFT HEART CATHETERIZATION WITH CORONARY ANGIOGRAM (N/A) as a surgical intervention .  The patient's history has been reviewed, patient examined, no change in status, stable for surgery.  I have reviewed the patient's chart and labs.  Questions were answered to the patient's satisfaction.     Jill Dean

## 2013-10-22 NOTE — ED Notes (Signed)
Patient ambulated to the BR without difficulty

## 2013-10-22 NOTE — Progress Notes (Signed)
  Echocardiogram 2D Echocardiogram has been performed.  Jill Dean FRANCES 10/22/2013, 9:45 AM

## 2013-10-22 NOTE — Progress Notes (Signed)
PHARMACIST - PHYSICIAN ORDER COMMUNICATION  CONCERNING: P&T Medication Policy on Herbal Medications  DESCRIPTION:  This patient's order for:  Biotin and Lutein  has been noted.  This product(s) is classified as an "herbal" or natural product. Due to a lack of definitive safety studies or FDA approval, nonstandard manufacturing practices, plus the potential risk of unknown drug-drug interactions while on inpatient medications, the Pharmacy and Therapeutics Committee does not permit the use of "herbal" or natural products of this type within [facility].   ACTION TAKEN: The pharmacy department is unable to verify this order at this time and your patient has been informed of this safety policy. Please reevaluate patient's clinical condition at discharge and address if the herbal or natural product(s) should be resumed at that time.

## 2013-10-22 NOTE — H&P (View-Only) (Signed)
CARDIOLOGY CONSULT NOTE   Patient ID: Jill Dean MRN: [id number] DOB/AGE: [birth year] 68 y.o.  Admit Date: 10/21/2013  Primary Physician: Horton Finer, MD  Primary Cardiologist:    Candee Furbish.   Clinical Summary Jill Dean is a 68 y.o.female. Cardiology is consulted to help with evaluation of chest pain.  On October 15, 2013, the patient felt poorly and was noted to have a rapid heartbeat. EMS reported a heart rate of 200. There were no strips available. Vagal maneuvers  were done and the heart rate slowed into the range of 120. The patient was hospitalized. She was seen by Dr.Skains of the cardiology team during the hospitalization. There was no evidence of an MI. Nuclear stress study showed no significant abnormality. The ejection fraction was greater than 70% by nuclear scan. There is mention of obtaining a two-dimensional echo. However I do not think a two-dimensional echo was done. The patient was allowed to go home with plans for followup with Dr. Marlou Porch. She was discharged on diltiazem.  The patient thinks that the diltiazem made her feel poorly this week with decreased energy. The patient had a regular supper on the evening of October 21, 2013. She was sitting in her recliner. She ate an apple. She then had sudden onset of significant chest heaviness. She did not have nausea vomiting and diaphoresis. There was no radiation. There was no significant shortness of breath. She did not note a rapid heartbeat. She asked her husband to call 911 immediately. This was done she was transported to the hospital. She received several nitroglycerin. She says that the nitroglycerin did not seem to help. He did give her a headache. Her chest pain was improved and she was being moved to the stretcher in the hospital. She says that the pain then returned and she's had some discomfort during the night. There is no significant pain with movement or palpation. So far several troponins are normal.  EKG reveals her old right bundle branch block. There are some lateral ST changes. The seem a little more marked than on her prior tracings. D-dimer has been done and it is top normal.   Allergies  Allergen Reactions  . Sulfonamide Derivatives Anaphylaxis and Swelling  . Azithromycin     REACTION: itching  . Clarithromycin     REACTION: severe itching  . Codeine Itching  . Dilaudid [Hydromorphone Hcl] Itching  . Hydrocod Polst-Cpm Polst Er Other (See Comments)    "hypes me up."  . Lidocaine     "makes me shake"  . Morphine And Related Itching  . Penicillins Itching  . Pseudoephedrine     REACTION: tachycardia  . Shrimp [Shellfish Allergy] Itching    Medications Scheduled Medications: . amitriptyline  25 mg Oral QHS  . atorvastatin  20 mg Oral q1800  . diltiazem  120 mg Oral Daily  . fluticasone  2 spray Each Nare Daily  . heparin  5,000 Units Subcutaneous 3 times per day  . levothyroxine  100 mcg Oral QAC breakfast  . loratadine  10 mg Oral Daily  . multivitamin with minerals  1 tablet Oral Daily  . pantoprazole  40 mg Oral Daily  . sodium chloride  3 mL Intravenous Q12H  . sodium chloride  3 mL Intravenous Q12H     Infusions:     PRN Medications:  sodium chloride, acetaminophen, acetaminophen, nitroGLYCERIN, sodium chloride   Past Medical History  Diagnosis Date  . Osteoarthrosis, unspecified whether generalized or localized, unspecified  site   . Raynaud's syndrome   . Urticaria, unspecified   . Other chronic allergic conjunctivitis   . Allergic rhinitis, cause unspecified   . Fibroid     Ruptured Left endometrioma  . Thyroid disease     Hypothyroid  . Endometriosis   . Diabetes mellitus without complication   . DM (diabetes mellitus)     Non-insulin-dependent; diet controlled  . Obesity   . HLD (hyperlipidemia)   . Chest pain     a. s/p normal ett nuclear stress test on 10/16/13    Past Surgical History  Procedure Laterality Date  . Thyroid  nodule    . Knot right breast    . Tonsillectomy    . Vesicovaginal fistula closure w/ tah    . Cholecystectomy    . Oophorectomy      LSO 84-RSO 04  . Vaginal hysterectomy  2004    [facility]    Family History  Problem Relation Age of Onset  . Lung cancer Father   . Heart disease Father   . Heart failure Mother   . Dementia Mother     Social History Jill Dean reports that she has never smoked. She has never used smokeless tobacco. Jill Dean reports that she drinks alcohol.  Review of Systems   Patient denies fever, chills, headache, sweats, rash, change in vision, change in hearing, cough, nausea vomiting, urinary symptoms. All other systems are reviewed and are negative.   Physical Examination Blood pressure 104/58, pulse 72, temperature 97.7 F (36.5 C), temperature source Oral, resp. rate 18, height 5\' 3"  (1.6 m), weight 209 lb 9.6 oz (95.074 kg), SpO2 100.00%.  Intake/Output Summary (Last 24 hours) at 10/22/13 0841 Last data filed at 10/22/13 0253  Gross per 24 hour  Intake    120 ml  Output      0 ml  Net    120 ml   Patient is overweight. She is oriented to person time and place. Affect is normal. She stable at this time although she says she still has some vague chest discomfort. Head is atraumatic. Sclera and conjunctiva are normal. There is no jugulovenous distention. Lungs are clear. Respiratory effort is nonlabored. Cardiac exam reveals S1 and S2. The abdomen is soft. There is no peripheral edema. There are no musculoskeletal deformities. There are no skin rashes.   Prior Cardiac Testing/Procedures  Lab Results  Basic Metabolic Panel:  Recent Labs Lab 10/15/13 1757 10/16/13 0510 10/21/13 2310  NA 143 141 139  K 4.1 4.1 4.2  CL 101 101 100  CO2 28 27 28   GLUCOSE 137* 132* 123*  BUN 16 17 15   CREATININE 0.83 0.86 0.78  CALCIUM 9.4 8.9 9.1    Liver Function Tests: No results found for this basename: AST, ALT, ALKPHOS, BILITOT, PROT, ALBUMIN,  in  the last 168 hours  CBC:  Recent Labs Lab 10/15/13 1757 10/21/13 2310  WBC 8.8 9.2  NEUTROABS 4.8  --   HGB 13.4 12.6  HCT 39.5 37.9  MCV 85.5 85.4  PLT 253 271    Cardiac Enzymes:  Recent Labs Lab 10/16/13 0030 10/16/13 0510 10/16/13 1026 10/22/13 0409  TROPONINI <0.30 <0.30 <0.30 <0.30    BNP: No components found with this basename: POCBNP,    Radiology: Dg Chest 2 View  10/21/2013   CLINICAL DATA:  68 year old female with acute chest pain and chest tightness. Hypertension and tachycardia. Initial encounter.  EXAM: CHEST  2 VIEW  COMPARISON:  10/15/2013.  FINDINGS: Stable lung volumes, within normal limits. Normal cardiac size and mediastinal contours. Visualized tracheal air column is within normal limits. No pneumothorax, pulmonary edema, pleural effusion or confluent pulmonary opacity. Mildly increased interstitial markings are stable. No acute osseous abnormality identified.  IMPRESSION: No acute cardiopulmonary abnormality.   Electronically Signed   By: Lars Pinks M.D.   On: 10/21/2013 23:36     ECG:  I have reviewed the current and old EKGs. There sinus rhythm. There is old right bundle branch block. There are mild lateral ST changes. Currently these may be more marked than on the prior tracings.  Telemetry:    I personally reviewed telemetry today October 22, 2013. There is normal sinus rhythm.   Impression and Recommendations    Chest pressure      Etiology of the patient's current chest pressure is not clear. This is a second hospitalization within 10 days. Her enzymes are negative. She continues to have some discomfort in the hospital. The d-dimer is top normal. EKG reveals slight lateral ST abnormalities. These or not diagnostic. She did not appear to have a rapid heartbeat at the time of this chest pressure. Her nuclear scan done last week showed no significant abnormalities. I am concerned about her current pain. I've considered whether or cardiac CT or  cardiac catheterization would be appropriate. At the current time I feel that cardiac catheterization would be more appropriate. I am waiting to see her 2-D echo. I will then communicate further with the patient to make a final plan. I have changed her diet to n.p.o. at 9:43AM today. I have started IV fluids for hydration. It is possible that her symptoms are related to GERD. However in the past, she has had significant regurgitation with her GERD when she was having problems. She is not having this problem at this time. Despite this, I have increased her PPI to twice a day. An additional question as to whether she could have esophageal spasm. This problem would probably be helped by the diltiazem that she is currently on.  /////////  10:10AM,  Two-dimensional echo has been done. The ejection fraction is 65%. I have personally reviewed this study. There is no evidence of outflow tract obstruction. I have spoken again with the patient. She and I both agree that it would be most prudent to proceed with cardiac catheterization today. I discussed the procedure with her. I've spoken with the cath lab to schedule the procedure. I have written the orders.    Raynaud's syndrome     She is not exhibiting any Raynaud's symptoms at this time.    GERD     I've increased her PPI to twice a day.    Hypothyroidism     DM (diabetes mellitus)      By history this is controlled with diet.    HLD (hyperlipidemia)         History of tachycardia     On October 15, 2013 she had an episode of rapid heartbeat. When assessed by EMS it was felt that her heart rate was in the range of 200. There are no strips. The rhythm was monitored vagal maneuvers. The possibility of recurrent arrhythmias must be kept in mind. The patient feel she did not have tachycardia palpitations with her chest pain last night. The patient says that she feels poorly with diltiazem. I have discontinued the medicine this morning. Decision will have to be  made about what other medicines she should go home on  for the recently documented rapid heartbeat as an outpatient responding to vagal maneuvers.    RBBB (right bundle branch block)     There is old right bundle branch block.   Drug effect     Patient says she feels poorly with diltiazem. I'm not sure exactly why this would be the case. However, I am stopping her diltiazem this morning.  Daryel November, MD  10/22/2013, 8:41 AM Updated 10:10AM   Ron Parker

## 2013-10-22 NOTE — H&P (Signed)
[facility] History and Physical  [name]:[id number] DOB: [birth year] DOA: 10/21/2013  Referring physician: ED physician PCP: Horton Finer, MD  Specialists:   Chief Complaint: chest pressure  HPI: Jill Dean is a 68 y.o. female with PMH of diabetes mellitus, GERD, hyperlipidemia, hypothyroidism, Raynaud's syndrome, who present with chest pressure.   Patient was recently hospitalized because of similar presentation. She had negative Myoview test on 10/16/13. Patient reports that at about 9 PM, when she was watching TV, she started having chest pressure feeling. It is located at substernal area. It is constant, and intermittently accentuated. She does not have fever, chills, cough, shortness of breath, nausea, vomiting, abdominal pain, diarrhea. No pain over calf areas.   Patient initial troponin was negative. EKG showed old right bundle block. CXR is negative. Patient is admitted to inpatient for further evaluation treatment.  Review of Systems: As presented in the history of presenting illness, rest negative.  Where does patient live?  Lives with her husband at home. Can patient participate in ADLs? Yes  Allergy:  Allergies  Allergen Reactions  . Sulfonamide Derivatives Anaphylaxis and Swelling  . Azithromycin     REACTION: itching  . Clarithromycin     REACTION: severe itching  . Codeine Itching  . Dilaudid [Hydromorphone Hcl] Itching  . Hydrocod Polst-Cpm Polst Er Other (See Comments)    "hypes me up."  . Lidocaine     "makes me shake"  . Morphine And Related Itching  . Penicillins Itching  . Pseudoephedrine     REACTION: tachycardia  . Shrimp [Shellfish Allergy] Itching    Past Medical History  Diagnosis Date  . Osteoarthrosis, unspecified whether generalized or localized, unspecified site   . Raynaud's syndrome   . Urticaria, unspecified   . Other chronic allergic conjunctivitis   . Allergic rhinitis, cause unspecified   . Fibroid    Ruptured Left endometrioma  . Thyroid disease     Hypothyroid  . Endometriosis   . Diabetes mellitus without complication   . DM (diabetes mellitus)     Non-insulin-dependent; diet controlled  . Obesity   . HLD (hyperlipidemia)   . Chest pain     a. s/p normal ett nuclear stress test on 10/16/13    Past Surgical History  Procedure Laterality Date  . Thyroid nodule    . Knot right breast    . Tonsillectomy    . Vesicovaginal fistula closure w/ tah    . Cholecystectomy    . Oophorectomy      LSO 84-RSO 04  . Vaginal hysterectomy  2004    [facility]    Social History:  reports that she has never smoked. She has never used smokeless tobacco. She reports that she drinks alcohol. She reports that she does not use illicit drugs.  Family History:  Family History  Problem Relation Age of Onset  . Lung cancer Father   . Heart disease Father   . Heart failure Mother   . Dementia Mother      Prior to Admission medications   Medication Sig Start Date End Date Taking? Authorizing Provider  amitriptyline (ELAVIL) 25 MG tablet Take 25 mg by mouth at bedtime.     Yes Historical Provider, MD  Biotin 10 MG CAPS Take 1 capsule by mouth daily.   Yes Historical Provider, MD  cetirizine (ZYRTEC) 10 MG tablet Take 10 mg by mouth daily.     Yes Historical Provider, MD  diltiazem (CARDIZEM CD) 120 MG 24  hr capsule Take 1 capsule (120 mg total) by mouth daily. 10/16/13  Yes Eileen Stanford, PA-C  fluticasone (FLONASE) 50 MCG/ACT nasal spray Place 2 sprays into both nostrils daily.   Yes Historical Provider, MD  levothyroxine (SYNTHROID, LEVOTHROID) 100 MCG tablet Take 100 mcg by mouth daily.     Yes Historical Provider, MD  Lutein 20 MG CAPS Take 1 capsule by mouth daily.     Yes Historical Provider, MD  Multiple Vitamin (MULTIVITAMIN) capsule Take 1 capsule by mouth daily.     Yes Historical Provider, MD  omeprazole (PRILOSEC) 20 MG capsule Take 20 mg by mouth daily.     Yes Historical  Provider, MD    Physical Exam: Filed Vitals:   10/22/13 0100 10/22/13 0115 10/22/13 0130 10/22/13 0145  BP: 104/57 120/67 110/59 113/62  Pulse: 77 78 80 74  Temp:      TempSrc:      Resp: 17 21 21 12   SpO2: 93% 97% 94% 98%   General: Not in acute distress HEENT:       Eyes: PERRL, EOMI, no scleral icterus       ENT: No discharge from the ears and nose, no pharynx injection, no tonsillar enlargement.        Neck: No JVD, no bruit, no mass felt. Cardiac: S1/S2, RRR, No murmurs, gallops or rubs Pulm: Good air movement bilaterally. Clear to auscultation bilaterally. No rales, wheezing, rhonchi or rubs. Abd: Soft, nondistended, nontender, no rebound pain, no organomegaly, BS present Ext: No edema. 2+DP/PT pulse bilaterally Musculoskeletal: No joint deformities, erythema, or stiffness, ROM full Skin: No rashes.  Neuro: Alert and oriented X3, cranial nerves II-XII grossly intact, muscle strength 5/5 in all extremeties, sensation to light touch intact.  Psych: Patient is not psychotic, no suicidal or hemocidal ideation.  Labs on Admission:  Basic Metabolic Panel:  Recent Labs Lab 10/15/13 1757 10/16/13 0510 10/21/13 2310  NA 143 141 139  K 4.1 4.1 4.2  CL 101 101 100  CO2 28 27 28   GLUCOSE 137* 132* 123*  BUN 16 17 15   CREATININE 0.83 0.86 0.78  CALCIUM 9.4 8.9 9.1   Liver Function Tests: No results found for this basename: AST, ALT, ALKPHOS, BILITOT, PROT, ALBUMIN,  in the last 168 hours No results found for this basename: LIPASE, AMYLASE,  in the last 168 hours No results found for this basename: AMMONIA,  in the last 168 hours CBC:  Recent Labs Lab 10/15/13 1757 10/21/13 2310  WBC 8.8 9.2  NEUTROABS 4.8  --   HGB 13.4 12.6  HCT 39.5 37.9  MCV 85.5 85.4  PLT 253 271   Cardiac Enzymes:  Recent Labs Lab 10/16/13 0030 10/16/13 0510 10/16/13 1026  TROPONINI <0.30 <0.30 <0.30    BNP (last 3 results) No results found for this basename: PROBNP,  in the last  8760 hours CBG: No results found for this basename: GLUCAP,  in the last 168 hours  Radiological Exams on Admission: Dg Chest 2 View  10/21/2013   CLINICAL DATA:  68 year old female with acute chest pain and chest tightness. Hypertension and tachycardia. Initial encounter.  EXAM: CHEST  2 VIEW  COMPARISON:  10/15/2013.  FINDINGS: Stable lung volumes, within normal limits. Normal cardiac size and mediastinal contours. Visualized tracheal air column is within normal limits. No pneumothorax, pulmonary edema, pleural effusion or confluent pulmonary opacity. Mildly increased interstitial markings are stable. No acute osseous abnormality identified.  IMPRESSION: No acute cardiopulmonary abnormality.   Electronically  Signed   By: Lars Pinks M.D.   On: 10/21/2013 23:36    EKG: Independently reviewed.   Assessment/Plan Principal Problem:   Chest pressure Active Problems:   GERD   Hypothyroidism   DM (diabetes mellitus)   HLD (hyperlipidemia)  1. chest pressure: Etiology is not clear. Patient just had a negative Myoview test last week. Potential differential diagnoses include GERD and Prinzmetal angina given her history of Raynaud's syndrome.  -will admit to tele bed - start lipitor. Statin was shown to be effective in treating coronary spasm from Nitric oxide release effect. - will not start ASA, since ASA may have opposite effect of Prinzmetal angina - continue Cardizem - Nitroglycerin SL when necessary - trop x 3 - repeat EKG in AM - check UDS - check d-dimer. If positive, will get CTA to r/o PE  2. GERD:  - continue PPI - patient may need outpt EGD to r/o gastric etiology for her chest pressure  3. Hypothyroidism: As her TSH was 5.24 on 10/16/13 -  Continue Synthroid  4.  HLD: not on statin at home. Last LDL was 139 on 10/16/13. -will start lipitor.   5. Dm-II: diet controlled - monitor CBG   DVT ppx: SQ Heparin    Code Status: Full code Family Communication:  Yes,  patient's husband  at bed side Disposition Plan: Admit to inpatient   Date of Service 10/22/2013    Ivor Costa [facility] Pager [phone]  If 7PM-7AM, please contact night-coverage [email or web address] Password [email or web address] 10/22/2013, 1:48 AM

## 2013-10-22 NOTE — ED Notes (Signed)
Second EKG done per Dr. Lita Mains.

## 2013-10-22 NOTE — Consult Note (Addendum)
CARDIOLOGY CONSULT NOTE   Patient ID: Jill Dean MRN: [id number] DOB/AGE: April 04, 1945 68 y.o.  Admit Date: 10/21/2013  Primary Physician: Horton Finer, MD  Primary Cardiologist:    Candee Furbish.   Clinical Summary Jill Dean is a 68 y.o.female. Cardiology is consulted to help with evaluation of chest pain.  On October 15, 2013, the patient felt poorly and was noted to have a rapid heartbeat. EMS reported a heart rate of 200. There were no strips available. Vagal maneuvers  were done and the heart rate slowed into the range of 120. The patient was hospitalized. She was seen by Dr.Skains of the cardiology team during the hospitalization. There was no evidence of an MI. Nuclear stress study showed no significant abnormality. The ejection fraction was greater than 70% by nuclear scan. There is mention of obtaining a two-dimensional echo. However I do not think a two-dimensional echo was done. The patient was allowed to go home with plans for followup with Dr. Marlou Porch. She was discharged on diltiazem.  The patient thinks that the diltiazem made her feel poorly this week with decreased energy. The patient had a regular supper on the evening of October 21, 2013. She was sitting in her recliner. She ate an apple. She then had sudden onset of significant chest heaviness. She did not have nausea vomiting and diaphoresis. There was no radiation. There was no significant shortness of breath. She did not note a rapid heartbeat. She asked her husband to call 911 immediately. This was done she was transported to the hospital. She received several nitroglycerin. She says that the nitroglycerin did not seem to help. He did give her a headache. Her chest pain was improved and she was being moved to the stretcher in the hospital. She says that the pain then returned and she's had some discomfort during the night. There is no significant pain with movement or palpation. So far several troponins are normal.  EKG reveals her old right bundle branch block. There are some lateral ST changes. The seem a little more marked than on her prior tracings. D-dimer has been done and it is top normal.   Allergies  Allergen Reactions  . Sulfonamide Derivatives Anaphylaxis and Swelling  . Azithromycin     REACTION: itching  . Clarithromycin     REACTION: severe itching  . Codeine Itching  . Dilaudid [Hydromorphone Hcl] Itching  . Hydrocod Polst-Cpm Polst Er Other (See Comments)    "hypes me up."  . Lidocaine     "makes me shake"  . Morphine And Related Itching  . Penicillins Itching  . Pseudoephedrine     REACTION: tachycardia  . Shrimp [Shellfish Allergy] Itching    Medications Scheduled Medications: . amitriptyline  25 mg Oral QHS  . atorvastatin  20 mg Oral q1800  . diltiazem  120 mg Oral Daily  . fluticasone  2 spray Each Nare Daily  . heparin  5,000 Units Subcutaneous 3 times per day  . levothyroxine  100 mcg Oral QAC breakfast  . loratadine  10 mg Oral Daily  . multivitamin with minerals  1 tablet Oral Daily  . pantoprazole  40 mg Oral Daily  . sodium chloride  3 mL Intravenous Q12H  . sodium chloride  3 mL Intravenous Q12H     Infusions:     PRN Medications:  sodium chloride, acetaminophen, acetaminophen, nitroGLYCERIN, sodium chloride   Past Medical History  Diagnosis Date  . Osteoarthrosis, unspecified whether generalized or localized, unspecified  site   . Raynaud's syndrome   . Urticaria, unspecified   . Other chronic allergic conjunctivitis   . Allergic rhinitis, cause unspecified   . Fibroid     Ruptured Left endometrioma  . Thyroid disease     Hypothyroid  . Endometriosis   . Diabetes mellitus without complication   . DM (diabetes mellitus)     Non-insulin-dependent; diet controlled  . Obesity   . HLD (hyperlipidemia)   . Chest pain     a. s/p normal ett nuclear stress test on 10/16/13    Past Surgical History  Procedure Laterality Date  . Thyroid  nodule    . Knot right breast    . Tonsillectomy    . Vesicovaginal fistula closure w/ tah    . Cholecystectomy    . Oophorectomy      LSO 84-RSO 04  . Vaginal hysterectomy  2004    [facility] RSO    Family History  Problem Relation Age of Onset  . Lung cancer Father   . Heart disease Father   . Heart failure Mother   . Dementia Mother     Social History Jill Dean reports that she has never smoked. She has never used smokeless tobacco. Jill Dean reports that she drinks alcohol.  Review of Systems   Patient denies fever, chills, headache, sweats, rash, change in vision, change in hearing, cough, nausea vomiting, urinary symptoms. All other systems are reviewed and are negative.   Physical Examination Blood pressure 104/58, pulse 72, temperature 97.7 F (36.5 C), temperature source Oral, resp. rate 18, height 5\' 3"  (1.6 m), weight 209 lb 9.6 oz (95.074 kg), SpO2 100.00%.  Intake/Output Summary (Last 24 hours) at 10/22/13 0841 Last data filed at 10/22/13 0253  Gross per 24 hour  Intake    120 ml  Output      0 ml  Net    120 ml   Patient is overweight. She is oriented to person time and place. Affect is normal. She stable at this time although she says she still has some vague chest discomfort. Head is atraumatic. Sclera and conjunctiva are normal. There is no jugulovenous distention. Lungs are clear. Respiratory effort is nonlabored. Cardiac exam reveals S1 and S2. The abdomen is soft. There is no peripheral edema. There are no musculoskeletal deformities. There are no skin rashes.   Prior Cardiac Testing/Procedures  Lab Results  Basic Metabolic Panel:  Recent Labs Lab 10/15/13 1757 10/16/13 0510 10/21/13 2310  NA 143 141 139  K 4.1 4.1 4.2  CL 101 101 100  CO2 28 27 28   GLUCOSE 137* 132* 123*  BUN 16 17 15   CREATININE 0.83 0.86 0.78  CALCIUM 9.4 8.9 9.1    Liver Function Tests: No results found for this basename: AST, ALT, ALKPHOS, BILITOT, PROT, ALBUMIN,  in  the last 168 hours  CBC:  Recent Labs Lab 10/15/13 1757 10/21/13 2310  WBC 8.8 9.2  NEUTROABS 4.8  --   HGB 13.4 12.6  HCT 39.5 37.9  MCV 85.5 85.4  PLT 253 271    Cardiac Enzymes:  Recent Labs Lab 10/16/13 0030 10/16/13 0510 10/16/13 1026 10/22/13 0409  TROPONINI <0.30 <0.30 <0.30 <0.30    BNP: No components found with this basename: POCBNP,    Radiology: Dg Chest 2 View  10/21/2013   CLINICAL DATA:  68 year old female with acute chest pain and chest tightness. Hypertension and tachycardia. Initial encounter.  EXAM: CHEST  2 VIEW  COMPARISON:  10/15/2013.  FINDINGS: Stable lung volumes, within normal limits. Normal cardiac size and mediastinal contours. Visualized tracheal air column is within normal limits. No pneumothorax, pulmonary edema, pleural effusion or confluent pulmonary opacity. Mildly increased interstitial markings are stable. No acute osseous abnormality identified.  IMPRESSION: No acute cardiopulmonary abnormality.   Electronically Signed   By: Lars Pinks M.D.   On: 10/21/2013 23:36     ECG:  I have reviewed the current and old EKGs. There sinus rhythm. There is old right bundle branch block. There are mild lateral ST changes. Currently these may be more marked than on the prior tracings.  Telemetry:    I personally reviewed telemetry today October 22, 2013. There is normal sinus rhythm.   Impression and Recommendations    Chest pressure      Etiology of the patient's current chest pressure is not clear. This is a second hospitalization within 10 days. Her enzymes are negative. She continues to have some discomfort in the hospital. The d-dimer is top normal. EKG reveals slight lateral ST abnormalities. These or not diagnostic. She did not appear to have a rapid heartbeat at the time of this chest pressure. Her nuclear scan done last week showed no significant abnormalities. I am concerned about her current pain. I've considered whether or cardiac CT or  cardiac catheterization would be appropriate. At the current time I feel that cardiac catheterization would be more appropriate. I am waiting to see her 2-D echo. I will then communicate further with the patient to make a final plan. I have changed her diet to n.p.o. at 9:43AM today. I have started IV fluids for hydration. It is possible that her symptoms are related to GERD. However in the past, she has had significant regurgitation with her GERD when she was having problems. She is not having this problem at this time. Despite this, I have increased her PPI to twice a day. An additional question as to whether she could have esophageal spasm. This problem would probably be helped by the diltiazem that she is currently on.  /////////  10:10AM,  Two-dimensional echo has been done. The ejection fraction is 65%. I have personally reviewed this study. There is no evidence of outflow tract obstruction. I have spoken again with the patient. She and I both agree that it would be most prudent to proceed with cardiac catheterization today. I discussed the procedure with her. I've spoken with the cath lab to schedule the procedure. I have written the orders.    Raynaud's syndrome     She is not exhibiting any Raynaud's symptoms at this time.    GERD     I've increased her PPI to twice a day.    Hypothyroidism     DM (diabetes mellitus)      By history this is controlled with diet.    HLD (hyperlipidemia)         History of tachycardia     On October 15, 2013 she had an episode of rapid heartbeat. When assessed by EMS it was felt that her heart rate was in the range of 200. There are no strips. The rhythm was monitored vagal maneuvers. The possibility of recurrent arrhythmias must be kept in mind. The patient feel she did not have tachycardia palpitations with her chest pain last night. The patient says that she feels poorly with diltiazem. I have discontinued the medicine this morning. Decision will have to be  made about what other medicines she should go home on  for the recently documented rapid heartbeat as an outpatient responding to vagal maneuvers.    RBBB (right bundle branch block)     There is old right bundle branch block.   Drug effect     Patient says she feels poorly with diltiazem. I'm not sure exactly why this would be the case. However, I am stopping her diltiazem this morning.  Daryel November, MD  10/22/2013, 8:41 AM Updated 10:10AM   Ron Parker

## 2013-10-22 NOTE — CV Procedure (Signed)
   Cardiac Catheterization Procedure Note  Name: Jill Dean MRN: [id number] DOB: 06-14-1945  Procedure: Left Heart Cath, Selective Coronary Angiography, LV angiography  Indication:  Recurrent chest pain.  Medications:  Sedation:  1 mg IV Versed, 50 mcg IV Fentanyl  Contrast:  20 ml Omnipaque   Procedural Details: The right wrist was prepped, draped, and anesthetized with 1% lidocaine. Using the modified Seldinger technique, a 5 French sheath was introduced into the right radial artery. 3 mg of verapamil was administered through the sheath, weight-based unfractionated heparin was administered intravenously. A Jackie catheter was used for selective coronary angiography and measurement of left ventricular pressure. There were no immediate procedural complications. A TR band was used for radial hemostasis at the completion of the procedure.  The patient was transferred to the post catheterization recovery area for further monitoring.  Procedural Findings:  Hemodynamics: AO:  134/72   mmHg LV:  138/16    mmHg LVEDP: 18  mmHg  Coronary angiography: Coronary dominance: left   Left Main:   normal   Left Anterior Descending (LAD):  normal.   1st diagonal (D1):   large in size and normal.   2nd diagonal (D2):   is very small in size.   3rd diagonal (D3):   very small in size and normal.   Circumflex (LCx):  large in size and dominant. The vessel has no significant disease.   1st obtuse marginal:   large in size with no significant disease.   2nd obtuse marginal:   normal in size with no significant disease   3rd obtuse marginal:   normal in size with no significant disease.    AV groove continuation segment:  large in size with no significant disease. The left PDA and PLs are normal.   Right Coronary Artery: small in size with no significant disease.    Left ventriculography:  was not performed. Ejection fraction was normal by echo.   Final Conclusions:    1. Normal coronary  arteries.   2. Normal LV systolic function by noninvasive testing with mildly elevated left ventricular end-diastolic pressure.  Recommendations:   the chest pain does not seem to be cardiac.   Kathlyn Sacramento MD, [facility] 10/22/2013, 4:02 PM

## 2013-10-22 NOTE — Progress Notes (Signed)
PROGRESS NOTE    MARCHE HOTTENSTEIN OZD:[id number] DOB: [birth year] DOA: 10/21/2013 PCP: Horton Finer, MD  HPI/Brief narrative 68 year old female patient with history of DM 2, GERD, HLD, hypothyroid, Raynaud's syndrome, recently hospitalized for SVT discharge by cardiology on 10/15 after negative stress test, returned with midsternal chest pressure at rest that began on 10/20 and feeling poorly on newly started Cardizem. Cardiology consulted and plans cardiac cath 10/21.   Assessment/Plan:  1. Chest pressure: DD- R/o CAD, GERD/esophageal spasm, muscular. Symptoms not typical to her GERD symptoms. No relief with sublingual nitroglycerin or oral Maalox. Does not appear muscular or pleuritic in nature. Ruled out for MI and d-dimer upper limit of normal. A recent stress test negative. Cardiology was consulted and plan cardiac cath later today for definitive evaluation of CAD. Chest x-ray negative. UDS negative 2. Recent SVT: Patient states that she has not been able to tolerate diltiazem which is making her extremely lethargic/week. Cardiology has discontinued diltiazem and await their recommendation regarding alternate agent. 3. GERD: PPI increase to twice a day. 4. Hypothyroid: TSH 5.24 and free T4-1.07. Continue current levothyroxine dose and recommend repeating TSH in 4-6 weeks. 5. Type II DM: A1c 7. Diet controlled at home. 6. Hyperlipidemia: Continue statins 7. Raynaud's syndrome: Stable 8. Old RBBB   Code Status: Full Family Communication: None bedside Disposition Plan: Home when medically stable   Consultants:  Cardiology  Procedures:  None  Antibiotics:  None   Subjective: Stated that her midsternal chest pressure was gradually returning this morning and rated as 5/10 in severity. Associated nausea but no vomiting.  Objective: Filed Vitals:   10/22/13 0252 10/22/13 0556 10/22/13 0759 10/22/13 0804  BP: 138/68 111/61 89/42 104/58  Pulse: 81 86 72   Temp: 97.9 F  (36.6 C) 97.8 F (36.6 C) 97.7 F (36.5 C)   TempSrc: Oral Oral Oral   Resp: 20 18 18    Height: 5\' 3"  (1.6 m)     Weight: 95.074 kg (209 lb 9.6 oz)     SpO2: 95% 94% 100%     Intake/Output Summary (Last 24 hours) at 10/22/13 1407 Last data filed at 10/22/13 0900  Gross per 24 hour  Intake    120 ml  Output      0 ml  Net    120 ml   Filed Weights   10/22/13 0252  Weight: 95.074 kg (209 lb 9.6 oz)     Exam:  General exam: Pleasant middle-aged female sitting comfortably at edge of bed. Respiratory system: Clear. No increased work of breathing. No reproducible chest discomfort. Cardiovascular system: S1 & S2 heard, RRR. No JVD, murmurs, gallops, clicks or pedal edema. Telemetry: Sinus rhythm Gastrointestinal system: Abdomen is nondistended, soft and nontender. Normal bowel sounds heard. Central nervous system: Alert and oriented. No focal neurological deficits. Extremities: Symmetric 5 x 5 power.   Data Reviewed: Basic Metabolic Panel:  Recent Labs Lab 10/15/13 1757 10/16/13 0510 10/21/13 2310 10/22/13 0854  NA 143 141 139 140  K 4.1 4.1 4.2 4.2  CL 101 101 100 101  CO2 28 27 28 25   GLUCOSE 137* 132* 123* 135*  BUN 16 17 15 17   CREATININE 0.83 0.86 0.78 0.68  CALCIUM 9.4 8.9 9.1 9.3   Liver Function Tests: No results found for this basename: AST, ALT, ALKPHOS, BILITOT, PROT, ALBUMIN,  in the last 168 hours No results found for this basename: LIPASE, AMYLASE,  in the last 168 hours No results found for this basename: AMMONIA,  in the last 168 hours CBC:  Recent Labs Lab 10/15/13 1757 10/21/13 2310  WBC 8.8 9.2  NEUTROABS 4.8  --   HGB 13.4 12.6  HCT 39.5 37.9  MCV 85.5 85.4  PLT 253 271   Cardiac Enzymes:  Recent Labs Lab 10/16/13 0030 10/16/13 0510 10/16/13 1026 10/22/13 0409 10/22/13 0854  TROPONINI <0.30 <0.30 <0.30 <0.30 <0.30   BNP (last 3 results) No results found for this basename: PROBNP,  in the last 8760 hours CBG:  Recent  Labs Lab 10/22/13 0247 10/22/13 0749 10/22/13 1145  GLUCAP 123* 123* 114*    No results found for this or any previous visit (from the past 240 hour(s)).       Studies: Dg Chest 2 View  10/21/2013   CLINICAL DATA:  68 year old female with acute chest pain and chest tightness. Hypertension and tachycardia. Initial encounter.  EXAM: CHEST  2 VIEW  COMPARISON:  10/15/2013.  FINDINGS: Stable lung volumes, within normal limits. Normal cardiac size and mediastinal contours. Visualized tracheal air column is within normal limits. No pneumothorax, pulmonary edema, pleural effusion or confluent pulmonary opacity. Mildly increased interstitial markings are stable. No acute osseous abnormality identified.  IMPRESSION: No acute cardiopulmonary abnormality.   Electronically Signed   By: Lars Pinks M.D.   On: 10/21/2013 23:36        Scheduled Meds: . amitriptyline  25 mg Oral QHS  . atorvastatin  20 mg Oral q1800  . fluticasone  2 spray Each Nare Daily  . heparin  5,000 Units Subcutaneous 3 times per day  . levothyroxine  100 mcg Oral QAC breakfast  . loratadine  10 mg Oral Daily  . multivitamin with minerals  1 tablet Oral Daily  . pantoprazole  40 mg Oral BID  . sodium chloride  3 mL Intravenous Q12H  . sodium chloride  3 mL Intravenous Q12H  . sodium chloride  3 mL Intravenous Q12H   Continuous Infusions: . sodium chloride    . sodium chloride 75 mL/hr at 10/22/13 1133    Principal Problem:   Chest pressure Active Problems:   Raynaud's syndrome   GERD   Hypothyroidism   DM (diabetes mellitus)   HLD (hyperlipidemia)   History of tachycardia   RBBB (right bundle branch block)   Drug effect    Time spent: 25 minutes    HONGALGI,ANAND, MD, FACP, FHM. [facility] Pager [phone]  If 7PM-7AM, please contact night-coverage [email or web address] Password [email or web address] 10/22/2013, 2:07 PM    LOS: 1 day

## 2013-10-22 NOTE — ED Notes (Signed)
Patient states her chest pressure is getting worse.  MD aware

## 2013-10-23 DIAGNOSIS — E785 Hyperlipidemia, unspecified: Secondary | ICD-10-CM | POA: Diagnosis not present

## 2013-10-23 DIAGNOSIS — K219 Gastro-esophageal reflux disease without esophagitis: Secondary | ICD-10-CM | POA: Diagnosis not present

## 2013-10-23 DIAGNOSIS — Z87898 Personal history of other specified conditions: Secondary | ICD-10-CM | POA: Diagnosis not present

## 2013-10-23 DIAGNOSIS — R0789 Other chest pain: Principal | ICD-10-CM

## 2013-10-23 LAB — GLUCOSE, CAPILLARY
Glucose-Capillary: 113 mg/dL — ABNORMAL HIGH (ref 70–99)
Glucose-Capillary: 124 mg/dL — ABNORMAL HIGH (ref 70–99)

## 2013-10-23 MED ORDER — OMEPRAZOLE 20 MG PO CPDR: 20.0000 mg | DELAYED_RELEASE_CAPSULE | Freq: Two times a day (BID) | ORAL | Status: DC

## 2013-10-23 NOTE — Progress Notes (Signed)
UR completed 

## 2013-10-23 NOTE — Progress Notes (Signed)
Patient Name: Jill Dean Date of Encounter: 10/23/2013  Primary cardiologist: Dr. Marlou Porch   Principal Problem:   Chest pressure Active Problems:   Raynaud's syndrome   GERD   Hypothyroidism   Chest pain   DM (diabetes mellitus)   HLD (hyperlipidemia)   History of tachycardia   RBBB (right bundle branch block)   Drug effect    SUBJECTIVE  Feeling good. Denies any CP or SOB overnight.  CURRENT MEDS . amitriptyline  25 mg Oral QHS  . atorvastatin  20 mg Oral q1800  . fluticasone  2 spray Each Nare Daily  . heparin  5,000 Units Subcutaneous 3 times per day  . levothyroxine  100 mcg Oral QAC breakfast  . loratadine  10 mg Oral Daily  . multivitamin with minerals  1 tablet Oral Daily  . pantoprazole  40 mg Oral BID  . sodium chloride  3 mL Intravenous Q12H  . sodium chloride  3 mL Intravenous Q12H    OBJECTIVE  Filed Vitals:   10/22/13 1800 10/22/13 1830 10/22/13 1959 10/23/13 0543  BP: 123/65 119/62 115/57 116/66  Pulse:  72 72 80  Temp:   98.4 F (36.9 C) 97.4 F (36.3 C)  TempSrc:   Oral Oral  Resp:   18 18  Height:      Weight:    209 lb 11.2 oz (95.119 kg)  SpO2:   94% 94%    Intake/Output Summary (Last 24 hours) at 10/23/13 0809 Last data filed at 10/23/13 0544  Gross per 24 hour  Intake  582.5 ml  Output    500 ml  Net   82.5 ml   Filed Weights   10/22/13 0252 10/23/13 0543  Weight: 209 lb 9.6 oz (95.074 kg) 209 lb 11.2 oz (95.119 kg)    PHYSICAL EXAM  General: Pleasant, NAD. Neuro: Alert and oriented X 3. Moves all extremities spontaneously. Psych: Normal affect. HEENT:  Normal  Neck: Supple without bruits or JVD. Lungs:  Resp regular and unlabored, CTA. Heart: RRR no s3, s4, or murmurs. R radial cath site stable, no bleeding or hematoma Abdomen: Soft, non-tender, non-distended, BS + x 4.  Extremities: No clubbing, cyanosis or edema. DP/PT/Radials 2+ and equal bilaterally.  Accessory Clinical Findings  CBC  Recent Labs   10/21/13 2310  WBC 9.2  HGB 12.6  HCT 37.9  MCV 85.4  PLT 749   Basic Metabolic Panel  Recent Labs  10/21/13 2310 10/22/13 0854  NA 139 140  K 4.2 4.2  CL 100 101  CO2 28 25  GLUCOSE 123* 135*  BUN 15 17  CREATININE 0.78 0.68  CALCIUM 9.1 9.3   Cardiac Enzymes  Recent Labs  10/22/13 0409 10/22/13 0854 10/22/13 1449  TROPONINI <0.30 <0.30 <0.30   D-Dimer  Recent Labs  10/22/13 0111  DDIMER 0.48    TELE NSR with HR 80-low 100s    ECG  NSR with RBBB, TWI in anterior leads  Echocardiogram 10/22/2013  LV EF: 60%  ------------------------------------------------------------------- Indications: Chest pain 786.51.  ------------------------------------------------------------------- History: Risk factors: Diabetes mellitus. Obese. Dyslipidemia.  ------------------------------------------------------------------- Study Conclusions  - Left ventricle: The cavity size was normal. Wall thickness was increased in a pattern of mild LVH. The estimated ejection fraction was 60%. Wall motion was normal; there were no regional wall motion abnormalities. - Right ventricle: The cavity size was normal. Systolic function was normal.      Radiology/Studies  Dg Chest 2 View  10/21/2013   CLINICAL DATA:  68 year old female with acute chest pain and chest tightness. Hypertension and tachycardia. Initial encounter.  EXAM: CHEST  2 VIEW  COMPARISON:  10/15/2013.  FINDINGS: Stable lung volumes, within normal limits. Normal cardiac size and mediastinal contours. Visualized tracheal air column is within normal limits. No pneumothorax, pulmonary edema, pleural effusion or confluent pulmonary opacity. Mildly increased interstitial markings are stable. No acute osseous abnormality identified.  IMPRESSION: No acute cardiopulmonary abnormality.   Electronically Signed   By: Lars Pinks M.D.   On: 10/21/2013 23:36   Dg Chest 2 View  10/15/2013   CLINICAL DATA:  68 year old  female with history of palpitations.  EXAM: CHEST - 2 VIEW  COMPARISON:  None.  FINDINGS: Cardiomediastinal silhouette projects within normal limits in size and contour.  No evidence of pulmonary vascular congestion.  Vague opacity at the right base, in the setting of low lung volumes. No visualized pneumothorax or pleural effusion.  No displaced fracture.  Surgical clips of the right upper quadrant compatible with prior cholecystectomy.  IMPRESSION: Ill-defined opacity at the right base may reflect low lung volumes and atelectasis, however, it would be difficult to exclude a developing infection.  Signed,  Dulcy Fanny. Earleen Newport, DO  [facility]  [facility]   Electronically Signed   By: Corrie Mckusick D.O.   On: 10/15/2013 21:18   Nm Myocar Multi W/spect W/wall Motion / Ef  10/16/2013   CLINICAL DATA:  Chest pain  EXAM: Lexiscan Myovue  TECHNIQUE: The patient received IV Lexiscan .4mg  over 15 seconds. 33.0 mCi of Technetium 41m Sestamibi injected at 30 seconds. Quantitative SPECT images were obtained in the vertical, horizontal and short axis planes after a 45 minute delay. Rest images were obtained with similar planes and delay using 10.2 mCi of Technetium 14m Sestamibi.  FINDINGS: ECG: Baseline ECG shows NSR, RBBB, LAD, nonspecific ST changes. Patient exercised for 4:47 on the Bruce protocol; Maximum HR 148 (97% predicted); Maximum BP 167/50; No chest pain; study terminated due to achieving THR.  Symptoms:  No chest pain or dyspnea.  RAW Data:  Normal homogenous uptake.  Quantitiative Gated SPECT EF: Gated ejection fraction 86%; normal wall motion; TID 1.04; EDV 42 ml; ESV 6 ml.  Perfusion Images: Normal perfusion with no evidence of ischemia or infarction.  IMPRESSION: Normal stress nuclear study with no chest pain, no ST changes and no evidence of ischemia or infarction in any vascular territory; the gated ejection fraction was 86% and the wall motion was normal.   Kirk Ruths   Electronically Signed   By: Kirk Ruths   On: 10/16/2013 14:44    ASSESSMENT AND PLAN  1. Chest pain  - 2 admition in 10 days, nuc obtained during last admission negative for ischemia with preserved EF  - cath 10/22/2013 normal coronaries, seek noncardiac cause  - discussed with patient, no lift >5lbs for 1 week after cath  - likely can discharge today to follow up with Dr. Marlou Porch  2. Raynaud's syndrome  3. GERD  - PPI increased to BID dosing  - CP may related to GERD, expect discharge on BID dosing of PPI  4. Hypothyroidism 5. DM 6. HLD 7. H/o tachycardia  - admitted on Oct 14th, per EMS HR in 200s, no strip, responded to vagal maneuver  - felt poorly with diltiazem which was discontinued, consider a trial of low dose metoprolol 12.5mg  BID, however may also feel poorly  - consider discharge with 30 day event monitor  8. RBBB  Hilbert Corrigan PA-C Pager: [phone]   History and all data above reviewed.  Patient examined.  I agree with the findings as above. No further chest pain.  No palpitations.   The patient exam reveals COR:RRR  ,  Lungs: Clear  ,  Abd: Positive bowel sounds, no rebound no guarding, Ext Right wrist without bruising or oozing  .  All available labs, radiology testing, previous records reviewed. Agree with documented assessment and plan. Chest pain:  Non cardiac.  No further work up.  Tachycardia:  She has an appt with Dr. Marlou Porch.  He can then decide on further testing with a recorder or not.   Jeneen Rinks [facility]  11:38 AM  10/23/2013

## 2013-10-23 NOTE — Progress Notes (Signed)
Pt discharged to home per MD order. Pt received and reviewed all discharge instructions and medication information including follow-up appointments and prescription information. Pt verbalized understanding. Pt alert and oriented at discharge with no complaints of pain. Pt IV and telemetry box removed prior to discharge. Pt escorted to private vehicle via wheelchair by nurse tech. Jill Dean  

## 2013-10-23 NOTE — Discharge Summary (Signed)
Physician Discharge Summary  Jill Dean EGB:[id number] DOB: [birth year] DOA: 10/21/2013  PCP: Horton Finer, MD  Admit date: 10/21/2013 Discharge date: 10/23/2013  Time spent: Less than 30 minutes  Recommendations for Outpatient Follow-up:  1. Dr. Nehemiah Settle, PCP in 1 week. 2. Dr. Candee Furbish, Cardiology on 12/15 at 8:30 AM. 3. Recommend repeating TSH in 4-6 weeks.  Discharge Diagnoses:  Principal Problem:   Chest pressure Active Problems:   Raynaud's syndrome   GERD   Hypothyroidism   Chest pain   DM (diabetes mellitus)   HLD (hyperlipidemia)   History of tachycardia   RBBB (right bundle branch block)   Drug effect   Discharge Condition: Improved & Stable  Diet recommendation: Heart Healthy diet.  Filed Weights   10/22/13 0252 10/23/13 0543  Weight: 95.074 kg (209 lb 9.6 oz) 95.119 kg (209 lb 11.2 oz)    History of present illness:  68 year old female patient with history of DM 2, GERD, HLD, hypothyroid, Raynaud's syndrome, recently hospitalized for SVT discharged by cardiology on 10/15 after negative stress test, returned with midsternal chest pressure at rest that began on 10/20 and feeling poorly on newly started Cardizem. Cardiology consulted and performed cardiac cath 10/21.  Hospital Course:   1. Chest pressure: DD- R/o CAD, GERD/esophageal spasm, muscular. Symptoms not typical to her GERD symptoms. No relief with sublingual nitroglycerin or Maalox. Does not appear muscular or pleuritic in nature. Ruled out for MI and d-dimer upper limit of normal. A recent stress test negative. Cardiology was consulted and patient underwent cath on 10/22/13 which showed normal coronaries indicating that patients chest pain was from noncardiac cause. Chest x-ray negative. UDS negative. Patient was offered statins which she refuses. 2. Recent SVT: Patient states that she has not been able to tolerate diltiazem which is making her extremely lethargic/week. Cardiology  discontinued diltiazem and patient remained in sinus rhythm while hospitalized. Discussed with Dr. Percival Spanish who advised DC without any rate control medications and OP FU with Dr. Marlou Porch to decide regarding further testing i.e. heart monitor. 3. GERD: PPI increased to twice a day as GERD or esophageal spasm could be the cause for her unexplained chest pain. 4. Hypothyroid: TSH 5.24 and free T4-1.07. Continue current levothyroxine dose and recommend repeating TSH in 4-6 weeks. 5. Type II DM: A1c 7. Diet controlled at home. 6. Hyperlipidemia: Patient refuses statins. 7. Raynaud's syndrome: Stable 8. Old RBBB   Consultations:  Cardiology  Procedures:  Left Heart Cath 10/21: Final Conclusions:  1. Normal coronary arteries.  2. Normal LV systolic function by noninvasive testing with mildly elevated left ventricular end-diastolic pressure.     Discharge Exam:  Complaints:  No further chest pains.  Filed Vitals:   10/22/13 1830 10/22/13 1959 10/23/13 0543 10/23/13 1450  BP: 119/62 115/57 116/66 123/73  Pulse: 72 72 80 97  Temp:  98.4 F (36.9 C) 97.4 F (36.3 C) 98.4 F (36.9 C)  TempSrc:  Oral Oral Oral  Resp:  18 18   Height:      Weight:   95.119 kg (209 lb 11.2 oz)   SpO2:  94% 94% 98%   General exam: Pleasant middle-aged female sitting comfortably at edge of bed.  Respiratory system: Clear. No increased work of breathing. No reproducible chest discomfort.  Cardiovascular system: S1 & S2 heard, RRR. No JVD, murmurs, gallops, clicks or pedal edema. Telemetry: Sinus rhythm  Gastrointestinal system: Abdomen is nondistended, soft and nontender. Normal bowel sounds heard.  Central nervous system: Alert and  oriented. No focal neurological deficits.  Extremities: Symmetric 5 x 5 power.   Discharge Instructions      Discharge Instructions   Call MD for:  severe uncontrolled pain    Complete by:  As directed      Diet - low sodium heart healthy    Complete by:  As directed       Increase activity slowly    Complete by:  As directed             Medication List    STOP taking these medications       diltiazem 120 MG 24 hr capsule  Commonly known as:  CARDIZEM CD      TAKE these medications       amitriptyline 25 MG tablet  Commonly known as:  ELAVIL  Take 25 mg by mouth at bedtime.     Biotin 10 MG Caps  Take 1 capsule by mouth daily.     cetirizine 10 MG tablet  Commonly known as:  ZYRTEC  Take 10 mg by mouth daily.     fluticasone 50 MCG/ACT nasal spray  Commonly known as:  FLONASE  Place 2 sprays into both nostrils daily.     levothyroxine 100 MCG tablet  Commonly known as:  SYNTHROID, LEVOTHROID  Take 100 mcg by mouth daily.     Lutein 20 MG Caps  Take 1 capsule by mouth daily.     multivitamin capsule  Take 1 capsule by mouth daily.     omeprazole 20 MG capsule  Commonly known as:  PRILOSEC  Take 1 capsule (20 mg total) by mouth 2 (two) times daily before a meal.       Follow-up Information   Follow up with Candee Furbish, MD On 12/16/2013. (8:30am)    Specialty:  Cardiology   Contact information:   [address] [place] [address] [phone]       Follow up with Horton Finer, MD. Schedule an appointment as soon as possible for a visit in 1 week.   Specialty:  Internal Medicine   Contact information:   [address], [place] [address] [phone]        The results of significant diagnostics from this hospitalization (including imaging, microbiology, ancillary and laboratory) are listed below for reference.    Significant Diagnostic Studies: Dg Chest 2 View  10/21/2013   CLINICAL DATA:  68 year old female with acute chest pain and chest tightness. Hypertension and tachycardia. Initial encounter.  EXAM: CHEST  2 VIEW  COMPARISON:  10/15/2013.  FINDINGS: Stable lung volumes, within normal limits. Normal cardiac size and mediastinal contours. Visualized tracheal air  column is within normal limits. No pneumothorax, pulmonary edema, pleural effusion or confluent pulmonary opacity. Mildly increased interstitial markings are stable. No acute osseous abnormality identified.  IMPRESSION: No acute cardiopulmonary abnormality.   Electronically Signed   By: Lars Pinks M.D.   On: 10/21/2013 23:36   Dg Chest 2 View  10/15/2013   CLINICAL DATA:  68 year old female with history of palpitations.  EXAM: CHEST - 2 VIEW  COMPARISON:  None.  FINDINGS: Cardiomediastinal silhouette projects within normal limits in size and contour.  No evidence of pulmonary vascular congestion.  Vague opacity at the right base, in the setting of low lung volumes. No visualized pneumothorax or pleural effusion.  No displaced fracture.  Surgical clips of the right upper quadrant compatible with prior cholecystectomy.  IMPRESSION: Ill-defined opacity at the right base  may reflect low lung volumes and atelectasis, however, it would be difficult to exclude a developing infection.  Signed,  Dulcy Fanny. Earleen Newport, DO  [facility]  [facility]   Electronically Signed   By: Corrie Mckusick D.O.   On: 10/15/2013 21:18   Nm Myocar Multi W/spect W/wall Motion / Ef  10/16/2013   CLINICAL DATA:  Chest pain  EXAM: Lexiscan Myovue  TECHNIQUE: The patient received IV Lexiscan .4mg  over 15 seconds. 33.0 mCi of Technetium 3m Sestamibi injected at 30 seconds. Quantitative SPECT images were obtained in the vertical, horizontal and short axis planes after a 45 minute delay. Rest images were obtained with similar planes and delay using 10.2 mCi of Technetium 33m Sestamibi.  FINDINGS: ECG: Baseline ECG shows NSR, RBBB, LAD, nonspecific ST changes. Patient exercised for 4:47 on the Bruce protocol; Maximum HR 148 (97% predicted); Maximum BP 167/50; No chest pain; study terminated due to achieving THR.  Symptoms:  No chest pain or dyspnea.  RAW Data:  Normal homogenous uptake.  Quantitiative  Gated SPECT EF: Gated ejection fraction 86%; normal wall motion; TID 1.04; EDV 42 ml; ESV 6 ml.  Perfusion Images: Normal perfusion with no evidence of ischemia or infarction.  IMPRESSION: Normal stress nuclear study with no chest pain, no ST changes and no evidence of ischemia or infarction in any vascular territory; the gated ejection fraction was 86% and the wall motion was normal.  Kirk Ruths   Electronically Signed   By: Kirk Ruths   On: 10/16/2013 14:44    Microbiology: No results found for this or any previous visit (from the past 240 hour(s)).   Labs: Basic Metabolic Panel:  Recent Labs Lab 10/21/13 2310 10/22/13 0854  NA 139 140  K 4.2 4.2  CL 100 101  CO2 28 25  GLUCOSE 123* 135*  BUN 15 17  CREATININE 0.78 0.68  CALCIUM 9.1 9.3   Liver Function Tests: No results found for this basename: AST, ALT, ALKPHOS, BILITOT, PROT, ALBUMIN,  in the last 168 hours No results found for this basename: LIPASE, AMYLASE,  in the last 168 hours No results found for this basename: AMMONIA,  in the last 168 hours CBC:  Recent Labs Lab 10/21/13 2310  WBC 9.2  HGB 12.6  HCT 37.9  MCV 85.4  PLT 271   Cardiac Enzymes:  Recent Labs Lab 10/22/13 0409 10/22/13 0854 10/22/13 1449  TROPONINI <0.30 <0.30 <0.30   BNP: BNP (last 3 results) No results found for this basename: PROBNP,  in the last 8760 hours CBG:  Recent Labs Lab 10/22/13 1145 10/22/13 1636 10/22/13 1957 10/23/13 0747 10/23/13 1126  GLUCAP 114* 82 147* 113* 124*    Additional labs: 1. UDS: Negative 2. D-dimer: 0.48    Signed:  Vernell Leep, MD, FACP, FHM. [facility] Pager [phone]  If 7PM-7AM, please contact night-coverage [email or web address] Password [email or web address] 10/23/2013, 3:04 PM

## 2013-10-24 ENCOUNTER — Telehealth: Payer: Self-pay | Admitting: Cardiology

## 2013-10-24 NOTE — Telephone Encounter (Signed)
Spoke with pt who reports having an episode last night that only lasted a "second" where her heart rate sped up.  She is asking if that occurs again if she should take the diltiazem.  This medication was stopped because pt complained that it caused her to not have any energy at all and she felt horrible.  Advised that if rapid heart beat occurs again over the weekend she should call MD on call.  Her f/u appt was moved up to next week with Dr Marlou Porch.  Pt is in agreement and stats understanding.

## 2013-10-24 NOTE — Telephone Encounter (Signed)
New Message     Pt calling wanting to know if she starts having palpatations again should she take her Cartia meds. Please call pt back and advise.

## 2013-10-27 DIAGNOSIS — K219 Gastro-esophageal reflux disease without esophagitis: Secondary | ICD-10-CM | POA: Diagnosis not present

## 2013-10-27 DIAGNOSIS — R0789 Other chest pain: Secondary | ICD-10-CM | POA: Diagnosis not present

## 2013-10-29 ENCOUNTER — Encounter: Payer: Self-pay | Admitting: Cardiology

## 2013-10-29 ENCOUNTER — Ambulatory Visit (INDEPENDENT_AMBULATORY_CARE_PROVIDER_SITE_OTHER): Payer: Medicare Other | Admitting: Cardiology

## 2013-10-29 VITALS — BP 132/82 | HR 101 | Ht 63.0 in | Wt 209.0 lb

## 2013-10-29 DIAGNOSIS — I10 Essential (primary) hypertension: Secondary | ICD-10-CM | POA: Insufficient documentation

## 2013-10-29 DIAGNOSIS — E119 Type 2 diabetes mellitus without complications: Secondary | ICD-10-CM | POA: Insufficient documentation

## 2013-10-29 DIAGNOSIS — I471 Supraventricular tachycardia: Secondary | ICD-10-CM

## 2013-10-29 DIAGNOSIS — E669 Obesity, unspecified: Secondary | ICD-10-CM | POA: Diagnosis not present

## 2013-10-29 NOTE — Patient Instructions (Signed)
Your physician recommends that you continue on your current medications as directed. Please refer to the Current Medication list given to you today. Your physician wants you to follow-up in: 6 months with Dr. Marlou Porch.  You will receive a reminder letter in the mail two months in advance. If you don't receive a letter, please call our office to schedule the follow-up appointment.  Please contact Dr. Marlou Porch if palpitations return. Use diltiazem (take 1 tablet) as needed if palpitations do not stop with vagal maneuver, as instructed by Dr. Marlou Porch.

## 2013-10-29 NOTE — Progress Notes (Signed)
[facility]. [address], [address] [place], [place]  [address] Phone: [phone] Fax:  [phone]  Date:  10/29/2013   ID:  Jill Dean, DOB September 28, 1945, MRN [id number]  PCP:  Valaria Good, MD   History of Present Illness: Jill Dean is a 68 y.o. female with presumed supraventricular tachycardia of 200 bpm per EMS on monitor, no strips that resolved with vagal maneuvers in early October 2015. She was placed on diltiazem but felt poorly on this medication and this was stopped. She came back to the hospital, had cardiac catheterization which was unremarkable. Echocardiogram was reassuring. Treadmill test was reassuring. Does not wish to take statins.  After leaving hospital, she had a brief episode of tachycardia arrhythmia which terminated with Valsalva. No syncope, no chest pain. She feels much better off of medication.   Wt Readings from Last 3 Encounters:  10/29/13 209 lb (94.802 kg)  10/23/13 209 lb 11.2 oz (95.119 kg)  10/23/13 209 lb 11.2 oz (95.119 kg)     Past Medical History  Diagnosis Date  . Osteoarthrosis, unspecified whether generalized or localized, unspecified site   . Raynaud's syndrome   . Urticaria, unspecified   . Other chronic allergic conjunctivitis   . Allergic rhinitis, cause unspecified   . Fibroid     Ruptured Left endometrioma  . Thyroid disease     Hypothyroid  . Endometriosis   . Diabetes mellitus without complication   . DM (diabetes mellitus)     Non-insulin-dependent; diet controlled  . Obesity   . HLD (hyperlipidemia)   . Chest pain     a. s/p normal ett nuclear stress test on 10/16/13    Past Surgical History  Procedure Laterality Date  . Thyroid nodule    . Knot right breast    . Tonsillectomy    . Vesicovaginal fistula closure w/ tah    . Cholecystectomy    . Oophorectomy      LSO 84-RSO 04  . Vaginal hysterectomy  2004    [facility] RSO    Current Outpatient Prescriptions  Medication Sig Dispense Refill  . amitriptyline  (ELAVIL) 25 MG tablet Take 25 mg by mouth at bedtime.        . Biotin 10 MG CAPS Take 1 capsule by mouth daily.      . cetirizine (ZYRTEC) 10 MG tablet Take 10 mg by mouth daily.        . fluticasone (FLONASE) 50 MCG/ACT nasal spray Place 2 sprays into both nostrils daily.      Marland Kitchen levothyroxine (SYNTHROID, LEVOTHROID) 100 MCG tablet Take 100 mcg by mouth daily.        . Lutein 20 MG CAPS Take 1 capsule by mouth daily.        . Multiple Vitamin (MULTIVITAMIN) capsule Take 1 capsule by mouth daily.        Marland Kitchen omeprazole (PRILOSEC) 20 MG capsule Take 1 capsule (20 mg total) by mouth 2 (two) times daily before a meal.  60 capsule  0   No current facility-administered medications for this visit.    Allergies:    Allergies  Allergen Reactions  . Sulfonamide Derivatives Anaphylaxis and Swelling  . Azithromycin     REACTION: itching  . Clarithromycin     REACTION: severe itching  . Codeine Itching  . Dilaudid [Hydromorphone Hcl] Itching  . Hydrocod Polst-Cpm Polst Er Other (See Comments)    "hypes me up."  . Lidocaine     "  makes me shake"  . Morphine And Related Itching  . Penicillins Itching  . Pseudoephedrine     REACTION: tachycardia  . Shrimp [Shellfish Allergy] Itching    Social History:  The patient  reports that she has never smoked. She has never used smokeless tobacco. She reports that she drinks alcohol. She reports that she does not use illicit drugs.   Family History  Problem Relation Age of Onset  . Lung cancer Father   . Heart disease Father   . Heart failure Mother   . Dementia Mother     ROS:  Please see the history of present illness.   Denies any syncope, bleeding, orthopnea, PND  All other systems reviewed and negative.   PHYSICAL EXAM: VS:  BP 132/82  Pulse 101  Ht 5\' 3"  (1.6 m)  Wt 209 lb (94.802 kg)  BMI 37.03 kg/m2 Well nourished, well developed, in no acute distress HEENT: normal, [place]/AT, EOMI Neck: no JVD, normal carotid upstroke, no bruit Cardiac:   normal S1, S2; RRR; no murmur Lungs:  clear to auscultation bilaterally, no wheezing, rhonchi or rales Abd: soft, nontender, no hepatomegaly, no bruits Ext: no edema, 2+ distal pulses Skin: warm and dry GU: deferred Neuro: no focal abnormalities noted, AAO x 3  EKG:  None today     ASSESSMENT AND PLAN:  1. Paroxysmal supraventricular tachycardia-very sensitive to vagal maneuvers. Offered her monitoring however at this time we will watch clinically. If she is unable to terminate tachycardia with vagal maneuver, she has a diltiazem prescription. Take diltiazem at that time. Please call me and let me know and at that time I will send a monitor out to her house, 30 day event monitor. Next step would be electrophysiology study if necessary once we have a definitive diagnosis. 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 2. Obesity-encourage weight loss. 3. Hypertension essential-well controlled. 4. Type 2 diabetes-hemoglobin A1c 7, diet controlled. 5. GERD-PPI. Question part of chest pain. 6. Raynaud's phenomenon-stable.  Signed, Candee Furbish, MD [facility]  10/29/2013 1:48 PM

## 2013-11-03 ENCOUNTER — Encounter: Payer: Self-pay | Admitting: Cardiology

## 2013-11-17 DIAGNOSIS — E119 Type 2 diabetes mellitus without complications: Secondary | ICD-10-CM | POA: Diagnosis not present

## 2013-12-11 ENCOUNTER — Other Ambulatory Visit: Payer: Self-pay | Admitting: Gastroenterology

## 2013-12-11 ENCOUNTER — Encounter (HOSPITAL_COMMUNITY): Payer: Self-pay | Admitting: Cardiovascular Disease

## 2013-12-13 ENCOUNTER — Encounter (HOSPITAL_COMMUNITY): Payer: Self-pay | Admitting: Emergency Medicine

## 2013-12-13 ENCOUNTER — Emergency Department (HOSPITAL_COMMUNITY)
Admission: EM | Admit: 2013-12-13 | Discharge: 2013-12-13 | Disposition: A | Discharge status: Home / Self Care | Payer: Medicare Other | Attending: Emergency Medicine | Admitting: Emergency Medicine

## 2013-12-13 DIAGNOSIS — Z90711 Acquired absence of uterus with remaining cervical stump: Secondary | ICD-10-CM | POA: Diagnosis not present

## 2013-12-13 DIAGNOSIS — R531 Weakness: Secondary | ICD-10-CM | POA: Diagnosis present

## 2013-12-13 DIAGNOSIS — Z880 Allergy status to penicillin: Secondary | ICD-10-CM | POA: Diagnosis not present

## 2013-12-13 DIAGNOSIS — E039 Hypothyroidism, unspecified: Secondary | ICD-10-CM | POA: Diagnosis not present

## 2013-12-13 DIAGNOSIS — Z8739 Personal history of other diseases of the musculoskeletal system and connective tissue: Secondary | ICD-10-CM | POA: Diagnosis not present

## 2013-12-13 DIAGNOSIS — Z872 Personal history of diseases of the skin and subcutaneous tissue: Secondary | ICD-10-CM | POA: Insufficient documentation

## 2013-12-13 DIAGNOSIS — E119 Type 2 diabetes mellitus without complications: Secondary | ICD-10-CM | POA: Diagnosis not present

## 2013-12-13 DIAGNOSIS — I471 Supraventricular tachycardia: Secondary | ICD-10-CM | POA: Insufficient documentation

## 2013-12-13 DIAGNOSIS — R404 Transient alteration of awareness: Secondary | ICD-10-CM | POA: Diagnosis not present

## 2013-12-13 DIAGNOSIS — Z7951 Long term (current) use of inhaled steroids: Secondary | ICD-10-CM | POA: Insufficient documentation

## 2013-12-13 DIAGNOSIS — E669 Obesity, unspecified: Secondary | ICD-10-CM | POA: Diagnosis not present

## 2013-12-13 LAB — CBC
HEMATOCRIT: 40.1 % (ref 36.0–46.0)
HEMOGLOBIN: 13.1 g/dL (ref 12.0–15.0)
MCH: 28.3 pg (ref 26.0–34.0)
MCHC: 32.7 g/dL (ref 30.0–36.0)
MCV: 86.6 fL (ref 78.0–100.0)
Platelets: 250 10*3/uL (ref 150–400)
RBC: 4.63 MIL/uL (ref 3.87–5.11)
RDW: 13 % (ref 11.5–15.5)
WBC: 8.5 10*3/uL (ref 4.0–10.5)

## 2013-12-13 LAB — BASIC METABOLIC PANEL
Anion gap: 15 (ref 5–15)
BUN: 14 mg/dL (ref 6–23)
CALCIUM: 9.7 mg/dL (ref 8.4–10.5)
CO2: 27 meq/L (ref 19–32)
Chloride: 100 mEq/L (ref 96–112)
Creatinine, Ser: 0.63 mg/dL (ref 0.50–1.10)
GFR calc Af Amer: >90 mL/min (ref >90)
GFR calc non Af Amer: >90 mL/min (ref >90)
GLUCOSE: 105 mg/dL — AB (ref 70–99)
Potassium: 4.5 mEq/L (ref 3.7–5.3)
Sodium: 142 mEq/L (ref 137–147)

## 2013-12-13 LAB — I-STAT TROPONIN, ED: Troponin i, poc: 0 ng/mL (ref 0.00–0.08)

## 2013-12-13 NOTE — ED Notes (Signed)
Patient transported to X-ray 

## 2013-12-13 NOTE — Discharge Instructions (Signed)
Supraventricular Tachycardia °Supraventricular tachycardia (SVT) is an abnormal heart rhythm (arrhythmia) that causes the heart to beat very fast (tachycardia). This kind of fast heartbeat originates in the upper chambers of the heart (atria). SVT can cause the heart to beat greater than 100 beats per minute. SVT can have a rapid burst of heartbeats. This can start and stop suddenly without warning and is called nonsustained. SVT can also be sustained, in which the heart beats at a continuous fast rate.  °CAUSES  °There can be different causes of SVT. Some of these include: °· Heart valve problems such as mitral valve prolapse. °· An enlarged heart (hypertrophic cardiomyopathy). °· Congenital heart problems. °· Heart inflammation (pericarditis). °· Hyperthyroidism. °· Low potassium or magnesium levels. °· Caffeine. °· Drug use such as cocaine, methamphetamines, or stimulants. °· Some over-the-counter medicines such as: °¨ Decongestants. °¨ Diet medicines. °¨ Herbal medicines. °SYMPTOMS  °Symptoms of SVT can vary. Symptoms depend on whether the SVT is sustained or nonsustained. You may experience: °· No symptoms (asymptomatic). °· An awareness of your heart beating rapidly (palpitations). °· Shortness of breath. °· Chest pain or pressure. °If your blood pressure drops because of the SVT, you may experience: °· Fainting or near fainting. °· Weakness. °· Dizziness. °DIAGNOSIS  °Different tests can be performed to diagnose SVT, such as: °· An electrocardiogram (EKG). This is a painless test that records the electrical activity of your heart. °· Holter monitor. This is a 24 hour recording of your heart rhythm. You will be given a diary. Write down all symptoms that you have and what you were doing at the time you experienced symptoms. °· Arrhythmia monitor. This is a small device that your wear for several weeks. It records the heart rhythm when you have symptoms. °· Echocardiogram. This is an imaging test to help detect  abnormal heart structure such as congenital abnormalities, heart valve problems, or heart enlargement. °· Stress test. This test can help determine if the SVT is related to exercise. °· Electrophysiology study (EPS). This is a procedure that evaluates your heart's electrical system and can help your caregiver find the cause of your SVT. °TREATMENT  °Treatment of SVT depends on the symptoms, how often it recurs, and whether there are any underlying heart problems.  °· If symptoms are rare and no other cardiac disease is present, no treatment may be needed. °· Blood work may be done to check potassium, magnesium, and thyroid hormone levels to see if they are abnormal. If these levels are abnormal, treatment to correct the problems will occur. °Medicines °Your caregiver may use oral medicines to treat SVT. These medicines are given for long-term control of SVT. Medicines may be used alone or in combination with other treatments. These medicines work to slow nerve impulses in the heart muscle. These medicines can also be used to treat high blood pressure. Some of these medicines may include: °· Calcium channel blockers. °· Beta blockers. °· Digoxin. °Nonsurgical procedures °Nonsurgical techniques may be used if oral medicines do not work. Some examples include: °· Cardioversion. This technique uses either drugs or an electrical shock to restore a normal heart rhythm. °¨ Cardioversion drugs may be given through an intravenous (IV) line to help "reset" the heart rhythm. °¨ In electrical cardioversion, the caregiver shocks your heart to stop its beat for a split second. This helps to reset the heart to a normal rhythm. °· Ablation. This procedure is done under mild sedation. High frequency radio wave energy is used to   destroy the area of heart tissue responsible for the SVT. °HOME CARE INSTRUCTIONS  °· Do not smoke. °· Only take medicines prescribed by your caregiver. Check with your caregiver before using over-the-counter  medicines. °· Check with your caregiver about how much alcohol and caffeine (coffee, tea, colas, or chocolate) you may have. °· It is very important to keep all follow-up referrals and appointments in order to properly manage this problem. °SEEK IMMEDIATE MEDICAL CARE IF: °· You have dizziness. °· You faint or nearly faint. °· You have shortness of breath. °· You have chest pain or pressure. °· You have sudden nausea or vomiting. °· You have profuse sweating. °· You are concerned about how long your symptoms last. °· You are concerned about the frequency of your SVT episodes. °If you have the above symptoms, call your local emergency services (911 in [place] immediately. Do not drive yourself to the hospital. °MAKE SURE YOU:  °· Understand these instructions. °· Will watch your condition. °· Will get help right away if you are not doing well or get worse. °Document Released: 12/19/2004 Document Revised: 03/13/2011 Document Reviewed: 04/02/2008 °ExitCare® Patient Information ©2015 [employer]. This information is not intended to replace advice given to you by your health care provider. Make sure you discuss any questions you have with your health care provider. ° °

## 2013-12-13 NOTE — ED Notes (Signed)
Per ems, pt reports waking at 3am with "palpitations", followed by htn.  Pt now reports no chest pain, but does reports generalized weakness

## 2013-12-13 NOTE — ED Provider Notes (Signed)
CSN: [id number]     Arrival date & time 12/13/13  1605 History   First MD Initiated Contact with Patient 12/13/13 1639     Chief Complaint  Patient presents with  . Weakness    HPI  Jill Dean is a 68 year old female who presents with palpitations which she first noted around 3am this morning. Symptoms normally abate with Valsalva though did not this time and has not been feeling well since. She reports that EMS found her to be hypertensive, HR 100s, CBG 99. Her blood sugars are normally higher she reports though she does not take insulin or any other medications for diabetes. She also takes Synthroid 100 mcg and has not missed a dose. She last saw Dr. Marlou Porch back in October during which her diltiazem was discontinued as she did not tolerate the medication well; prior workup, including cardiac cath, have been otherwise unremarkable. She denies any recent illness, fever, chest pain, stress.   Past Medical History  Diagnosis Date  . Osteoarthrosis, unspecified whether generalized or localized, unspecified site   . Raynaud's syndrome   . Urticaria, unspecified   . Other chronic allergic conjunctivitis   . Allergic rhinitis, cause unspecified   . Fibroid     Ruptured Left endometrioma  . Thyroid disease     Hypothyroid  . Endometriosis   . Diabetes mellitus without complication   . DM (diabetes mellitus)     Non-insulin-dependent; diet controlled  . Obesity   . HLD (hyperlipidemia)   . Chest pain     a. s/p normal ett nuclear stress test on 10/16/13   Past Surgical History  Procedure Laterality Date  . Thyroid nodule    . Knot right breast    . Tonsillectomy    . Vesicovaginal fistula closure w/ tah    . Cholecystectomy    . Oophorectomy      LSO 84-RSO 04  . Vaginal hysterectomy  2004    [facility] RSO  . Left heart catheterization with coronary angiogram N/A 10/22/2013    Procedure: LEFT HEART CATHETERIZATION WITH CORONARY ANGIOGRAM;  Surgeon: [name], MD;  Location: [facility]  CATH LAB;  Service: Cardiovascular;  Laterality: N/A;   Family History  Problem Relation Age of Onset  . Lung cancer Father   . Heart disease Father   . Heart failure Mother   . Dementia Mother    History  Substance Use Topics  . Smoking status: Never Smoker   . Smokeless tobacco: Never Used  . Alcohol Use: Yes     Comment: RARE   OB History    Gravida Para Term Preterm AB TAB SAB Ectopic Multiple Living   1 1 1       1      Review of Systems  Constitutional: Positive for fatigue. Negative for fever.  Cardiovascular: Positive for palpitations. Negative for chest pain.      Allergies  Hydrocod polst-cpm polst er; Pseudoephedrine; Sulfonamide derivatives; Azithromycin; Codeine; Dilaudid; Morphine and related; Penicillins; Shrimp; Clarithromycin; and Lidocaine  Home Medications   Prior to Admission medications   Medication Sig Start Date End Date Taking? Authorizing Provider  amitriptyline (ELAVIL) 25 MG tablet Take 25 mg by mouth at bedtime.     Yes Historical Provider, MD  Biotin 1 MG CAPS Take 1 mg by mouth daily.   Yes Historical Provider, MD  cetirizine (ZYRTEC) 10 MG tablet Take 10 mg by mouth daily.     Yes Historical Provider, MD  fluticasone (FLONASE) 50 MCG/ACT  nasal spray Place 2 sprays into both nostrils daily.   Yes Historical Provider, MD  levothyroxine (SYNTHROID, LEVOTHROID) 100 MCG tablet Take 100 mcg by mouth daily.     Yes Historical Provider, MD  Lutein 20 MG CAPS Take 1 capsule by mouth daily.     Yes Historical Provider, MD  Multiple Vitamin (MULTIVITAMIN) capsule Take 1 capsule by mouth daily.     Yes Historical Provider, MD  omeprazole (PRILOSEC) 20 MG capsule Take 1 capsule (20 mg total) by mouth 2 (two) times daily before a meal. 10/23/13  Yes Modena Jansky, MD   BP 124/63 mmHg  Pulse 98  Temp(Src) 97.9 F (36.6 C) (Oral)  Resp 18  SpO2 96% Physical Exam  ED Course  Procedures (including critical care time) Labs Review Labs Reviewed   BASIC METABOLIC PANEL - Abnormal; Notable for the following:    Glucose, Bld 105 (*)    All other components within normal limits  CBC  CBG MONITORING, ED  I-STAT TROPOININ, ED    Imaging Review No results found.   EKG Interpretation   Date/Time:  Saturday December 13 2013 16:17:13 EST Ventricular Rate:  101 PR Interval:  149 QRS Duration: 128 QT Interval:  381 QTC Calculation: 494 R Axis:   -104 Text Interpretation:  Age not entered, assumed to be  68 years old for  purpose of ECG interpretation Sinus tachycardia RBBB and LAFB Abnormal ekg  Confirmed by BEATON  MD, ROBERT ([id number]) on 12/13/2013 4:56:24 PM      MDM   Final diagnoses:  PSVT (paroxysmal supraventricular tachycardia)    She does have a history of paroxysmal SVT though unsure of a trigger. BP stable though HR trending mid 90s to low 100s. Will check BMET, CBC, & EKG.  711PM: BMET, EKG, CBC otherwise reassuring. Spoke to her about following up with her cardiologist for further workup which may include an event monitor per his last office visit note.     Charlott Rakes, MD 12/13/13 1914  Dot Lanes, MD 12/13/13 [phone]

## 2013-12-15 ENCOUNTER — Telehealth: Payer: Self-pay | Admitting: Cardiology

## 2013-12-15 ENCOUNTER — Encounter: Payer: Medicare Other | Admitting: Cardiology

## 2013-12-15 DIAGNOSIS — R002 Palpitations: Secondary | ICD-10-CM

## 2013-12-15 NOTE — Telephone Encounter (Signed)
New message      Pt went to the ER over the weekend with palpitations.  After she got there, the palpitations stopped.  The ER suggested she call her doctor and get a monitor.  Please call

## 2013-12-15 NOTE — Telephone Encounter (Signed)
Pt recently seen in the ED (12/12) for palps.  Was instructed to call MD about a possible event.  Will forward to Dr Marlou Porch for review and whether he wants to see her in the office first or order event or 24 hr monitor.

## 2013-12-16 ENCOUNTER — Encounter: Payer: Self-pay | Admitting: Cardiology

## 2013-12-16 ENCOUNTER — Encounter: Payer: Medicare Other | Admitting: Cardiology

## 2013-12-16 NOTE — Telephone Encounter (Signed)
Order 30 day event monitor.  Candee Furbish, MD

## 2013-12-16 NOTE — Telephone Encounter (Signed)
Pt is aware and agreeable.  She will be expecting a call to be scheduled.

## 2013-12-17 ENCOUNTER — Encounter (INDEPENDENT_AMBULATORY_CARE_PROVIDER_SITE_OTHER): Payer: Medicare Other

## 2013-12-17 ENCOUNTER — Encounter: Payer: Self-pay | Admitting: *Deleted

## 2013-12-17 DIAGNOSIS — R002 Palpitations: Secondary | ICD-10-CM | POA: Diagnosis not present

## 2013-12-17 NOTE — Progress Notes (Signed)
Patient ID: Jill Dean, female   DOB: January 31, 1945, 68 y.o.   MRN: [id number] [employer] verite 30 day cardiac event monitor applied to patient.  No [employer] cardiac event monitors available.

## 2014-01-05 ENCOUNTER — Encounter (HOSPITAL_COMMUNITY)
Admission: RE | Disposition: A | Discharge status: Home / Self Care | Payer: Self-pay | Source: Ambulatory Visit | Attending: Gastroenterology

## 2014-01-05 ENCOUNTER — Encounter (HOSPITAL_COMMUNITY): Payer: Self-pay

## 2014-01-05 ENCOUNTER — Ambulatory Visit (HOSPITAL_COMMUNITY)
Admission: RE | Admit: 2014-01-05 | Discharge: 2014-01-05 | Disposition: A | Discharge status: Home / Self Care | Payer: Medicare Other | Source: Ambulatory Visit | Attending: Gastroenterology | Admitting: Gastroenterology

## 2014-01-05 DIAGNOSIS — E119 Type 2 diabetes mellitus without complications: Secondary | ICD-10-CM | POA: Diagnosis not present

## 2014-01-05 DIAGNOSIS — E780 Pure hypercholesterolemia: Secondary | ICD-10-CM | POA: Insufficient documentation

## 2014-01-05 DIAGNOSIS — K219 Gastro-esophageal reflux disease without esophagitis: Secondary | ICD-10-CM | POA: Diagnosis not present

## 2014-01-05 DIAGNOSIS — M1990 Unspecified osteoarthritis, unspecified site: Secondary | ICD-10-CM | POA: Insufficient documentation

## 2014-01-05 DIAGNOSIS — R1013 Epigastric pain: Secondary | ICD-10-CM | POA: Diagnosis not present

## 2014-01-05 DIAGNOSIS — K317 Polyp of stomach and duodenum: Secondary | ICD-10-CM | POA: Insufficient documentation

## 2014-01-05 DIAGNOSIS — K224 Dyskinesia of esophagus: Secondary | ICD-10-CM | POA: Diagnosis present

## 2014-01-05 HISTORY — DX: Adverse effect of unspecified anesthetic, initial encounter: T41.45XA

## 2014-01-05 HISTORY — DX: Nausea with vomiting, unspecified: R11.2

## 2014-01-05 HISTORY — DX: Other complications of anesthesia, initial encounter: T88.59XA

## 2014-01-05 HISTORY — PX: ESOPHAGOGASTRODUODENOSCOPY: SHX5428

## 2014-01-05 HISTORY — DX: Other specified postprocedural states: Z98.890

## 2014-01-05 SURGERY — EGD (ESOPHAGOGASTRODUODENOSCOPY)
Anesthesia: Moderate Sedation

## 2014-01-05 MED ORDER — MIDAZOLAM HCL 10 MG/2ML IJ SOLN
INTRAMUSCULAR | Status: AC
  Filled 2014-01-05: qty 4

## 2014-01-05 MED ORDER — LACTATED RINGERS IV SOLN
INTRAVENOUS | Status: DC
  Administered 2014-01-05: 1000 mL via INTRAVENOUS

## 2014-01-05 MED ORDER — MIDAZOLAM HCL 10 MG/2ML IJ SOLN
INTRAMUSCULAR | Status: DC | PRN
  Administered 2014-01-05 (×2): 2.5 mg via INTRAVENOUS

## 2014-01-05 MED ORDER — FENTANYL CITRATE 0.05 MG/ML IJ SOLN
INTRAMUSCULAR | Status: AC
Start: 2014-01-05 — End: 2014-01-05
  Filled 2014-01-05: qty 4

## 2014-01-05 MED ORDER — BUTAMBEN-TETRACAINE-BENZOCAINE 2-2-14 % EX AERO
INHALATION_SPRAY | CUTANEOUS | Status: DC | PRN
  Administered 2014-01-05: 1 via TOPICAL

## 2014-01-05 MED ORDER — DIPHENHYDRAMINE HCL 50 MG/ML IJ SOLN
INTRAMUSCULAR | Status: AC
  Filled 2014-01-05: qty 1

## 2014-01-05 MED ORDER — SODIUM CHLORIDE 0.9 % IV SOLN: INTRAVENOUS | Status: DC

## 2014-01-05 MED ORDER — FENTANYL CITRATE 0.05 MG/ML IJ SOLN
INTRAMUSCULAR | Status: DC | PRN
  Administered 2014-01-05: 50 ug via INTRAVENOUS

## 2014-01-05 NOTE — H&P (Signed)
  Problem: Indigestion on ibuprofen and doxycycline. Normal screening colonoscopy performed on 09/25/2006. Schedule repeat screening colonoscopy in September 2028  History: The patient is a 69 year old female born [birth year] last [birth year]. She underwent a normal screening colonoscopy in September 2008. She rarely takes doxycycline but does take ibuprofen on a regular basis. She also takes omeprazole twice daily.  When the patient developed burning retrosternal chest discomfort associated with chest pressure, she was evaluated in the emergency room and underwent a cardiac evaluation. The patient was diagnosed with esophageal spasm.  Intermittently, the patient feels as though food she swallowed slowly traverses the esophagus but does not stick in the esophagus. Intermittently, she experiences heartburn without dysphagia.  On 12/11/2013, the patient stopped taking ibuprofen and continue taking omeprazole. Off ibuprofen, the patient has developed generalized muscle aches.  She is scheduled to undergo diagnostic esophagogastroduodenoscopy today.  Past medical history: Raynaud's phenomenon. Allergic rhinitis. Sebaceous cyst of the right breast. Osteoarthritis. Gastroesophageal reflux. Benign positional vertigo in 2002. Fractured left wrist. Chondromalacia of the patella. Thyroid nodule removed surgically. Hypercholesterolemia. Concussion in December 2011. Osteopenia. Type 2 diabetes mellitus. Tubal ligation. Cesarean section. Laparoscopic cholecystectomy. Left oophorectomy for endometriosis. Vaginal hysterectomy with removal of the right ovary in 2004.  Medication allergies: Ceftin. Penicillin. Codeine. Sulfa. Tussionex. Iodine.  Exam: The patient is alert and lying comfortably on the endoscopy stretcher. Abdomen is soft and nontender to palpation. Lungs are clear to auscultation. Cardiac exam reveals a regular rhythm.  Plan: Proceed with diagnostic esophagogastroduodenoscopy.

## 2014-01-05 NOTE — Discharge Instructions (Signed)
Esophagogastroduodenoscopy °Care After °Refer to this sheet in the next few weeks. These instructions provide you with information on caring for yourself after your procedure. Your caregiver may also give you more specific instructions. Your treatment has been planned according to current medical practices, but problems sometimes occur. Call your caregiver if you have any problems or questions after your procedure.  °HOME CARE INSTRUCTIONS °· Do not eat or drink anything until the numbing medicine (local anesthetic) has worn off and your gag reflex has returned. You will know that the local anesthetic has worn off when you can swallow comfortably. °· Do not drive for 12 hours after the procedure or as directed by your caregiver. °· Only take medicines as directed by your caregiver. °SEEK MEDICAL CARE IF:  °· You cannot stop coughing. °· You are not urinating at all or less than usual. °SEEK IMMEDIATE MEDICAL CARE IF: °· You have difficulty swallowing. °· You cannot eat or drink. °· You have worsening throat or chest pain. °· You have dizziness, lightheadedness, or you faint. °· You have nausea or vomiting. °· You have chills. °· You have a fever. °· You have severe abdominal pain. °· You have black, tarry, or bloody stools. °Document Released: 12/06/2011 Document Reviewed: 12/06/2011 °ExitCare® Patient Information ©2015 [employer]. This information is not intended to replace advice given to you by your health care provider. Make sure you discuss any questions you have with your health care provider. ° °

## 2014-01-05 NOTE — Op Note (Signed)
Problem: Indigestion  Endoscopist: Earle Gell  Premedication: Versed 5 mg. Fentanyl 50 g.  Procedure: Diagnostic esophagogastroduodenoscopy with removal of a 6 mm gastric body polyp The patient was placed in the left lateral decubitus position. The Pentax gastroscope was passed through the posterior hypopharynx into the proximal esophagus without difficulty. The vocal cords were not visualized.  Esophagoscopy: The proximal, mid, and lower segments of the esophageal mucosa appeared normal. The squamocolumnar junction was regular in appearance and noted at 40 cm from the incisor teeth. There was no endoscopic evidence for the presence of erosive esophagitis, esophageal stricture formation, or Barrett's esophagus.  Gastroscopy: Retroflex view of the gastric cardia and fundus was normal. In the mid gastric body, anterior wall, a 6 mm sessile polyp was removed with the electrocautery snare and submitted for pathological interpretation. Otherwise the gastric body, antrum, and pylorus appeared normal.  Duodenoscopy: The duodenal bulb and descending duodenum appeared normal.  Assessment:  #1. A 6 mm gastric body polyp was removed with the hot snare and submitted for pathological interpretation  #2. Otherwise normal esophagogastroduodenoscopy.

## 2014-01-06 ENCOUNTER — Encounter (HOSPITAL_COMMUNITY): Payer: Self-pay | Admitting: Gastroenterology

## 2014-01-27 ENCOUNTER — Ambulatory Visit (INDEPENDENT_AMBULATORY_CARE_PROVIDER_SITE_OTHER): Payer: Medicare Other | Admitting: Gynecology

## 2014-01-27 ENCOUNTER — Encounter: Payer: Self-pay | Admitting: Gynecology

## 2014-01-27 VITALS — BP 130/84

## 2014-01-27 DIAGNOSIS — R102 Pelvic and perineal pain: Secondary | ICD-10-CM

## 2014-01-27 DIAGNOSIS — N898 Other specified noninflammatory disorders of vagina: Secondary | ICD-10-CM | POA: Diagnosis not present

## 2014-01-27 DIAGNOSIS — N9489 Other specified conditions associated with female genital organs and menstrual cycle: Secondary | ICD-10-CM | POA: Diagnosis not present

## 2014-01-27 LAB — URINALYSIS W MICROSCOPIC + REFLEX CULTURE
Bilirubin Urine: NEGATIVE
GLUCOSE, UA: NEGATIVE mg/dL
Hgb urine dipstick: NEGATIVE
KETONES UR: NEGATIVE mg/dL
Leukocytes, UA: NEGATIVE
NITRITE: NEGATIVE
Protein, ur: NEGATIVE mg/dL
UROBILINOGEN UA: 0.2 mg/dL (ref 0.0–1.0)
pH: 5.5 (ref 5.0–8.0)

## 2014-01-27 LAB — WET PREP FOR TRICH, YEAST, CLUE
CLUE CELLS WET PREP: NONE SEEN
Trich, Wet Prep: NONE SEEN
Yeast Wet Prep HPF POC: NONE SEEN

## 2014-01-27 MED ORDER — FLUCONAZOLE 200 MG PO TABS: 200.0000 mg | ORAL_TABLET | Freq: Every day | ORAL | Status: DC

## 2014-01-27 NOTE — Progress Notes (Signed)
Jill Dean February 20, 1945 [id number]        69 y.o.  G1P1001 presents with several weeks of on and off pelvic pressure and end stream dysuria. Also vaginal discharge with irritation. No frequency or urgency. No low back pain fever chills nausea vomiting. No vaginal odor. History of LAVH BSO for endometriosis.  Past medical history,surgical history, problem list, medications, allergies, family history and social history were all reviewed and documented in the EPIC chart.  Directed ROS with pertinent positives and negatives documented in the history of present illness/assessment and plan.  Exam: Kim assistant General appearance:  Normal Spine straight without CVA tenderness Abdomen obese, soft nontender without masses guarding rebound. Pelvic external BUS vagina with white cakey discharge. Bimanual exam without masses or tenderness  Assessment/Plan:  69 y.o. G1P1001 with above history and exam.  Urinalysis is negative. Wet prep also negative but they noticed oil. Patient notes that she used a vaginal moisturizer OTC product this morning. Will cover for yeast given the classic history with Diflucan 200 mg daily 3 days to cover any periurethral involvement. Follow up if symptoms persist, worsen or recur.     Anastasio Auerbach MD, 3:29 PM 01/27/2014

## 2014-01-27 NOTE — Patient Instructions (Signed)
Take the Diflucan pill daily for 3 days. Follow up if symptoms persist, worsen or recur.

## 2014-01-28 ENCOUNTER — Telehealth: Payer: Self-pay | Admitting: Cardiology

## 2014-01-28 NOTE — Telephone Encounter (Signed)
New message    Patient is returning phone call but is unsure who called her but she knows its about her test results.  Please give patient a call.

## 2014-01-29 NOTE — Telephone Encounter (Signed)
Some PVC's noted, no SVT, Sinus tachycardia only.  Per Dr Marlou Porch looks Excellent - no new orders and no changes.

## 2014-01-29 NOTE — Telephone Encounter (Signed)
Left message to call back for results

## 2014-01-30 NOTE — Telephone Encounter (Signed)
lmtcb

## 2014-01-30 NOTE — Telephone Encounter (Signed)
Follow up ° ° ° ° °Returning a nurses call from yesterday °

## 2014-02-02 NOTE — Telephone Encounter (Signed)
Reviewed results and plan of care with patient who states she has noticed heart rate as high as 120 bpm over the weekend.  States when she sits down at night, she notices that her heart is pounding. Patient states she tried some of the maneuvers she was taught to get herself out of SVT and heart rate came down.  Patient states the only caffeine she consumes daily is coffee in the morning - does not note more problems during that time.  Patient reports she is drinking plenty of water and staying well hydrated.  Report BP today 119/60.  I advised patient that if she experiences symptoms that are concerning or has worsening palpitations to call back.  Patient verbalized understanding and agreement.

## 2014-02-03 NOTE — Telephone Encounter (Signed)
She did not like diltiazem. Let's try metoprolol XL 25mg  PO QD. 30 day event monitor showed only PAC's, PVC and sinus tachycardia.   Candee Furbish, MD

## 2014-02-05 MED ORDER — METOPROLOL SUCCINATE ER 25 MG PO TB24: 25.0000 mg | ORAL_TABLET | Freq: Every day | ORAL | Status: DC

## 2014-02-05 NOTE — Telephone Encounter (Signed)
Pt aware and will try metoprolol 25 mg a day.  RX will be sent into [facility] as requested.

## 2014-02-05 NOTE — Addendum Note (Signed)
Addended by: Shellia Cleverly on: 02/05/2014 10:34 AM   Modules accepted: Orders

## 2014-03-04 ENCOUNTER — Encounter: Payer: BLUE CROSS/BLUE SHIELD | Admitting: Women's Health

## 2014-04-08 DIAGNOSIS — I471 Supraventricular tachycardia: Secondary | ICD-10-CM | POA: Diagnosis not present

## 2014-04-08 DIAGNOSIS — E119 Type 2 diabetes mellitus without complications: Secondary | ICD-10-CM | POA: Diagnosis not present

## 2014-04-08 DIAGNOSIS — E559 Vitamin D deficiency, unspecified: Secondary | ICD-10-CM | POA: Diagnosis not present

## 2014-04-08 DIAGNOSIS — R52 Pain, unspecified: Secondary | ICD-10-CM | POA: Diagnosis not present

## 2014-04-08 DIAGNOSIS — M25562 Pain in left knee: Secondary | ICD-10-CM | POA: Diagnosis not present

## 2014-04-28 ENCOUNTER — Encounter: Payer: Self-pay | Admitting: Cardiology

## 2014-04-28 ENCOUNTER — Ambulatory Visit (INDEPENDENT_AMBULATORY_CARE_PROVIDER_SITE_OTHER): Payer: Medicare Other | Admitting: Cardiology

## 2014-04-28 VITALS — BP 120/70 | HR 94 | Ht 63.5 in | Wt 221.0 lb

## 2014-04-28 DIAGNOSIS — I471 Supraventricular tachycardia: Secondary | ICD-10-CM

## 2014-04-28 NOTE — Patient Instructions (Signed)
Medication Instructions:  Your physician recommends that you continue on your current medications as directed. Please refer to the Current Medication list given to you today.  Labwork: NONE  Testing/Procedures: NONE  Follow-Up: Follow up in 6 months with Dr. Marlou Porch.  You will receive a letter in the mail 2 months before you are due.  Please call us when you receive this letter to schedule your follow up appointment.  Thank you for choosing [facility]!!

## 2014-04-28 NOTE — Progress Notes (Signed)
[name]. [address], [address] [place], [place]  [address] Phone: [phone] Fax:  [phone]  Date:  04/28/2014   ID:  Jill Dean, DOB Feb 21, 1945, MRN [id number]  PCP:  [name], MD   History of Present Illness: Jill Dean is a 69 y.o. female with presumed supraventricular tachycardia of 200 bpm per EMS on monitor, no strips that resolved with vagal maneuvers in early October 2015. She was placed on diltiazem but felt poorly on this medication and this was stopped. Subsequently, monitoring demonstrated in December 2015 PVC/PACs and sinus tachycardia only. No adverse arrhythmias. We started Toprol-XL 25 mg once a day.  Had cardiac catheterization which was unremarkable. Echocardiogram was reassuring. Treadmill test was reassuring. Does not wish to take statins.  After leaving hospital, she had a brief episode of tachycardia arrhythmia which terminated with Valsalva. No syncope, no chest pain.  Vit D was low, diffuse pain.   Has not had any racing. +Hot flash.   Wt Readings from Last 3 Encounters:  04/28/14 221 lb (100.245 kg)  01/05/14 213 lb (96.616 kg)  10/29/13 209 lb (94.802 kg)     Past Medical History  Diagnosis Date  . Osteoarthrosis, unspecified whether generalized or localized, unspecified site   . Raynaud's syndrome   . Urticaria, unspecified   . Other chronic allergic conjunctivitis   . Allergic rhinitis, cause unspecified   . Thyroid disease     Hypothyroid  . Diabetes mellitus without complication   . DM (diabetes mellitus)     Non-insulin-dependent; diet controlled  . Obesity   . HLD (hyperlipidemia)   . Chest pain     a. s/p normal ett nuclear stress test on 10/16/13  . Complication of anesthesia   . PONV (postoperative nausea and vomiting)     Past Surgical History  Procedure Laterality Date  . Thyroid nodule    . Knot right breast    . Tonsillectomy    . Vesicovaginal fistula closure w/ tah    . Cholecystectomy    . Oophorectomy        LSO 84-RSO 04  . Left heart catheterization with coronary angiogram N/A 10/22/2013    Procedure: LEFT HEART CATHETERIZATION WITH CORONARY ANGIOGRAM;  Surgeon: [name], MD;  Location: [facility];  Service: Cardiovascular;  Laterality: N/A;  . Esophagogastroduodenoscopy N/A 01/05/2014    Procedure: ESOPHAGOGASTRODUODENOSCOPY (EGD);  Surgeon: [name], MD;  Location: [facility];  Service: Endoscopy;  Laterality: N/A;  . Vaginal hysterectomy  2004    [facility] RSO endometriosis    Current Outpatient Prescriptions  Medication Sig Dispense Refill  . amitriptyline (ELAVIL) 25 MG tablet Take 25 mg by mouth at bedtime.      . Biotin 1 MG CAPS Take 1 mg by mouth daily.    . cetirizine (ZYRTEC) 10 MG tablet Take 10 mg by mouth daily.      . fluconazole (DIFLUCAN) 200 MG tablet Take 1 tablet (200 mg total) by mouth daily. 3 tablet 0  . fluticasone (FLONASE) 50 MCG/ACT nasal spray Place 2 sprays into both nostrils daily.    Marland Kitchen levothyroxine (SYNTHROID, LEVOTHROID) 100 MCG tablet Take 100 mcg by mouth daily.      . Lutein 20 MG CAPS Take 1 capsule by mouth daily.      . metoprolol succinate (TOPROL-XL) 25 MG 24 hr tablet Take 1 tablet (25 mg total) by mouth daily. 30 tablet 11  . Multiple Vitamin (MULTIVITAMIN) capsule  Take 1 capsule by mouth daily.      Marland Kitchen omeprazole (PRILOSEC) 20 MG capsule Take 1 capsule (20 mg total) by mouth 2 (two) times daily before a meal. 60 capsule 0  . Vitamin D, Ergocalciferol, (DRISDOL) 50000 UNITS CAPS capsule Take 50,000 Units by mouth every 7 (seven) days. 1 tablet by mouth 1 time a week on Tuesdays     No current facility-administered medications for this visit.    Allergies:    Allergies  Allergen Reactions  . Hydrocod Polst-Cpm Polst Er Other (See Comments)    hyperactivity  . Pseudoephedrine Palpitations and Other (See Comments)    REACTION: tachycardia  . Sulfonamide Derivatives Anaphylaxis and Swelling  . Azithromycin Itching  . Codeine  Itching  . Dilaudid [Hydromorphone Hcl] Itching  . Morphine And Related Itching  . Penicillins Itching  . Shrimp [Shellfish Allergy] Itching  . Clarithromycin Itching    REACTION: severe itching  . Lidocaine Other (See Comments)    unknown    Social History:  The patient  reports that she has never smoked. She has never used smokeless tobacco. She reports that she drinks alcohol. She reports that she does not use illicit drugs.   Family History  Problem Relation Age of Onset  . Lung cancer Father   . Heart disease Father   . Heart failure Mother   . Dementia Mother     ROS:  Please see the history of present illness.   Denies any syncope, bleeding, orthopnea, PND  All other systems reviewed and negative.   PHYSICAL EXAM: VS:  BP 120/70 mmHg  Pulse 94  Ht 5' 3.5" (1.613 m)  Wt 221 lb (100.245 kg)  BMI 38.53 kg/m2  SpO2 99% Well nourished, well developed, in no acute distress HEENT: normal, [place]/AT, EOMI Neck: no JVD, normal carotid upstroke, no bruit Cardiac:  normal S1, S2; RRR; no murmur Lungs:  clear to auscultation bilaterally, no wheezing, rhonchi or rales Abd: soft, nontender, no hepatomegaly, no bruits Ext: no edema, 2+ distal pulses Skin: warm and dry GU: deferred Neuro: no focal abnormalities noted, AAO x 3  EKG:  None today   Event monitor: 12/2013-PACs/PVCs, no adverse arrhythmias. Sinus tachycardia only.  ASSESSMENT AND PLAN:  1. Paroxysmal supraventricular tachycardia-very sensitive to vagal maneuvers. We try diltiazem but she did not tolerate this well. I then placed her on Toprol-XL 25 mg once a day. She has done very well on this low-dose medication. She states that after turning in her monitor, 1 week after she had another fast episode. She stated that after approximately 2 weeks her heart rate is now under good control. Next step would be electrophysiology study if necessary once we have a definitive diagnosis. Possibly AV nodal reentrant  tachycardia. 2. Obesity-encourage weight loss. She has been struggling with this. 3. Hypertension essential-well controlled. 4. Type 2 diabetes-hemoglobin A1c 7, diet controlled. 5. She is taking vitamin D supplements to hopefully help her with her diffuse aching. 6. GERD-PPI. Question part of chest pain. 7. Raynaud's phenomenon-stable.  Signed, Candee Furbish, MD [facility]  04/28/2014 3:19 PM

## 2014-05-04 ENCOUNTER — Encounter: Payer: Medicare Other | Admitting: Women's Health

## 2014-05-07 ENCOUNTER — Ambulatory Visit (INDEPENDENT_AMBULATORY_CARE_PROVIDER_SITE_OTHER): Payer: Medicare Other | Admitting: Women's Health

## 2014-05-07 ENCOUNTER — Encounter: Payer: Self-pay | Admitting: Women's Health

## 2014-05-07 DIAGNOSIS — B373 Candidiasis of vulva and vagina: Secondary | ICD-10-CM

## 2014-05-07 DIAGNOSIS — R350 Frequency of micturition: Secondary | ICD-10-CM | POA: Diagnosis not present

## 2014-05-07 DIAGNOSIS — B3731 Acute candidiasis of vulva and vagina: Secondary | ICD-10-CM

## 2014-05-07 LAB — WET PREP FOR TRICH, YEAST, CLUE
Clue Cells Wet Prep HPF POC: NONE SEEN
Trich, Wet Prep: NONE SEEN

## 2014-05-07 LAB — URINALYSIS W MICROSCOPIC + REFLEX CULTURE
Bilirubin Urine: NEGATIVE
GLUCOSE, UA: NEGATIVE mg/dL
HGB URINE DIPSTICK: NEGATIVE
Ketones, ur: NEGATIVE mg/dL
Leukocytes, UA: NEGATIVE
Nitrite: NEGATIVE
PH: 5 (ref 5.0–8.0)
Protein, ur: NEGATIVE mg/dL
Urobilinogen, UA: 0.2 mg/dL (ref 0.0–1.0)

## 2014-05-07 MED ORDER — FLUCONAZOLE 200 MG PO TABS: 200.0000 mg | ORAL_TABLET | Freq: Every day | ORAL | Status: DC

## 2014-05-07 NOTE — Progress Notes (Signed)
Patient ID: Jill Dean, female   DOB: [birth year], 69 y.o.   MRN: [id number] Presents with complaint of vaginal irritation, burning and odor, mild urinary frequency. States has not felt well most at this week. Mild low abdominal pain, no fever. Uses over-the-counter Monistat without relief. L[facility] with BSO. Not sexually active/husbands health. Has had increased stress with mother-in-law and mother being in the hospital. Mother in long-term care with dementia. History of recurrent yeast.  Exam: Appears fatigued. UA: Negative. Abdomen soft without rebound or radiation of pain. External genitalia within normal limits, speculum exam mild atrophy, erythematous, wet prep positive for moderate yeast.  Yeast vaginitis.  Plan: Diflucan 200 daily, repeat in 3 days if needed, call if no relief of symptoms. Increase rest.

## 2014-05-07 NOTE — Patient Instructions (Signed)

## 2014-05-20 ENCOUNTER — Telehealth: Payer: Self-pay | Admitting: Cardiology

## 2014-05-20 NOTE — Telephone Encounter (Signed)
New Message   Pt c/o medication issue:  1. Name of Medication:Metoprolol   2. How are you currently taking this medication (dosage and times per day)? 25mg  1 a day   3. Are you having a reaction (difficulty breathing--STAT)? Allergic reaction such as itching   4. What is your medication issue? For her heart.

## 2014-05-20 NOTE — Telephone Encounter (Signed)
Spoke with pt to follow up.  She reports her itching has only gotten "alittle better" and she has taken another Benadryl.  She is aware Dr Marlou Porch says she can hold the Metoprolol and take it as needed.  She is not sure at this point if the Metoprolol was causing the itching or not.  She is going to remain off of it and start removing other medications one at a time to see if she can figure it out.  We discussed niacin in her multivitamin and will start with holding it tomorrow.  She will call back if further issues.

## 2014-05-20 NOTE — Telephone Encounter (Signed)
Spoke with pt who reports she has been having trouble with itching and she wasn't sure which medication was causing it.  This morning she took her Synthroid and was fine.  She waited over an hour and then took Metoprolol.  Within 30 minutes of taking Metoprolol the itching began.  She reports feeling hot all over and the itching seems to get worse.  She denies any SOB or swelling.  She took Benadryl which she thinks is helping but feels as though she my need to take another one.  Again, no SOB, swelling or trouble breathing/swallowing.  She is aware I will notify Dr Marlou Porch and call her back with further recommendations.

## 2014-05-20 NOTE — Telephone Encounter (Signed)
Could try stopping the metoprolol and taking on as-needed basis if tachycardia becomes apparent. If she continues to have significant tachycardia and cannot tolerate either diltiazem or metoprolol she may need EPS and possible ablation.   Candee Furbish, MD

## 2014-06-03 ENCOUNTER — Ambulatory Visit (INDEPENDENT_AMBULATORY_CARE_PROVIDER_SITE_OTHER): Payer: Medicare Other | Admitting: Women's Health

## 2014-06-03 ENCOUNTER — Encounter: Payer: Self-pay | Admitting: Women's Health

## 2014-06-03 VITALS — BP 118/80 | Ht 63.0 in | Wt 220.0 lb

## 2014-06-03 DIAGNOSIS — Z13820 Encounter for screening for osteoporosis: Secondary | ICD-10-CM

## 2014-06-03 DIAGNOSIS — Z01419 Encounter for gynecological examination (general) (routine) without abnormal findings: Secondary | ICD-10-CM | POA: Diagnosis not present

## 2014-06-03 DIAGNOSIS — M8580 Other specified disorders of bone density and structure, unspecified site: Secondary | ICD-10-CM

## 2014-06-03 DIAGNOSIS — N898 Other specified noninflammatory disorders of vagina: Secondary | ICD-10-CM

## 2014-06-03 DIAGNOSIS — R8299 Other abnormal findings in urine: Secondary | ICD-10-CM | POA: Diagnosis not present

## 2014-06-03 LAB — WET PREP FOR TRICH, YEAST, CLUE
CLUE CELLS WET PREP: NONE SEEN
TRICH WET PREP: NONE SEEN

## 2014-06-03 LAB — URINALYSIS W MICROSCOPIC + REFLEX CULTURE
Bilirubin Urine: NEGATIVE
Casts: NONE SEEN
Crystals: NONE SEEN
Glucose, UA: NEGATIVE mg/dL
Hgb urine dipstick: NEGATIVE
Ketones, ur: NEGATIVE mg/dL
NITRITE: NEGATIVE
Protein, ur: NEGATIVE mg/dL
SPECIFIC GRAVITY, URINE: 1.01 (ref 1.005–1.030)
Urobilinogen, UA: 0.2 mg/dL (ref 0.0–1.0)
pH: 7.5 (ref 5.0–8.0)

## 2014-06-03 MED ORDER — TERCONAZOLE 0.4 % VA CREA: 1.0000 | TOPICAL_CREAM | Freq: Every day | VAGINAL | Status: DC

## 2014-06-03 NOTE — Patient Instructions (Signed)
Monilial Vaginitis Vaginitis in a soreness, swelling and redness (inflammation) of the vagina and vulva. Monilial vaginitis is not a sexually transmitted infection. CAUSES  Yeast vaginitis is caused by yeast (candida) that is normally found in your vagina. With a yeast infection, the candida has overgrown in number to a point that upsets the chemical balance. SYMPTOMS   White, thick vaginal discharge.  Swelling, itching, redness and irritation of the vagina and possibly the lips of the vagina (vulva).  Burning or painful urination.  Painful intercourse. DIAGNOSIS  Things that may contribute to monilial vaginitis are:  Postmenopausal and virginal states.  Pregnancy.  Infections.  Being tired, sick or stressed, especially if you had monilial vaginitis in the past.  Diabetes. Good control will help lower the chance.  Birth control pills.  Tight fitting garments.  Using bubble bath, feminine sprays, douches or deodorant tampons.  Taking certain medications that kill germs (antibiotics).  Sporadic recurrence can occur if you become ill. TREATMENT  Your caregiver will give you medication.  There are several kinds of anti monilial vaginal creams and suppositories specific for monilial vaginitis. For recurrent yeast infections, use a suppository or cream in the vagina 2 times a week, or as directed.  Anti-monilial or steroid cream for the itching or irritation of the vulva may also be used. Get your caregiver's permission.  Painting the vagina with methylene blue solution may help if the monilial cream does not work.  Eating yogurt may help prevent monilial vaginitis. HOME CARE INSTRUCTIONS   Finish all medication as prescribed.  Do not have sex until treatment is completed or after your caregiver tells you it is okay.  Take warm sitz baths.  Do not douche.  Do not use tampons, especially scented ones.  Wear cotton underwear.  Avoid tight pants and panty  hose.  Tell your sexual partner that you have a yeast infection. They should go to their caregiver if they have symptoms such as mild rash or itching.  Your sexual partner should be treated as well if your infection is difficult to eliminate.  Practice safer sex. Use condoms.  Some vaginal medications cause latex condoms to fail. Vaginal medications that harm condoms are:  Cleocin cream.  Butoconazole (Femstat).  Terconazole (Terazol) vaginal suppository.  Miconazole (Monistat) (may be purchased over the counter). SEEK MEDICAL CARE IF:   You have a temperature by mouth above 102 F (38.9 C).  The infection is getting worse after 2 days of treatment.  The infection is not getting better after 3 days of treatment.  You develop blisters in or around your vagina.  You develop vaginal bleeding, and it is not your menstrual period.  You have pain when you urinate.  You develop intestinal problems.  You have pain with sexual intercourse. Document Released: 09/28/2004 Document Revised: 03/13/2011 Document Reviewed: 06/12/2008 [facility] Patient Information 2015 [place], [place]. This information is not intended to replace advice given to you by your health care provider. Make sure you discuss any questions you have with your health care provider. Health Recommendations for Postmenopausal Women Respected and ongoing research has looked at the most common causes of death, disability, and poor quality of life in postmenopausal women. The causes include heart disease, diseases of blood vessels, diabetes, depression, cancer, and bone loss (osteoporosis). Many things can be done to help lower the chances of developing these and other common problems. CARDIOVASCULAR DISEASE Heart Disease: A heart attack is a medical emergency. Know the signs and symptoms of a heart  attack. Below are things women can do to reduce their risk for heart disease.   Do not smoke. If you smoke, quit.  Aim for a  healthy weight. Being overweight causes many preventable deaths. Eat a healthy and balanced diet and drink an adequate amount of liquids.  Get moving. Make a commitment to be more physically active. Aim for 30 minutes of activity on most, if not all days of the week.  Eat for heart health. Choose a diet that is low in saturated fat and cholesterol and eliminate trans fat. Include whole grains, vegetables, and fruits. Read and understand the labels on food containers before buying.  Know your numbers. Ask your caregiver to check your blood pressure, cholesterol (total, HDL, LDL, triglycerides) and blood glucose. Work with your caregiver on improving your entire clinical picture.  High blood pressure. Limit or stop your table salt intake (try salt substitute and food seasonings). Avoid salty foods and drinks. Read labels on food containers before buying. Eating well and exercising can help control high blood pressure. STROKE  Stroke is a medical emergency. Stroke may be the result of a blood clot in a blood vessel in the brain or by a brain hemorrhage (bleeding). Know the signs and symptoms of a stroke. To lower the risk of developing a stroke:  Avoid fatty foods.  Quit smoking.  Control your diabetes, blood pressure, and irregular heart rate. THROMBOPHLEBITIS (BLOOD CLOT) OF THE LEG  Becoming overweight and leading a stationary lifestyle may also contribute to developing blood clots. Controlling your diet and exercising will help lower the risk of developing blood clots. CANCER SCREENING  Breast Cancer: Take steps to reduce your risk of breast cancer.  You should practice "breast self-awareness." This means understanding the normal appearance and feel of your breasts and should include breast self-examination. Any changes detected, no matter how small, should be reported to your caregiver.  After age 27, you should have a clinical breast exam (CBE) every year.  Starting at age 16, you  should consider having a mammogram (breast X-ray) every year.  If you have a family history of breast cancer, talk to your caregiver about genetic screening.  If you are at high risk for breast cancer, talk to your caregiver about having an MRI and a mammogram every year.  Intestinal or Stomach Cancer: Tests to consider are a rectal exam, fecal occult blood, sigmoidoscopy, and colonoscopy. Women who are high risk may need to be screened at an earlier age and more often.  Cervical Cancer:  Beginning at age 13, you should have a Pap test every 3 years as long as the past 3 Pap tests have been normal.  If you have had past treatment for cervical cancer or a condition that could lead to cancer, you need Pap tests and screening for cancer for at least 20 years after your treatment.  If you had a hysterectomy for a problem that was not cancer or a condition that could lead to cancer, then you no longer need Pap tests.  If you are between ages 58 and 18, and you have had normal Pap tests going back 10 years, you no longer need Pap tests.  If Pap tests have been discontinued, risk factors (such as a new sexual partner) need to be reassessed to determine if screening should be resumed.  Some medical problems can increase the chance of getting cervical cancer. In these cases, your caregiver may recommend more frequent screening and Pap tests.  Uterine  Cancer: If you have vaginal bleeding after reaching menopause, you should notify your caregiver.  Ovarian Cancer: Other than yearly pelvic exams, there are no reliable tests available to screen for ovarian cancer at this time except for yearly pelvic exams.  Lung Cancer: Yearly chest X-rays can detect lung cancer and should be done on high risk women, such as cigarette smokers and women with chronic lung disease (emphysema).  Skin Cancer: A complete body skin exam should be done at your yearly examination. Avoid overexposure to the sun and ultraviolet  light lamps. Use a strong sun block cream when in the sun. All of these things are important for lowering the risk of skin cancer. MENOPAUSE Menopause Symptoms: Hormone therapy products are effective for treating symptoms associated with menopause:  Moderate to severe hot flashes.  Night sweats.  Mood swings.  Headaches.  Tiredness.  Loss of sex drive.  Insomnia.  Other symptoms. Hormone replacement carries certain risks, especially in older women. Women who use or are thinking about using estrogen or estrogen with progestin treatments should discuss that with their caregiver. Your caregiver will help you understand the benefits and risks. The ideal dose of hormone replacement therapy is not known. The Food and Drug Administration (FDA) has concluded that hormone therapy should be used only at the lowest doses and for the shortest amount of time to reach treatment goals.  OSTEOPOROSIS Protecting Against Bone Loss and Preventing Fracture If you use hormone therapy for prevention of bone loss (osteoporosis), the risks for bone loss must outweigh the risk of the therapy. Ask your caregiver about other medications known to be safe and effective for preventing bone loss and fractures. To guard against bone loss or fractures, the following is recommended:  If you are younger than age 75, take 1000 mg of calcium and at least 600 mg of Vitamin D per day.  If you are older than age 64 but younger than age 29, take 1200 mg of calcium and at least 600 mg of Vitamin D per day.  If you are older than age 38, take 1200 mg of calcium and at least 800 mg of Vitamin D per day. Smoking and excessive alcohol intake increases the risk of osteoporosis. Eat foods rich in calcium and vitamin D and do weight bearing exercises several times a week as your caregiver suggests. DIABETES Diabetes Mellitus: If you have type I or type 2 diabetes, you should keep your blood sugar under control with diet, exercise,  and recommended medication. Avoid starchy and fatty foods, and too many sweets. Being overweight can make diabetes control more difficult. COGNITION AND MEMORY Cognition and Memory: Menopausal hormone therapy is not recommended for the prevention of cognitive disorders such as Alzheimer's disease or memory loss.  DEPRESSION  Depression may occur at any age, but it is common in elderly women. This may be because of physical, medical, social (loneliness), or financial problems and needs. If you are experiencing depression because of medical problems and control of symptoms, talk to your caregiver about this. Physical activity and exercise may help with mood and sleep. Community and volunteer involvement may improve your sense of value and worth. If you have depression and you feel that the problem is getting worse or becoming severe, talk to your caregiver about which treatment options are best for you. ACCIDENTS  Accidents are common and can be serious in elderly woman. Prepare your house to prevent accidents. Eliminate throw rugs, place hand bars in bath, shower, and toilet  areas. Avoid wearing high heeled shoes or walking on wet, snowy, and icy areas. Limit or stop driving if you have vision or hearing problems, or if you feel you are unsteady with your movements and reflexes. HEPATITIS C Hepatitis C is a type of viral infection affecting the liver. It is spread mainly through contact with blood from an infected person. It can be treated, but if left untreated, it can lead to severe liver damage over the years. Many people who are infected do not know that the virus is in their blood. If you are a "baby-boomer", it is recommended that you have one screening test for Hepatitis C. IMMUNIZATIONS  Several immunizations are important to consider having during your senior years, including:   Tetanus, diphtheria, and pertussis booster shot.  Influenza every year before the flu season begins.  Pneumonia  vaccine.  Shingles vaccine.  Others, as indicated based on your specific needs. Talk to your caregiver about these. Document Released: 02/10/2005 Document Revised: 05/05/2013 Document Reviewed: 10/07/2007 [facility] Patient Information 2015 [place], [place]. This information is not intended to replace advice given to you by your health care provider. Make sure you discuss any questions you have with your health care provider. Exercise to Stay Healthy Exercise helps you become and stay healthy. EXERCISE IDEAS AND TIPS Choose exercises that:  You enjoy.  Fit into your day. You do not need to exercise really hard to be healthy. You can do exercises at a slow or medium level and stay healthy. You can:  Stretch before and after working out.  Try yoga, Pilates, or tai chi.  Lift weights.  Walk fast, swim, jog, run, climb stairs, bicycle, dance, or rollerskate.  Take aerobic classes. Exercises that burn about 150 calories:  Running 1  miles in 15 minutes.  Playing volleyball for 45 to 60 minutes.  Washing and waxing a car for 45 to 60 minutes.  Playing touch football for 45 minutes.  Walking 1  miles in 35 minutes.  Pushing a stroller 1  miles in 30 minutes.  Playing basketball for 30 minutes.  Raking leaves for 30 minutes.  Bicycling 5 miles in 30 minutes.  Walking 2 miles in 30 minutes.  Dancing for 30 minutes.  Shoveling snow for 15 minutes.  Swimming laps for 20 minutes.  Walking up stairs for 15 minutes.  Bicycling 4 miles in 15 minutes.  Gardening for 30 to 45 minutes.  Jumping rope for 15 minutes.  Washing windows or floors for 45 to 60 minutes. Document Released: 01/21/2010 Document Revised: 03/13/2011 Document Reviewed: 01/21/2010 [facility] Patient Information 2015 [place], [place]. This information is not intended to replace advice given to you by your health care provider. Make sure you discuss any questions you have with your health care  provider.

## 2014-06-03 NOTE — Progress Notes (Signed)
AI SONNENFELD [birth year] [id number]    History:    Presents for breast and pelvic exam. Complaint of vaginal burning. Hysterectomy/endometriosis/no HRT. History of normal Paps and mammograms. Reports normal mammograms at primary care no record in chart. 10/2013 had a heart catheterization that was normal. 12/2011 DEXA T score -1.4 left femoral neck FRAX 14%/1.7%. Type 2 diabetes diet only.  Past medical history, past surgical history, family history and social history were all reviewed and documented in the EPIC chart. Hairdresser, part time. Helping to care for aging parents.  ROS:  A ROS was performed and pertinent positives and negatives are included.  Exam:  Filed Vitals:   06/03/14 1017  BP: 118/80    General appearance:  Normal Thyroid:  Symmetrical, normal in size, without palpable masses or nodularity. Respiratory  Auscultation:  Clear without wheezing or rhonchi Cardiovascular  Auscultation:  Regular rate, without rubs, murmurs or gallops  Edema/varicosities:  Not grossly evident Abdominal  Soft,nontender, without masses, guarding or rebound.  Liver/spleen:  No organomegaly noted  Hernia:  None appreciated  Skin  Inspection:  Grossly normal   Breasts: Examined lying and sitting.     Right: Without masses, retractions, discharge or axillary adenopathy.     Left: Without masses, retractions, discharge or axillary adenopathy. Gentitourinary   Inguinal/mons:  Normal without inguinal adenopathy  External genitalia:  Normal  BUS/Urethra/Skene's glands:  Normal  Vagina:  Normal  Cervix:  And uterus absent  Adnexa/parametria:     Rt: Without masses or tenderness.   Lt: Without masses or tenderness.  Anus and perineum: Normal  Digital rectal exam: Normal sphincter tone without palpated masses or tenderness  Assessment/Plan:  68 y.o. MWF G1P1 for breast and pelvic exam.  Yeast vaginitis TAH/no HRT Osteopenia without elevated FRAX Type 2  diabetes/hypothyroidism/GERD-primary care manages labs and meds  Plan: Repeat DEXA, home safety, fall prevention and importance of regular weightbearing exercise reviewed. Repeat DEXA, will schedule. SBE's, continue annual screening mammogram, calcium rich diet, vitamin D per primary care. Terazol 7 one applicator at bedtime 7, prescription, proper use given and reviewed. Instructed to call if continued problems with vaginal burning. UA. Discussion about increasing exercise, decreasing calories especially carbs for weight loss.    [place], 12:12 PM 06/03/2014

## 2014-06-03 NOTE — Addendum Note (Signed)
Addended by: Burnett Kanaris on: 06/03/2014 02:47 PM   Modules accepted: Orders

## 2014-06-05 LAB — URINE CULTURE

## 2014-06-08 ENCOUNTER — Ambulatory Visit (INDEPENDENT_AMBULATORY_CARE_PROVIDER_SITE_OTHER): Payer: Medicare Other | Admitting: Internal Medicine

## 2014-06-08 VITALS — BP 116/72 | HR 85 | Ht 63.0 in | Wt 220.0 lb

## 2014-06-08 DIAGNOSIS — J302 Other seasonal allergic rhinitis: Secondary | ICD-10-CM

## 2014-06-08 DIAGNOSIS — J309 Allergic rhinitis, unspecified: Secondary | ICD-10-CM

## 2014-06-08 DIAGNOSIS — J3089 Other allergic rhinitis: Principal | ICD-10-CM

## 2014-06-08 DIAGNOSIS — J018 Other acute sinusitis: Secondary | ICD-10-CM

## 2014-06-08 MED ORDER — METHYLPREDNISOLONE ACETATE 80 MG/ML IJ SUSP
80.0000 mg | Freq: Once | INTRAMUSCULAR | Status: AC
  Administered 2014-06-08: 80 mg via INTRAMUSCULAR

## 2014-06-08 MED ORDER — DOXYCYCLINE HYCLATE 100 MG PO TABS: ORAL_TABLET | ORAL | Status: DC

## 2014-06-08 NOTE — Patient Instructions (Signed)
Script sent to [facility] for doxycycline  Neb neo nasal  Depo 80   Hope you feel better and can enjoy your trip

## 2014-06-08 NOTE — Progress Notes (Signed)
Patient ID: Jill Dean, female    DOB: 06/17/1945, 69 y.o.   MRN: [id number]  HPI 07/25/10- 89 yoF never smoker, followed for allergic rhinitis/ conjunctivitis complicated by hx Raynaud's synd, GERD, urticaria Last here January 23,2012  Since we gave neb and depo in January she reports doing well, with no significant problems through the change of seasons. Notes 'sometimes" drip or sniff. Takes zyrtec almost every day. This permits her to go in and out of doors with no problem. Has not had bronchitis.  10/14/10-  21 yoF never smoker, followed for allergic rhinitis/ conjunctivitis complicated by hx Raynaud's synd, GERD, urticaria She had to resume a regular uses ear to make for nasal congestion and drainage. Recent exposure to somebody sick lead to her having a URI/bronchitis syndrome 3 days later. Nasal congestion, chest congestion, sore throat, maybe some fever, blowing yellow-green.  04/17/11- 48 yoF never smoker, followed for allergic rhinitis/ conjunctivitis complicated by hx Raynaud's synd, GERD, urticaria Had a bronchitis last October which resolved. After that winter was good until current pollen season. Over the weekend developed right maxillary sinus pressure and pain.Using Neti pot. Can't tolerate decongestant stimulants because of blood pressure.  05/09/11-  66 yoF never smoker, followed for allergic rhinitis/ conjunctivitis complicated by hx Raynaud's synd, GERD, urticaria Pt c/o prod cough with large amounts of yellow sputum, sore throat, HA, chest tightness, chills and sweats> onset 1 day ago . Spent weekend at the beach if exposed wind and chills. Came home sick. Now starting doxycycline.  06/03/12-67 yoF never smoker, followed for allergic rhinitis/ conjunctivitis complicated by hx Raynaud's synd, GERD, urticaria, DM FOLLOWS FOR: breathing has been doing well. reports lots of congestion, runny nose and sneezing. denies SOB or chest tightness. Using Afrin nasal spray recently to help  sleep area and we discussed this carefully. 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 Increased nasal congestion, sneezing and blowing. Can't tolerate decongestant pills. Newly diagnosed with diabetes, diet controlled.  07/08/12-67 yoF never smoker, followed for allergic rhinitis/ conjunctivitis complicated by hx Raynaud's synd, GERD, urticaria, DM ACUTE VISIT: pt c/o congested head, pnd,f/c/s, cough w green nasal drainage, denies any SOB  x 3 days started doxy 2 days ago w no improvement  Frontal HA, no fever. Exposed to sick daughter.  09/09/12- 67 yoF never smoker, followed for allergic rhinitis/ conjunctivitis complicated by hx Raynaud's synd, GERD, urticaria, DM ACUTE VISIT: 3 nights ago was out in "night air". Next day started having a "raw" throat. Since then has chest congestion/tightness; cough, and SOB.low-grade fever yesterday. Cough, productive yellow green.  06/04/13- 67 yoF never smoker, followed for allergic rhinitis/ conjunctivitis complicated by hx Raynaud's synd, GERD, urticaria, DM FOLLOWS FOR: has been doing well overall; denies any wheezing, cough, congestion, or SOB Daily saline nasal spray big help. Asks antibiotic to carry for beach trip. Zyrtec, Flonase prn.  06/08/14- 67 yoF never smoker, followed for allergic rhinitis/ conjunctivitis complicated by hx Raynaud's synd, GERD, urticaria, DM Reports: pt. having yellowish mucus out the nose. left ear pain 'pressure' feeling. For the last several days as had nasal congestion, uncomfortable pressure in her ears, some yellow discharge from nose and chest. No fever, sore throat, adenopathy or GI upset.  Review of Systems-See HPI Constitutional:   No-   weight loss, night sweats, fevers, chills, fatigue, lassitude. HEENT:   + headaches, difficulty swallowing, tooth/dental problems, +sore throat,       sneezing, itching, ear ache, +nasal congestion, +post nasal drip,  CV:  No-   chest pain, orthopnea, PND, swelling in lower extremities,  anasarca,  dizziness, palpitations Resp: No-   shortness of breath with exertion or at rest.            +-productive cough,  No non-productive cough,  No-  coughing up of blood.              +-change in color of mucus.  No- wheezing.    Objective:   Physical Exam General- Alert, Oriented, Affect-appropriate, Distress- none acute  Overweight Skin- rash-none, lesions- none, excoriation- none Lymphadenopathy- none Head- atraumatic            Eyes- Gross vision intact, PERRLA, conjunctivae clear secretions, +periorbital edema            Ears- Hearing, canals-normal            Nose- +mucus, +minor erythema., No-Septal dev,  polyps, erosion, perforation, + raspy voice            Throat- Mallampati III-IV , mucosa clear-not red , drainage- none, tonsils- atrophic.  Neck- flexible , trachea midline, no stridor , thyroid nl, carotid no bruit Chest -            Lung- clear to P&A but diminished, wheeze- none, cough- none , dullness-none, rub- none           Chest wall-  Abd-  Br/ Gen/ Rectal- Not done, not indicated Extrem- cyanosis- none, clubbing, none, atrophy- none, strength- nl Neuro- grossly intact to observation

## 2014-06-09 ENCOUNTER — Encounter: Payer: Self-pay | Admitting: Internal Medicine

## 2014-06-09 MED ORDER — PHENYLEPHRINE HCL 1 % NA SOLN
3.0000 [drp] | Freq: Once | NASAL | Status: AC
  Administered 2014-06-08: 3 [drp] via NASAL

## 2014-06-09 NOTE — Assessment & Plan Note (Signed)
Not clear of seasonal allergy caused the nasal congestion leading to current infection, or it was a viral illness initially Plan-discussed antihistamines and decongestants

## 2014-06-09 NOTE — Assessment & Plan Note (Signed)
Acute frontal and maxillary sinusitis with left eustachian dysfunction Plan-doxycycline, saline nasal rinse

## 2014-07-01 DIAGNOSIS — Z1231 Encounter for screening mammogram for malignant neoplasm of breast: Secondary | ICD-10-CM | POA: Diagnosis not present

## 2014-09-03 DIAGNOSIS — S93411A Sprain of calcaneofibular ligament of right ankle, initial encounter: Secondary | ICD-10-CM | POA: Diagnosis not present

## 2014-10-19 DIAGNOSIS — M1812 Unilateral primary osteoarthritis of first carpometacarpal joint, left hand: Secondary | ICD-10-CM | POA: Diagnosis not present

## 2014-10-19 DIAGNOSIS — D2111 Benign neoplasm of connective and other soft tissue of right upper limb, including shoulder: Secondary | ICD-10-CM | POA: Diagnosis not present

## 2014-10-19 DIAGNOSIS — M151 Heberden's nodes (with arthropathy): Secondary | ICD-10-CM | POA: Diagnosis not present

## 2014-10-19 DIAGNOSIS — M152 Bouchard's nodes (with arthropathy): Secondary | ICD-10-CM | POA: Diagnosis not present

## 2014-10-26 ENCOUNTER — Encounter: Payer: Self-pay | Admitting: Cardiology

## 2014-10-26 ENCOUNTER — Ambulatory Visit (INDEPENDENT_AMBULATORY_CARE_PROVIDER_SITE_OTHER): Payer: Medicare Other | Admitting: Cardiology

## 2014-10-26 VITALS — BP 122/82 | HR 102 | Ht 64.0 in | Wt 226.1 lb

## 2014-10-26 DIAGNOSIS — E119 Type 2 diabetes mellitus without complications: Secondary | ICD-10-CM

## 2014-10-26 DIAGNOSIS — I471 Supraventricular tachycardia: Secondary | ICD-10-CM

## 2014-10-26 DIAGNOSIS — E669 Obesity, unspecified: Secondary | ICD-10-CM | POA: Diagnosis not present

## 2014-10-26 NOTE — Progress Notes (Signed)
[facility]. [address], [address], [place]  [address] Phone: [phone] Fax:  [phone]  Date:  10/26/2014   ID:  Jill Dean, DOB [birth year], MRN [id number]  PCP:  Lottie Dawson, MD   History of Present Illness: Jill Dean is a 69 y.o. female with presumed supraventricular tachycardia of 200 bpm per EMS on monitor, no strips that resolved with vagal maneuvers in early October 2015. She was placed on diltiazem but felt poorly on this medication and this was stopped. Subsequently, monitoring demonstrated in December 2015 PVC/PACs and sinus tachycardia only. No adverse arrhythmias. We started Toprol-XL 25 mg once a day.  Had cardiac catheterization which was unremarkable. Echocardiogram was reassuring. Treadmill test was reassuring. Does not wish to take statins.  After leaving hospital, she had a brief episode of tachycardia arrhythmia which terminated with Valsalva. No syncope, no chest pain.  Vit D was low, diffuse pain.   Has not had any racing. +Hot flash.  Sometimes has sweats. Emotional at times.   Excited about her daughter, she is going to be a grandmother.  Wt Readings from Last 3 Encounters:  10/26/14 226 lb 1.9 oz (102.567 kg)  06/08/14 220 lb (99.791 kg)  06/03/14 220 lb (99.791 kg)     Past Medical History  Diagnosis Date  . Osteoarthrosis, unspecified whether generalized or localized, unspecified site   . Raynaud's syndrome   . Urticaria, unspecified   . Other chronic allergic conjunctivitis   . Obesity   . HLD (hyperlipidemia)   . Chest pain     a. s/p normal ett nuclear stress test on 10/16/13  . Complication of anesthesia   . PONV (postoperative nausea and vomiting)     Past Surgical History  Procedure Laterality Date  . Thyroid nodule    . Knot right breast    . Tonsillectomy    . Vesicovaginal fistula closure w/ tah    . Cholecystectomy    . Oophorectomy      LSO 84-RSO 04  . Left heart catheterization with coronary  angiogram N/A 10/22/2013    Procedure: LEFT HEART CATHETERIZATION WITH CORONARY ANGIOGRAM;  Surgeon: [name], MD;  Location: [facility];  Service: Cardiovascular;  Laterality: N/A;  . Esophagogastroduodenoscopy N/A 01/05/2014    Procedure: ESOPHAGOGASTRODUODENOSCOPY (EGD);  Surgeon: [name], MD;  Location: [facility];  Service: Endoscopy;  Laterality: N/A;  . Vaginal hysterectomy  2004    LAVH RSO endometriosis    Current Outpatient Prescriptions  Medication Sig Dispense Refill  . ACCU-CHEK AVIVA PLUS test strip   2  . amitriptyline (ELAVIL) 25 MG tablet Take 25 mg by mouth at bedtime.      . cetirizine (ZYRTEC) 10 MG tablet Take 10 mg by mouth daily.      . fluticasone (FLONASE) 50 MCG/ACT nasal spray Place 2 sprays into both nostrils daily.    Marland Kitchen levothyroxine (SYNTHROID, LEVOTHROID) 100 MCG tablet Take 100 mcg by mouth daily.      . Lutein 20 MG CAPS Take 1 capsule by mouth daily.      . metoprolol succinate (TOPROL-XL) 25 MG 24 hr tablet Take 1 tablet (25 mg total) by mouth daily. 30 tablet 11  . omeprazole (PRILOSEC) 20 MG capsule Take 1 capsule (20 mg total) by mouth 2 (two) times daily before a meal. 60 capsule 0   No current facility-administered medications for this visit.    Allergies:    Allergies  Allergen Reactions  . Hydrocod Polst-Cpm Polst Er Other (See Comments)    hyperactivity  . Pseudoephedrine Palpitations and Other (See Comments)    REACTION: tachycardia  . Sulfonamide Derivatives Anaphylaxis and Swelling  . Azithromycin Itching  . Codeine Itching  . Dilaudid [Hydromorphone Hcl] Itching  . Morphine And Related Itching  . Penicillins Itching  . Shrimp [Shellfish Allergy] Itching  . Clarithromycin Itching    REACTION: severe itching  . Lidocaine Other (See Comments)    unknown    Social History:  The patient  reports that she has never smoked. She has never used smokeless tobacco. She reports that she drinks alcohol. She reports that she  does not use illicit drugs.   Family History  Problem Relation Age of Onset  . Lung cancer Father   . Heart disease Father   . Heart failure Mother   . Dementia Mother   . Heart disease Maternal Uncle   . Lung cancer Paternal Uncle   . COPD Paternal Uncle     ROS:  Please see the history of present illness.   Denies any syncope, bleeding, orthopnea, PND  All other systems reviewed and negative.   PHYSICAL EXAM: VS:  BP 122/82 mmHg  Pulse 102  Ht 5\' 4"  (1.626 m)  Wt 226 lb 1.9 oz (102.567 kg)  BMI 38.79 kg/m2  SpO2 94% Well nourished, well developed, in no acute distress HEENT: normal, [place]/AT, EOMI Neck: no JVD, normal carotid upstroke, no bruit Cardiac:  normal S1, S2; RRR; no murmur Lungs:  clear to auscultation bilaterally, no wheezing, rhonchi or rales Abd: soft, nontender, no hepatomegaly, no bruits Ext: no edema, 2+ distal pulses overweight Skin: warm and dry GU: deferred Neuro: no focal abnormalities noted, AAO x 3  EKG:  None today   Event monitor: 12/2013-PACs/PVCs, no adverse arrhythmias. Sinus tachycardia only.  ASSESSMENT AND PLAN:  1. Paroxysmal supraventricular tachycardia-very sensitive to vagal maneuvers. We tried diltiazem but she did not tolerate this well. I then placed her on Toprol-XL 25 mg once a day. She has done very well on this low-dose medication.  Very rarely does she feel any evidence of tachycardia. She knows to take an extra metoprolol if necessary.Next step would be electrophysiology study if necessary once we have a definitive diagnosis. 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 2. Obesity-encourage weight loss. She has been struggling with this. Discussed decreasing carbohydrates. Her daughter is close to [redacted] weeks pregnant. 3. Hypertension essential-well controlled. 4. Type 2 diabetes-hemoglobin A1c 7, diet controlled. 5. She is taking vitamin D supplements to hopefully help her with her diffuse aching. 6. GERD-PPI. Question part of chest  pain. 7. Raynaud's phenomenon-stable. 8.  6 month follow-up  Signed, Candee Furbish, MD [facility]  10/26/2014 2:25 PM

## 2014-10-26 NOTE — Patient Instructions (Signed)
Medication Instructions:  The current medical regimen is effective;  continue present plan and medications.  Follow-Up: Follow up in 6 months with Dr. Skains.  You will receive a letter in the mail 2 months before you are due.  Please call us when you receive this letter to schedule your follow up appointment.  Thank you for choosing [facility]!!     

## 2014-10-28 DIAGNOSIS — R2232 Localized swelling, mass and lump, left upper limb: Secondary | ICD-10-CM | POA: Diagnosis not present

## 2014-10-30 DIAGNOSIS — M79642 Pain in left hand: Secondary | ICD-10-CM | POA: Diagnosis not present

## 2014-10-30 DIAGNOSIS — M151 Heberden's nodes (with arthropathy): Secondary | ICD-10-CM | POA: Diagnosis not present

## 2014-10-30 DIAGNOSIS — M79641 Pain in right hand: Secondary | ICD-10-CM | POA: Diagnosis not present

## 2014-10-30 DIAGNOSIS — M1812 Unilateral primary osteoarthritis of first carpometacarpal joint, left hand: Secondary | ICD-10-CM | POA: Diagnosis not present

## 2014-11-02 ENCOUNTER — Other Ambulatory Visit: Payer: Self-pay | Admitting: Orthopedic Surgery

## 2014-11-03 ENCOUNTER — Encounter (HOSPITAL_BASED_OUTPATIENT_CLINIC_OR_DEPARTMENT_OTHER): Payer: Self-pay | Admitting: *Deleted

## 2014-11-04 DIAGNOSIS — E559 Vitamin D deficiency, unspecified: Secondary | ICD-10-CM | POA: Diagnosis not present

## 2014-11-04 DIAGNOSIS — E119 Type 2 diabetes mellitus without complications: Secondary | ICD-10-CM | POA: Diagnosis not present

## 2014-11-04 DIAGNOSIS — Z6841 Body Mass Index (BMI) 40.0 and over, adult: Secondary | ICD-10-CM | POA: Diagnosis not present

## 2014-11-04 DIAGNOSIS — E785 Hyperlipidemia, unspecified: Secondary | ICD-10-CM | POA: Diagnosis not present

## 2014-11-04 DIAGNOSIS — E039 Hypothyroidism, unspecified: Secondary | ICD-10-CM | POA: Diagnosis not present

## 2014-11-04 DIAGNOSIS — M8588 Other specified disorders of bone density and structure, other site: Secondary | ICD-10-CM | POA: Diagnosis not present

## 2014-11-04 DIAGNOSIS — Z1389 Encounter for screening for other disorder: Secondary | ICD-10-CM | POA: Diagnosis not present

## 2014-11-04 DIAGNOSIS — Z0000 Encounter for general adult medical examination without abnormal findings: Secondary | ICD-10-CM | POA: Diagnosis not present

## 2014-11-04 DIAGNOSIS — Z23 Encounter for immunization: Secondary | ICD-10-CM | POA: Diagnosis not present

## 2014-11-04 DIAGNOSIS — I471 Supraventricular tachycardia: Secondary | ICD-10-CM | POA: Diagnosis not present

## 2014-11-10 ENCOUNTER — Ambulatory Visit (HOSPITAL_BASED_OUTPATIENT_CLINIC_OR_DEPARTMENT_OTHER): Payer: Medicare Other | Admitting: Anesthesiology

## 2014-11-10 ENCOUNTER — Ambulatory Visit (HOSPITAL_BASED_OUTPATIENT_CLINIC_OR_DEPARTMENT_OTHER)
Admission: RE | Admit: 2014-11-10 | Discharge: 2014-11-10 | Disposition: A | Discharge status: Home / Self Care | Payer: Medicare Other | Source: Ambulatory Visit | Attending: Orthopedic Surgery | Admitting: Orthopedic Surgery

## 2014-11-10 ENCOUNTER — Encounter (HOSPITAL_BASED_OUTPATIENT_CLINIC_OR_DEPARTMENT_OTHER)
Admission: RE | Disposition: A | Discharge status: Home / Self Care | Payer: Self-pay | Source: Ambulatory Visit | Attending: Orthopedic Surgery

## 2014-11-10 ENCOUNTER — Encounter (HOSPITAL_BASED_OUTPATIENT_CLINIC_OR_DEPARTMENT_OTHER): Payer: Self-pay | Admitting: Orthopedic Surgery

## 2014-11-10 DIAGNOSIS — E785 Hyperlipidemia, unspecified: Secondary | ICD-10-CM | POA: Insufficient documentation

## 2014-11-10 DIAGNOSIS — M19042 Primary osteoarthritis, left hand: Secondary | ICD-10-CM | POA: Diagnosis not present

## 2014-11-10 DIAGNOSIS — Z880 Allergy status to penicillin: Secondary | ICD-10-CM | POA: Insufficient documentation

## 2014-11-10 DIAGNOSIS — Z882 Allergy status to sulfonamides status: Secondary | ICD-10-CM | POA: Insufficient documentation

## 2014-11-10 DIAGNOSIS — M71342 Other bursal cyst, left hand: Secondary | ICD-10-CM | POA: Diagnosis not present

## 2014-11-10 DIAGNOSIS — I7300 Raynaud's syndrome without gangrene: Secondary | ICD-10-CM | POA: Insufficient documentation

## 2014-11-10 DIAGNOSIS — D2112 Benign neoplasm of connective and other soft tissue of left upper limb, including shoulder: Secondary | ICD-10-CM | POA: Diagnosis not present

## 2014-11-10 DIAGNOSIS — Z6839 Body mass index (BMI) 39.0-39.9, adult: Secondary | ICD-10-CM | POA: Diagnosis not present

## 2014-11-10 DIAGNOSIS — I739 Peripheral vascular disease, unspecified: Secondary | ICD-10-CM | POA: Insufficient documentation

## 2014-11-10 DIAGNOSIS — M67844 Other specified disorders of tendon, left hand: Secondary | ICD-10-CM | POA: Diagnosis not present

## 2014-11-10 DIAGNOSIS — Z91013 Allergy to seafood: Secondary | ICD-10-CM | POA: Insufficient documentation

## 2014-11-10 DIAGNOSIS — Z91041 Radiographic dye allergy status: Secondary | ICD-10-CM | POA: Diagnosis not present

## 2014-11-10 DIAGNOSIS — Z79899 Other long term (current) drug therapy: Secondary | ICD-10-CM | POA: Diagnosis not present

## 2014-11-10 DIAGNOSIS — M151 Heberden's nodes (with arthropathy): Secondary | ICD-10-CM | POA: Diagnosis not present

## 2014-11-10 DIAGNOSIS — E039 Hypothyroidism, unspecified: Secondary | ICD-10-CM | POA: Insufficient documentation

## 2014-11-10 DIAGNOSIS — M67442 Ganglion, left hand: Secondary | ICD-10-CM | POA: Insufficient documentation

## 2014-11-10 DIAGNOSIS — K219 Gastro-esophageal reflux disease without esophagitis: Secondary | ICD-10-CM | POA: Insufficient documentation

## 2014-11-10 HISTORY — DX: Gastro-esophageal reflux disease without esophagitis: K21.9

## 2014-11-10 HISTORY — DX: Hypothyroidism, unspecified: E03.9

## 2014-11-10 HISTORY — PX: CYST EXCISION: SHX5701

## 2014-11-10 HISTORY — DX: Cardiac arrhythmia, unspecified: I49.9

## 2014-11-10 SURGERY — CYST REMOVAL
Anesthesia: Monitor Anesthesia Care | Site: Finger | Laterality: Left

## 2014-11-10 MED ORDER — VANCOMYCIN HCL 10 G IV SOLR
1500.0000 mg | INTRAVENOUS | Status: AC
  Administered 2014-11-10: 1000 mg via INTRAVENOUS

## 2014-11-10 MED ORDER — MIDAZOLAM HCL 2 MG/2ML IJ SOLN
INTRAMUSCULAR | Status: AC
  Filled 2014-11-10: qty 4

## 2014-11-10 MED ORDER — FENTANYL CITRATE (PF) 100 MCG/2ML IJ SOLN: 25.0000 ug | INTRAMUSCULAR | Status: DC | PRN

## 2014-11-10 MED ORDER — SCOPOLAMINE 1 MG/3DAYS TD PT72: 1.0000 | MEDICATED_PATCH | Freq: Once | TRANSDERMAL | Status: DC | PRN

## 2014-11-10 MED ORDER — TRAMADOL HCL 50 MG PO TABS: 50.0000 mg | ORAL_TABLET | Freq: Four times a day (QID) | ORAL | Status: DC | PRN

## 2014-11-10 MED ORDER — KETOROLAC TROMETHAMINE 30 MG/ML IJ SOLN: 30.0000 mg | Freq: Once | INTRAMUSCULAR | Status: DC

## 2014-11-10 MED ORDER — CHLORHEXIDINE GLUCONATE 4 % EX LIQD: 60.0000 mL | Freq: Once | CUTANEOUS | Status: DC

## 2014-11-10 MED ORDER — MIDAZOLAM HCL 2 MG/2ML IJ SOLN
1.0000 mg | INTRAMUSCULAR | Status: DC | PRN
  Administered 2014-11-10: 2 mg via INTRAVENOUS

## 2014-11-10 MED ORDER — FENTANYL CITRATE (PF) 100 MCG/2ML IJ SOLN
50.0000 ug | INTRAMUSCULAR | Status: DC | PRN
  Administered 2014-11-10: 100 ug via INTRAVENOUS

## 2014-11-10 MED ORDER — ONDANSETRON HCL 4 MG/2ML IJ SOLN
INTRAMUSCULAR | Status: AC
  Filled 2014-11-10: qty 2

## 2014-11-10 MED ORDER — BUPIVACAINE HCL (PF) 0.25 % IJ SOLN
INTRAMUSCULAR | Status: DC | PRN
  Administered 2014-11-10: 8 mL

## 2014-11-10 MED ORDER — VANCOMYCIN HCL IN DEXTROSE 1-5 GM/200ML-% IV SOLN: 1000.0000 mg | INTRAVENOUS | Status: DC

## 2014-11-10 MED ORDER — FENTANYL CITRATE (PF) 100 MCG/2ML IJ SOLN
INTRAMUSCULAR | Status: AC
  Filled 2014-11-10: qty 4

## 2014-11-10 MED ORDER — VANCOMYCIN HCL IN DEXTROSE 1-5 GM/200ML-% IV SOLN
INTRAVENOUS | Status: AC
  Filled 2014-11-10: qty 200

## 2014-11-10 MED ORDER — LACTATED RINGERS IV SOLN
INTRAVENOUS | Status: DC
  Administered 2014-11-10: 10:00:00 via INTRAVENOUS

## 2014-11-10 MED ORDER — PROPOFOL 10 MG/ML IV BOLUS
INTRAVENOUS | Status: DC | PRN
  Administered 2014-11-10: 30 mg via INTRAVENOUS
  Administered 2014-11-10 (×2): 20 mg via INTRAVENOUS

## 2014-11-10 MED ORDER — PROMETHAZINE HCL 25 MG/ML IJ SOLN: 6.2500 mg | INTRAMUSCULAR | Status: DC | PRN

## 2014-11-10 MED ORDER — LIDOCAINE HCL (PF) 0.5 % IJ SOLN
INTRAMUSCULAR | Status: DC | PRN
  Administered 2014-11-10: 30 mL via INTRAVENOUS

## 2014-11-10 MED ORDER — GLYCOPYRROLATE 0.2 MG/ML IJ SOLN: 0.2000 mg | Freq: Once | INTRAMUSCULAR | Status: DC | PRN

## 2014-11-10 SURGICAL SUPPLY — 50 items
BANDAGE COBAN STERILE 2 (GAUZE/BANDAGES/DRESSINGS) IMPLANT
BLADE MINI RND TIP GREEN BEAV (BLADE) IMPLANT
BLADE SURG 15 STRL LF DISP TIS (BLADE) ×1 IMPLANT
BLADE SURG 15 STRL SS (BLADE) ×3
BNDG CMPR 9X4 STRL LF SNTH (GAUZE/BANDAGES/DRESSINGS) ×1
BNDG COHESIVE 1X5 TAN STRL LF (GAUZE/BANDAGES/DRESSINGS) ×4 IMPLANT
BNDG COHESIVE 3X5 TAN STRL LF (GAUZE/BANDAGES/DRESSINGS) IMPLANT
BNDG ESMARK 4X9 LF (GAUZE/BANDAGES/DRESSINGS) ×2 IMPLANT
BNDG GAUZE ELAST 4 BULKY (GAUZE/BANDAGES/DRESSINGS) IMPLANT
CHLORAPREP W/TINT 26ML (MISCELLANEOUS) ×3 IMPLANT
CORDS BIPOLAR (ELECTRODE) ×3 IMPLANT
COVER BACK TABLE 60X90IN (DRAPES) ×3 IMPLANT
COVER MAYO STAND STRL (DRAPES) ×3 IMPLANT
CUFF TOURNIQUET SINGLE 18IN (TOURNIQUET CUFF) ×2 IMPLANT
DECANTER SPIKE VIAL GLASS SM (MISCELLANEOUS) IMPLANT
DRAIN PENROSE 1/2X12 LTX STRL (WOUND CARE) IMPLANT
DRAPE EXTREMITY T 121X128X90 (DRAPE) ×3 IMPLANT
DRAPE SURG 17X23 STRL (DRAPES) ×3 IMPLANT
GAUZE SPONGE 4X4 12PLY STRL (GAUZE/BANDAGES/DRESSINGS) ×3 IMPLANT
GAUZE XEROFORM 1X8 LF (GAUZE/BANDAGES/DRESSINGS) ×3 IMPLANT
GLOVE BIOGEL PI IND STRL 7.0 (GLOVE) IMPLANT
GLOVE BIOGEL PI IND STRL 8.5 (GLOVE) ×1 IMPLANT
GLOVE BIOGEL PI INDICATOR 7.0 (GLOVE) ×4
GLOVE BIOGEL PI INDICATOR 8.5 (GLOVE) ×2
GLOVE ECLIPSE 6.5 STRL STRAW (GLOVE) ×2 IMPLANT
GLOVE SURG ORTHO 8.0 STRL STRW (GLOVE) ×3 IMPLANT
GOWN STRL REUS W/ TWL LRG LVL3 (GOWN DISPOSABLE) ×1 IMPLANT
GOWN STRL REUS W/TWL LRG LVL3 (GOWN DISPOSABLE) ×3
GOWN STRL REUS W/TWL XL LVL3 (GOWN DISPOSABLE) ×3 IMPLANT
NDL PRECISIONGLIDE 27X1.5 (NEEDLE) IMPLANT
NEEDLE PRECISIONGLIDE 27X1.5 (NEEDLE) ×3 IMPLANT
NS IRRIG 1000ML POUR BTL (IV SOLUTION) ×3 IMPLANT
PACK BASIN DAY SURGERY FS (CUSTOM PROCEDURE TRAY) ×3 IMPLANT
PAD CAST 3X4 CTTN HI CHSV (CAST SUPPLIES) IMPLANT
PADDING CAST ABS 3INX4YD NS (CAST SUPPLIES)
PADDING CAST ABS 4INX4YD NS (CAST SUPPLIES)
PADDING CAST ABS COTTON 3X4 (CAST SUPPLIES) IMPLANT
PADDING CAST ABS COTTON 4X4 ST (CAST SUPPLIES) ×1 IMPLANT
PADDING CAST COTTON 3X4 STRL (CAST SUPPLIES)
SPLINT FINGER 3.25 BULB 911905 (SOFTGOODS) ×4 IMPLANT
SPLINT PLASTER CAST XFAST 3X15 (CAST SUPPLIES) IMPLANT
SPLINT PLASTER XTRA FASTSET 3X (CAST SUPPLIES)
STOCKINETTE 4X48 STRL (DRAPES) ×3 IMPLANT
SUT ETHILON 4 0 PS 2 18 (SUTURE) ×3 IMPLANT
SUT ETHILON 5 0 PC 1 (SUTURE) ×2 IMPLANT
SUT VIC AB 4-0 P2 18 (SUTURE) IMPLANT
SYR BULB 3OZ (MISCELLANEOUS) ×3 IMPLANT
SYR CONTROL 10ML LL (SYRINGE) ×2 IMPLANT
TOWEL OR 17X24 6PK STRL BLUE (TOWEL DISPOSABLE) ×4 IMPLANT
UNDERPAD 30X30 (UNDERPADS AND DIAPERS) ×1 IMPLANT

## 2014-11-10 NOTE — Op Note (Signed)
Dictation Number [id number]

## 2014-11-10 NOTE — Op Note (Signed)
NAME:  Jill Dean, SHAFT NO.:  [id number]  MEDICAL RECORD NO.:  [id number]  LOCATION:                                 FACILITY:  PHYSICIAN:  [name], M.D.            DATE OF BIRTH:  DATE OF PROCEDURE:  11/10/2014 DATE OF DISCHARGE:                              OPERATIVE REPORT   PREOPERATIVE DIAGNOSIS:  Mucoid tumor, left index finger with degenerative arthritis; distal interphalangeal joint ganglion cyst, small finger, left hand.  POSTOPERATIVE DIAGNOSIS:  Mucoid tumor, left index finger with degenerative arthritis; distal interphalangeal joint ganglion cyst, small finger, left hand.  OPERATIONS:  Excision of mucoid cyst with debridement of distal interphalangeal joint, index finger; excision of cyst, small finger, left hand.  SURGEON:  [name], M.D.  ANESTHESIA:  Forearm IV regional with local infiltration metacarpal block.  ANESTHESIOLOGIST:  Soledad Gerlach, M.D.  HISTORY:  The patient is a 69 year old female with a history of masses on her index and small fingers, left hand, with degenerative arthritis, distal interphalangeal joint of the index finger.  She is desirous of having this excised.  Pre, peri, and postoperative course have been discussed along with risks and complications.  She is aware there is no guarantee with surgery; possibility of infection; recurrence of injury to arteries, nerves, tendons; incomplete relief of symptoms; dystrophy. In the preoperative area, the patient was seen, the extremity marked by both the patient and surgeon.  Antibiotic given.  DESCRIPTION OF PROCEDURE:  The patient was brought to the operating room where a forearm-based IV regional anesthetic was carried out without difficulty.  She was prepped using ChloraPrep in supine position with left arm free.  A 3-minute dry time was allowed.  Time-out taken, confirming the patient and procedure.  The small finger was attended to first.  A midlateral  incision was made on the radial aspect, the middle, then onto the distal phalanx, carried down through subcutaneous tissue. A cystic structure was immediately identified.  Neurovascular structures were identified and protected.  The cyst was found to arise from the flexor sheath and this was excised and sent to Pathology.  The area was opened.  The wound packed and irrigated.  A separate incision was then made on the dorsal aspect of the index finger, left hand, carried down through subcutaneous tissue across over the distal interphalangeal joint dorsally.  A multilobulated cyst was identified distally.  With blunt and sharp dissection, this was dissected free and removed with a hemostat rongeur.  The joint was opened.  A very significant synovitis was present.  This was debrided along with excision of osteophytes on the radial aspect of the middle phalanx.  Both wounds were copiously irrigated with saline.  The skin then closed with interrupted 5-0 nylon sutures.  Bleeders were electrocauterized throughout the procedure.  A sterile compressive dressing, splint to the finger were applied to each digit after metacarpal block was given to each with a total of 9 mL of 0.25% bupivacaine being used.  The patient tolerated the procedure well, was taken to the recovery room for observation in satisfactory condition.  She will be  discharged home to return to the [facility] in 1 week on tramadol.          ______________________________ [name], M.D.     GK/MEDQ  D:  11/10/2014  T:  11/10/2014  Job:  [id number]
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
## 2014-11-10 NOTE — Transfer of Care (Signed)
Immediate Anesthesia Transfer of Care Note  Patient: Jill Dean  Procedure(s) Performed: Procedure(s): EXCISION CYST LEFT INDEX FINGER AND CYST LEFT SMALL FINGER  (Left)  Patient Location: PACU  Anesthesia Type:MAC and Bier block  Level of Consciousness: awake, alert  and oriented  Airway & Oxygen Therapy: Patient Spontanous Breathing and Patient connected to face mask oxygen  Post-op Assessment: Report given to RN and Post -op Vital signs reviewed and stable  Post vital signs: Reviewed and stable  Last Vitals:  Filed Vitals:   11/10/14 0933  BP: 128/66  Pulse: 88  Temp: 36.5 C  Resp: 20    Complications: No apparent anesthesia complications

## 2014-11-10 NOTE — Anesthesia Preprocedure Evaluation (Addendum)
Anesthesia Evaluation  Patient identified by MRN, date of birth, ID band Patient awake    Reviewed: Allergy & Precautions, NPO status , Patient's Chart, lab work & pertinent test results  History of Anesthesia Complications (+) PONV  Airway Mallampati: II  TM Distance: >3 FB Neck ROM: Full    Dental   Pulmonary neg pulmonary ROS,    breath sounds clear to auscultation       Cardiovascular hypertension, Pt. on medications and Pt. on home beta blockers + Peripheral Vascular Disease  + dysrhythmias  Rhythm:Regular Rate:Normal     Neuro/Psych negative neurological ROS     GI/Hepatic Neg liver ROS, GERD  ,  Endo/Other  diabetesHypothyroidism Morbid obesity  Renal/GU negative Renal ROS     Musculoskeletal   Abdominal   Peds  Hematology negative hematology ROS (+)   Anesthesia Other Findings   Reproductive/Obstetrics                            Anesthesia Physical Anesthesia Plan  ASA: III  Anesthesia Plan: MAC and Bier Block   Post-op Pain Management:    Induction: Intravenous  Airway Management Planned: Simple Face Mask and Natural Airway  Additional Equipment:   Intra-op Plan:   Post-operative Plan:   Informed Consent: I have reviewed the patients History and Physical, chart, labs and discussed the procedure including the risks, benefits and alternatives for the proposed anesthesia with the patient or authorized representative who has indicated his/her understanding and acceptance.     Plan Discussed with: CRNA  Anesthesia Plan Comments:        Anesthesia Quick Evaluation

## 2014-11-10 NOTE — Discharge Instructions (Addendum)

## 2014-11-10 NOTE — Anesthesia Postprocedure Evaluation (Signed)
  Anesthesia Post-op Note  Patient: Jill Dean  Procedure(s) Performed: Procedure(s): EXCISION CYST LEFT INDEX FINGER AND CYST LEFT SMALL FINGER  (Left)  Patient Location: PACU  Anesthesia Type:MAC and Bier block  Level of Consciousness: awake and alert   Airway and Oxygen Therapy: Patient Spontanous Breathing  Post-op Pain: none  Post-op Assessment: Post-op Vital signs reviewed              Post-op Vital Signs: Reviewed  Last Vitals:  Filed Vitals:   11/10/14 1142  BP: 155/80  Pulse: 83  Temp: 36.5 C  Resp: 16    Complications: No apparent anesthesia complications

## 2014-11-10 NOTE — Brief Op Note (Signed)
11/10/2014  10:59 AM  PATIENT:  Jill Dean  69 y.o. 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Other Dictation: Dictation Number [id number]  PLAN OF CARE: Discharge to home after PACU  PATIENT DISPOSITION:  PACU - hemodynamically stable.

## 2014-11-11 ENCOUNTER — Encounter (HOSPITAL_BASED_OUTPATIENT_CLINIC_OR_DEPARTMENT_OTHER): Payer: Self-pay | Admitting: Orthopedic Surgery

## 2014-11-25 DIAGNOSIS — E119 Type 2 diabetes mellitus without complications: Secondary | ICD-10-CM | POA: Diagnosis not present

## 2014-12-04 ENCOUNTER — Other Ambulatory Visit: Payer: Self-pay | Admitting: Women's Health

## 2014-12-04 ENCOUNTER — Telehealth: Payer: Self-pay

## 2014-12-04 MED ORDER — TERCONAZOLE 0.4 % VA CREA
1.0000 | TOPICAL_CREAM | Freq: Every day | VAGINAL | Status: DC
Start: 2014-12-04 — End: 2014-12-18

## 2014-12-04 NOTE — Telephone Encounter (Signed)
Okay for [employer] 7, office visit if no relief.

## 2014-12-04 NOTE — Telephone Encounter (Signed)
Rx sent. Left patient a message home phone per DPR access note.

## 2014-12-04 NOTE — Telephone Encounter (Signed)
Patient said she has yeast infection again and wondered if you'd prescribe the Terazol 7 for her again. She has hand surgery on 11/09/14 and she said that they gave her IV antibiotics and she wonders if that is what started this. Would like Rx if possible.

## 2014-12-18 ENCOUNTER — Encounter: Payer: Self-pay | Admitting: Women's Health

## 2014-12-18 ENCOUNTER — Ambulatory Visit (INDEPENDENT_AMBULATORY_CARE_PROVIDER_SITE_OTHER): Payer: Medicare Other | Admitting: Women's Health

## 2014-12-18 VITALS — BP 122/78 | Ht 64.0 in | Wt 226.0 lb

## 2014-12-18 DIAGNOSIS — N898 Other specified noninflammatory disorders of vagina: Secondary | ICD-10-CM

## 2014-12-18 DIAGNOSIS — R350 Frequency of micturition: Secondary | ICD-10-CM | POA: Diagnosis not present

## 2014-12-18 LAB — URINALYSIS W MICROSCOPIC + REFLEX CULTURE
BACTERIA UA: NONE SEEN [HPF]
BILIRUBIN URINE: NEGATIVE
Casts: NONE SEEN [LPF]
Crystals: NONE SEEN [HPF]
GLUCOSE, UA: NEGATIVE
Hgb urine dipstick: NEGATIVE
Ketones, ur: NEGATIVE
LEUKOCYTES UA: NEGATIVE
NITRITE: NEGATIVE
PH: 6 (ref 5.0–8.0)
PROTEIN: NEGATIVE
RBC / HPF: NONE SEEN RBC/HPF (ref <=2)
Specific Gravity, Urine: 1.015 (ref 1.001–1.035)
WBC UA: NONE SEEN WBC/HPF (ref <=5)
YEAST: NONE SEEN [HPF]

## 2014-12-18 LAB — WET PREP FOR TRICH, YEAST, CLUE
CLUE CELLS WET PREP: NONE SEEN
Trich, Wet Prep: NONE SEEN
Yeast Wet Prep HPF POC: NONE SEEN

## 2014-12-18 MED ORDER — HYLAFEM VA SUPP: 1.0000 | VAGINAL | Status: DC

## 2014-12-18 NOTE — Progress Notes (Signed)
Patient ID: Jill Dean, female   DOB: [birth year], 69 y.o.   MRN: [id number] Presents with complaint of increased vaginal burning, irritation and odor. States minimal discharge. Denies abdominal pain, fever or urinary symptoms. TAH on no HRT. Not sexually active/husbands health. Diabetes reports blood sugar has been slightly elevated. Also under increased stress, husband having prostate issues, surgery scheduled Monday, daughter is pregnant, mother is in long-term care.  Exam: Appears well. Obese. External genitalia erythematous at introitus, speculum exam atrophic, erythematous, wet prep negative. UA: Negative  Vaginal irritation/vaginal atrophy  Plan: Hyla fem 1 applicator at bedtime 3, prescription, proper use given and reviewed. Instructed to call if no relief of symptoms.

## 2015-01-28 ENCOUNTER — Other Ambulatory Visit: Payer: Self-pay

## 2015-01-28 MED ORDER — METOPROLOL SUCCINATE ER 25 MG PO TB24: 25.0000 mg | ORAL_TABLET | Freq: Every day | ORAL | Status: DC

## 2015-01-28 NOTE — Telephone Encounter (Signed)
Jill Pain, MD at 10/26/2014 2:25 PM  ASSESSMENT AND PLAN:  1. Paroxysmal supraventricular tachycardia-very sensitive to vagal maneuvers. We tried diltiazem but she did not tolerate this well. I then placed her on Toprol-XL 25 mg once a day. She has done very well on this low-dose medication. Very rarely does she feel any evidence of tachycardia. She knows to take an extra metoprolol if necessary.Next step would be electrophysiology study if necessary once we have a definitive diagnosis. 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

## 2015-02-16 ENCOUNTER — Ambulatory Visit (INDEPENDENT_AMBULATORY_CARE_PROVIDER_SITE_OTHER): Payer: Medicare Other | Admitting: Adult Health

## 2015-02-16 ENCOUNTER — Encounter: Payer: Self-pay | Admitting: Adult Health

## 2015-02-16 VITALS — BP 134/86 | HR 87 | Temp 97.9°F | Ht 63.0 in | Wt 225.0 lb

## 2015-02-16 DIAGNOSIS — J018 Other acute sinusitis: Secondary | ICD-10-CM

## 2015-02-16 MED ORDER — LEVALBUTEROL HCL 0.63 MG/3ML IN NEBU
0.6300 mg | INHALATION_SOLUTION | Freq: Once | RESPIRATORY_TRACT | Status: AC
  Administered 2015-02-16: 0.63 mg via RESPIRATORY_TRACT

## 2015-02-16 MED ORDER — DOXYCYCLINE HYCLATE 100 MG PO TABS: 100.0000 mg | ORAL_TABLET | Freq: Two times a day (BID) | ORAL | Status: DC

## 2015-02-16 MED ORDER — METHYLPREDNISOLONE ACETATE 80 MG/ML IJ SUSP
80.0000 mg | Freq: Once | INTRAMUSCULAR | Status: AC
  Administered 2015-02-16: 80 mg via INTRAMUSCULAR

## 2015-02-16 NOTE — Progress Notes (Signed)
Subjective:    Patient ID: Jill Dean, female    DOB: [birth year], 70 y.o.   MRN: [id number]  HPI 70 yo female never smoker with AR, GERD , urticaria and Raynaud's Syndrome    02/16/2015 Acute OV  Pt presents for an acute office visit .  Complains of SOB, chest tightness, non prod cough,  sinus congestion/drainage, left ear tenderness, and  wheezing starting 02/14/15. No body aches or feve.r  Has sinus pressure , teeth pain and left ear feels it is draining.  Denies chest congestion, fever, nausea or vomiting. No hemoptysis , chest pain or calf pain.   She has not used otc meds.  Requests Doxycycline and steroid shot.  She has multiple abx allergies.     Past Medical History  Diagnosis Date  . Osteoarthrosis, unspecified whether generalized or localized, unspecified site   . Raynaud's syndrome   . Urticaria, unspecified   . Other chronic allergic conjunctivitis   . Obesity   . HLD (hyperlipidemia)   . Chest pain     a. s/p normal ett nuclear stress test on 10/16/13  . Complication of anesthesia   . PONV (postoperative nausea and vomiting)   . Dysrhythmia     fast heart rate  . Hypothyroidism   . GERD (gastroesophageal reflux disease)    Current Outpatient Prescriptions on File Prior to Visit  Medication Sig Dispense Refill  . ACCU-CHEK AVIVA PLUS test strip   2  . amitriptyline (ELAVIL) 25 MG tablet Take 25 mg by mouth at bedtime.      . calcium-vitamin D (OSCAL WITH D) 500-200 MG-UNIT tablet Take 1 tablet by mouth.    . cetirizine (ZYRTEC) 10 MG tablet Take 10 mg by mouth daily.      . fluticasone (FLONASE) 50 MCG/ACT nasal spray Place 2 sprays into both nostrils daily.    . Homeopathic Products (HYLAFEM) SUPP Place 1 applicator vaginally 1 day or 1 dose. 3 suppository 4  . levothyroxine (SYNTHROID, LEVOTHROID) 100 MCG tablet Take 100 mcg by mouth daily.      . Lutein 20 MG CAPS Take 1 capsule by mouth daily.      . metoprolol succinate (TOPROL-XL) 25 MG 24 hr tablet  Take 1 tablet (25 mg total) by mouth daily. 90 tablet 2  . omeprazole (PRILOSEC) 20 MG capsule Take 1 capsule (20 mg total) by mouth 2 (two) times daily before a meal. 60 capsule 0   No current facility-administered medications on file prior to visit.      Review of Systems Constitutional:   No  weight loss, night sweats,  Fevers, chills,  +fatigue, or  lassitude.  HEENT:   No headaches,  Difficulty swallowing,  Tooth/dental problems, or  Sore throat,                No sneezing, itching,  +ear ache, nasal congestion, post nasal drip,   CV:  No chest pain,  Orthopnea, PND, swelling in lower extremities, anasarca, dizziness, palpitations, syncope.   GI  No heartburn, indigestion, abdominal pain, nausea, vomiting, diarrhea, change in bowel habits, loss of appetite, bloody stools.   Resp:    No chest wall deformity  Skin: no rash or lesions.  GU: no dysuria, change in color of urine, no urgency or frequency.  No flank pain, no hematuria   MS:  No joint pain or swelling.  No decreased range of motion.  No back pain.  Psych:  No change in mood or affect.  No depression or anxiety.  No memory loss.         Objective:   Physical Exam  Filed Vitals:   02/16/15 0927  BP: 134/86  Pulse: 87  Temp: 97.9 F (36.6 C)  TempSrc: Oral  Height: 5\' 3"  (1.6 m)  Weight: 225 lb (102.059 kg)  SpO2: 96%     GEN: A/Ox3; pleasant , NAD, elderly   HEENT:  [place]/AT,  EACs-clear, TMs-mild edema on left  NOSE-clear, THROAT-clear, no lesions, no postnasal drip or exudate noted.   NECK:  Supple w/ fair ROM; no JVD; normal carotid impulses w/o bruits; no thyromegaly or nodules palpated; no lymphadenopathy.  RESP  Clear  P & A; w/o, wheezes/ rales/ or rhonchi.no accessory muscle use, no dullness to percussion  CARD:  RRR, no m/r/g  , no peripheral edema, pulses intact, no cyanosis or clubbing.  GI:   Soft & nt; nml bowel sounds; no organomegaly or masses detected.  Musco: Warm bil, no  deformities or joint swelling noted.   Neuro: alert, no focal deficits noted.    Skin: Warm, no lesions or rashes      Assessment & Plan:

## 2015-02-16 NOTE — Patient Instructions (Signed)
Doxycycline 100mg  Twice daily  For 7 days . -take with food.  Depo medrol shot today.  Mucinex DM Twice daily  As needed  Cough/congestion  Fluids and rest  Saline nasal rinses As needed   Please contact office for sooner follow up if symptoms do not improve or worsen or seek emergency care

## 2015-02-16 NOTE — Assessment & Plan Note (Addendum)
URI vs early sinusitis  xopenex neb x 1 in office    Plan  Doxycycline 100mg  Twice daily  For 7 days . -take with food.  Depo medrol shot today.  Mucinex DM Twice daily  As needed  Cough/congestion  Fluids and rest  Saline nasal rinses As needed   Please contact office for sooner follow up if symptoms do not improve or worsen or seek emergency care

## 2015-02-26 ENCOUNTER — Other Ambulatory Visit: Payer: Self-pay | Admitting: Cardiology

## 2015-03-03 ENCOUNTER — Telehealth: Payer: Self-pay | Admitting: Internal Medicine

## 2015-03-03 NOTE — Telephone Encounter (Signed)
Try adding otc nasal spray Nasalcrom/cromol

## 2015-03-03 NOTE — Telephone Encounter (Signed)
Spoke with pt. States that her allergies are flaring up. Reports sinus congestion, PND, runny nose and cough. Mucus is clear at this time. Has been taking Mucinex, Zyrtec and nasal spray with no relief. Her symptoms started 3 days ago. Would like recommendations from CY. Offered an appointment with CY tomorrow, she declined.  CY - please advise. Thanks.  Allergies  Allergen Reactions  . Hydrocod Polst-Cpm Polst Er Other (See Comments)    hyperactivity  . Pseudoephedrine Palpitations and Other (See Comments)    REACTION: tachycardia  . Sulfonamide Derivatives Anaphylaxis and Swelling  . Azithromycin Itching  . Codeine Itching  . Dilaudid [Hydromorphone Hcl] Itching  . Morphine And Related Itching  . Penicillins Itching  . Shrimp [Shellfish Allergy] Itching  . Clarithromycin Itching    REACTION: severe itching  . Lidocaine Other (See Comments)    unknown   Current Outpatient Prescriptions on File Prior to Visit  Medication Sig Dispense Refill  . ACCU-CHEK AVIVA PLUS test strip   2  . amitriptyline (ELAVIL) 25 MG tablet Take 25 mg by mouth at bedtime.      . calcium-vitamin D (OSCAL WITH D) 500-200 MG-UNIT tablet Take 1 tablet by mouth.    . cetirizine (ZYRTEC) 10 MG tablet Take 10 mg by mouth daily.      Marland Kitchen doxycycline (VIBRA-TABS) 100 MG tablet Take 1 tablet (100 mg total) by mouth 2 (two) times daily. 14 tablet 0  . fluticasone (FLONASE) 50 MCG/ACT nasal spray Place 2 sprays into both nostrils daily.    . fluticasone (FLONASE) 50 MCG/ACT nasal spray Place 2 sprays into the nose daily as needed.    . Homeopathic Products (HYLAFEM) SUPP Place 1 applicator vaginally 1 day or 1 dose. 3 suppository 4  . levothyroxine (SYNTHROID, LEVOTHROID) 100 MCG tablet Take 100 mcg by mouth daily.      . Lutein 20 MG CAPS Take 1 capsule by mouth daily.      . metoprolol succinate (TOPROL-XL) 25 MG 24 hr tablet Take 1 tablet (25 mg total) by mouth daily. 90 tablet 2  . omeprazole (PRILOSEC) 20  MG capsule Take 1 capsule (20 mg total) by mouth 2 (two) times daily before a meal. 60 capsule 0   No current facility-administered medications on file prior to visit.

## 2015-03-03 NOTE — Telephone Encounter (Signed)
Spoke with pt. She is aware of CY's recommendations. Nothing further was needed at this time. 

## 2015-03-09 ENCOUNTER — Telehealth: Payer: Self-pay | Admitting: Internal Medicine

## 2015-03-09 MED ORDER — DOXYCYCLINE HYCLATE 100 MG PO TABS: ORAL_TABLET | ORAL | Status: DC

## 2015-03-09 NOTE — Telephone Encounter (Signed)
Patient notified of Dr. Young's recommendations. Rx sent to pharmacy. Nothing further needed.  

## 2015-03-09 NOTE — Telephone Encounter (Signed)
Coughing up yellow mucus, blowing out yellow from nose. Drainage down throat. No fever.  Patient says that she has allergies.   Pharmacy: [facility]  Allergies  Allergen Reactions  . Hydrocod Polst-Cpm Polst Er Other (See Comments)    hyperactivity  . Pseudoephedrine Palpitations and Other (See Comments)    REACTION: tachycardia  . Sulfonamide Derivatives Anaphylaxis and Swelling  . Azithromycin Itching  . Codeine Itching  . Dilaudid [Hydromorphone Hcl] Itching  . Morphine And Related Itching  . Penicillins Itching  . Shrimp [Shellfish Allergy] Itching  . Clarithromycin Itching    REACTION: severe itching  . Lidocaine Other (See Comments)    unknown

## 2015-03-09 NOTE — Telephone Encounter (Signed)
If this seems like infection ( sore throat, fever, feel bad) offer doxycycline 100 mg, # 8, 2 today then one daily  If it seems more like allergy ( itch, sneeze)  Then suggest an otc antihistamine like claritin, plus an otc nasal spray like flonase  If

## 2015-03-15 DIAGNOSIS — M859 Disorder of bone density and structure, unspecified: Secondary | ICD-10-CM | POA: Diagnosis not present

## 2015-03-15 DIAGNOSIS — M8589 Other specified disorders of bone density and structure, multiple sites: Secondary | ICD-10-CM | POA: Diagnosis not present

## 2015-04-19 DIAGNOSIS — E785 Hyperlipidemia, unspecified: Secondary | ICD-10-CM | POA: Diagnosis not present

## 2015-04-19 DIAGNOSIS — E119 Type 2 diabetes mellitus without complications: Secondary | ICD-10-CM | POA: Diagnosis not present

## 2015-04-19 DIAGNOSIS — R0789 Other chest pain: Secondary | ICD-10-CM | POA: Diagnosis not present

## 2015-04-26 ENCOUNTER — Ambulatory Visit (INDEPENDENT_AMBULATORY_CARE_PROVIDER_SITE_OTHER): Payer: Medicare Other | Admitting: Cardiology

## 2015-04-26 ENCOUNTER — Encounter: Payer: Self-pay | Admitting: Cardiology

## 2015-04-26 VITALS — BP 132/78 | HR 91 | Ht 63.5 in | Wt 223.4 lb

## 2015-04-26 DIAGNOSIS — R002 Palpitations: Secondary | ICD-10-CM

## 2015-04-26 DIAGNOSIS — Z79899 Other long term (current) drug therapy: Secondary | ICD-10-CM | POA: Diagnosis not present

## 2015-04-26 DIAGNOSIS — E785 Hyperlipidemia, unspecified: Secondary | ICD-10-CM

## 2015-04-26 MED ORDER — ATORVASTATIN CALCIUM 20 MG PO TABS: 20.0000 mg | ORAL_TABLET | Freq: Every day | ORAL | Status: DC

## 2015-04-26 NOTE — Patient Instructions (Signed)
Medication Instructions:  Please start Atorvastatin 20 mg a day. Continue all other medications as listed.  Labwork: Please have blood work in 2 months (Lipid and ALT).  Follow up in 2 months with Dr Marlou Porch.  If you need a refill on your cardiac medications before your next appointment, please call your pharmacy.  Thank you for choosing [facility]!!

## 2015-04-26 NOTE — Progress Notes (Signed)
[facility]. [address], [place]  [address] Phone: [phone] Fax:  [phone]  Date:  04/26/2015   ID:  [name], DOB 1945-09-22, MRN [id number]  PCP:  Jill Dawson, MD   History of Present Illness: Jill Dean is a 70 y.o. female with presumed supraventricular tachycardia of 200 bpm per EMS on monitor, no strips that resolved with vagal maneuvers in early October 2015. She was placed on diltiazem but felt poorly on this medication and this was stopped. Subsequently, monitoring demonstrated in December 2015 PVC/PACs and sinus tachycardia only. No adverse arrhythmias. We started Toprol-XL 25 mg once a day.  Maybe has 1 min episode of PSVT, Valsalva maneuver still work. She's not having any chest pain. She does have leg cramps, calf cramps at night. She is sad that Dr Amedeo Kinsman is leaving. She did discuss the possibility of starting cholesterol medication. I think this is a good idea. We will go ahead and give her atorvastatin. See below. She is worried about cramping..   Had cardiac catheterization which was unremarkable. Echocardiogram was reassuring. Treadmill test was reassuring. Does not wish to take statins.  After leaving hospital, she had a brief episode of tachycardia arrhythmia which terminated with Valsalva. No syncope, no chest pain.  Vit D was low, diffuse pain.   Has not had any racing. +Hot flash.  Sometimes has sweats. Emotional at times.  Excited about her daughter, she is  a grandmother.  Wt Readings from Last 3 Encounters:  04/26/15 223 lb 6.4 oz (101.334 kg)  02/16/15 225 lb (102.059 kg)  12/18/14 226 lb (102.513 kg)     Past Medical History  Diagnosis Date  . Osteoarthrosis, unspecified whether generalized or localized, unspecified site   . Raynaud's syndrome   . Urticaria, unspecified   . Other chronic allergic conjunctivitis   . Obesity   . HLD (hyperlipidemia)   . Chest pain     a. s/p normal ett nuclear stress test  on 10/16/13  . Complication of anesthesia   . PONV (postoperative nausea and vomiting)   . Dysrhythmia     fast heart rate  . Hypothyroidism   . GERD (gastroesophageal reflux disease)     Past Surgical History  Procedure Laterality Date  . Thyroid nodule    . Knot right breast    . Tonsillectomy    . Vesicovaginal fistula closure w/ tah    . Cholecystectomy    . Oophorectomy      LSO 84-RSO 04  . Left heart catheterization with coronary angiogram N/A 10/22/2013    Procedure: LEFT HEART CATHETERIZATION WITH CORONARY ANGIOGRAM;  Surgeon: [name], MD;  Location: [facility];  Service: Cardiovascular;  Laterality: N/A;  . Esophagogastroduodenoscopy N/A 01/05/2014    Procedure: ESOPHAGOGASTRODUODENOSCOPY (EGD);  Surgeon: [name], MD;  Location: [facility];  Service: Endoscopy;  Laterality: N/A;  . Vaginal hysterectomy  2004    LAV[facility] RSO endometriosis  . Cyst excision Left 11/10/2014    Procedure: EXCISION CYST LEFT INDEX FINGER AND CYST LEFT SMALL FINGER ;  Surgeon: [name], MD;  Location: [facility];  Service: Orthopedics;  Laterality: Left;    Current Outpatient Prescriptions  Medication Sig Dispense Refill  . ACCU-CHEK AVIVA PLUS test strip 1 each by Other route as needed (check blood sugar).   2  . amitriptyline (ELAVIL) 25 MG tablet Take 25 mg by mouth at bedtime.      Marland Kitchen  calcium-vitamin D (OSCAL WITH D) 500-200 MG-UNIT tablet Take 1 tablet by mouth daily with breakfast.     . cetirizine (ZYRTEC) 10 MG tablet Take 10 mg by mouth daily.      Marland Kitchen doxycycline (VIBRA-TABS) 100 MG tablet Take 1 tablet (100 mg total) by mouth 2 (two) times daily. 14 tablet 0  . fluticasone (FLONASE) 50 MCG/ACT nasal spray Place 2 sprays into both nostrils daily.    . Homeopathic Products (HYLAFEM) SUPP Place 1 applicator vaginally 1 day or 1 dose. 3 suppository 4  . levothyroxine (SYNTHROID, LEVOTHROID) 100 MCG tablet Take 100 mcg by mouth daily.      . Lutein 20 MG CAPS  Take 1 capsule by mouth daily.      . metoprolol succinate (TOPROL-XL) 25 MG 24 hr tablet Take 1 tablet (25 mg total) by mouth daily. 90 tablet 2  . omeprazole (PRILOSEC) 20 MG capsule Take 1 capsule (20 mg total) by mouth 2 (two) times daily before a meal. 60 capsule 0  . atorvastatin (LIPITOR) 20 MG tablet Take 1 tablet (20 mg total) by mouth daily. 30 tablet 6   No current facility-administered medications for this visit.    Allergies:    Allergies  Allergen Reactions  . Hydrocod Polst-Cpm Polst Er Other (See Comments)    hyperactivity  . Pseudoephedrine Palpitations and Other (See Comments)    REACTION: tachycardia  . Sulfonamide Derivatives Anaphylaxis and Swelling  . Azithromycin Itching  . Codeine Itching  . Dilaudid [Hydromorphone Hcl] Itching  . Morphine And Related Itching  . Penicillins Itching  . Shrimp [Shellfish Allergy] Itching  . Clarithromycin Itching    REACTION: severe itching  . Lidocaine Other (See Comments)    unknown    Social History:  The patient  reports that she has never smoked. She has never used smokeless tobacco. She reports that she drinks alcohol. She reports that she does not use illicit drugs.   Family History  Problem Relation Age of Onset  . Lung cancer Father   . Heart disease Father   . Heart failure Mother   . Dementia Mother   . Heart disease Maternal Uncle   . Lung cancer Paternal Uncle   . COPD Paternal Uncle     ROS:  Please see the history of present illness.   Denies any syncope, bleeding, orthopnea, PND  All other systems reviewed and negative.   PHYSICAL EXAM: VS:  BP 132/78 mmHg  Pulse 91  Ht 5' 3.5" (1.613 m)  Wt 223 lb 6.4 oz (101.334 kg)  BMI 38.95 kg/m2 Well nourished, well developed, in no acute distress HEENT: normal, /AT, EOMI Neck: no JVD, normal carotid upstroke, no bruit Cardiac:  normal S1, S2; RRR; no murmur Lungs:  clear to auscultation bilaterally, no wheezing, rhonchi or rales Abd: soft, nontender,  no hepatomegaly, no bruits Ext: no edema, 2+ distal pulses overweight Skin: warm and dry GU: deferred Neuro: no focal abnormalities noted, AAO x 3  EKG:  EKG is ordered today. 4/20 04/09/2015-sinus rhythm, 91, right bundle branch block, nonspecific ST-T wave changes, left axis deviation personally viewed.  Event monitor: 12/2013-PACs/PVCs, no adverse arrhythmias. Sinus tachycardia only.  ASSESSMENT AND PLAN:  1. Paroxysmal supraventricular tachycardia-very sensitive to vagal maneuvers. We tried diltiazem but she did not tolerate this well. I then placed her on Toprol-XL 25 mg once a day. She has done very well on this low-dose medication.  Very rarely does she feel any evidence of tachycardia. She knows  to take an extra metoprolol if necessary.Next step would be electrophysiology study if necessary once we have a definitive diagnosis. 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 2. Obesity-encourage weight loss. She has been struggling with this. Discussed decreasing carbohydrates. Her daughter is close to [redacted] weeks pregnant. 3. Hypertension essential-well controlled. 4. Type 2 diabetes-hemoglobin A1c 7, diet controlled. 5. She is taking vitamin D supplements to hopefully help her with her diffuse aching. 6. Hyperlipidemia-I will go ahead and start atorvastatin 20 mg once a day. Check lipid panel and ALT in 2 months. This was requested by Dr. Amedeo Kinsman. LDL 154 prior to tx.  7. GERD-PPI. Question part of chest pain. 8. Raynaud's phenomenon-stable. 9.  2 month follow-up  Signed, Candee Furbish, MD [facility]  04/26/2015 2:28 PM

## 2015-05-19 ENCOUNTER — Encounter: Payer: Self-pay | Admitting: Internal Medicine

## 2015-05-19 ENCOUNTER — Ambulatory Visit (INDEPENDENT_AMBULATORY_CARE_PROVIDER_SITE_OTHER): Payer: Medicare Other | Admitting: Internal Medicine

## 2015-05-19 VITALS — BP 118/78 | HR 80 | Ht 63.0 in | Wt 223.4 lb

## 2015-05-19 DIAGNOSIS — H6590 Unspecified nonsuppurative otitis media, unspecified ear: Secondary | ICD-10-CM | POA: Insufficient documentation

## 2015-05-19 DIAGNOSIS — J018 Other acute sinusitis: Secondary | ICD-10-CM | POA: Diagnosis not present

## 2015-05-19 DIAGNOSIS — J0101 Acute recurrent maxillary sinusitis: Secondary | ICD-10-CM | POA: Diagnosis not present

## 2015-05-19 DIAGNOSIS — H65191 Other acute nonsuppurative otitis media, right ear: Secondary | ICD-10-CM | POA: Diagnosis not present

## 2015-05-19 MED ORDER — METHYLPREDNISOLONE ACETATE 80 MG/ML IJ SUSP
80.0000 mg | Freq: Once | INTRAMUSCULAR | Status: AC
  Administered 2015-05-19: 80 mg via INTRAMUSCULAR

## 2015-05-19 MED ORDER — PHENYLEPHRINE HCL 1 % NA SOLN
3.0000 [drp] | Freq: Once | NASAL | Status: AC
Start: 2015-05-19 — End: 2015-05-19
  Administered 2015-05-19: 3 [drp] via NASAL

## 2015-05-19 MED ORDER — DOXYCYCLINE HYCLATE 100 MG PO TABS: 100.0000 mg | ORAL_TABLET | Freq: Two times a day (BID) | ORAL | Status: DC

## 2015-05-19 NOTE — Assessment & Plan Note (Signed)
Less likely acute otitis media right ear. Limited medication tolerance. Plan-extended course of doxycycline since she feels she can take that best. If she fails to clear, I recommend ENT evaluation.

## 2015-05-19 NOTE — Patient Instructions (Addendum)
Script sent for doxycycline  Neb neo nasal     Dx acute recurrent maxillary sinusitis  Depo 80  If your right ear keeps bothering you, suggest you see an

## 2015-05-19 NOTE — Assessment & Plan Note (Signed)
Acute bilateral recurrent maxillary sinusitis Plan-extended course of doxycycline, saline nasal rinse, nasal nebulizer decongestant treatment, Depo-Medrol

## 2015-05-19 NOTE — Progress Notes (Signed)
Patient ID: Jill Dean, female    DOB: 08/13/1945, 70 y.o.   MRN: [id number]  HPI 07/25/10- 17 yoF never smoker, followed for allergic rhinitis/ conjunctivitis complicated by hx Raynaud's synd, GERD, urticaria Last here January 23,2012  Since we gave neb and depo in January she reports doing well, with no significant problems through the change of seasons. Notes 'sometimes" drip or sniff. Takes zyrtec almost every day. This permits her to go in and out of doors with no problem. Has not had bronchitis.  10/14/10-  3 yoF never smoker, followed for allergic rhinitis/ conjunctivitis complicated by hx Raynaud's synd, GERD, urticaria She had to resume a regular uses ear to make for nasal congestion and drainage. Recent exposure to somebody sick lead to her having a URI/bronchitis syndrome 3 days later. Nasal congestion, chest congestion, sore throat, maybe some fever, blowing yellow-green.  04/17/11- 38 yoF never smoker, followed for allergic rhinitis/ conjunctivitis complicated by hx Raynaud's synd, GERD, urticaria Had a bronchitis last October which resolved. After that winter was good until current pollen season. Over the weekend developed right maxillary sinus pressure and pain.Using Neti pot. Can't tolerate decongestant stimulants because of blood pressure.  05/09/11-  66 yoF never smoker, followed for allergic rhinitis/ conjunctivitis complicated by hx Raynaud's synd, GERD, urticaria Pt c/o prod cough with large amounts of yellow sputum, sore throat, HA, chest tightness, chills and sweats> onset 1 day ago . Spent weekend at the beach if exposed wind and chills. Came home sick. Now starting doxycycline.  06/03/12-67 yoF never smoker, followed for allergic rhinitis/ conjunctivitis complicated by hx Raynaud's synd, GERD, urticaria, DM FOLLOWS FOR: breathing has been doing well. reports lots of congestion, runny nose and sneezing. denies SOB or chest tightness. Using Afrin nasal spray recently to help  sleep area and we discussed this carefully. 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 Increased nasal congestion, sneezing and blowing. Can't tolerate decongestant pills. Newly diagnosed with diabetes, diet controlled.  07/08/12-67 yoF never smoker, followed for allergic rhinitis/ conjunctivitis complicated by hx Raynaud's synd, GERD, urticaria, DM ACUTE VISIT: pt c/o congested head, pnd,f/c/s, cough w green nasal drainage, denies any SOB  x 3 days started doxy 2 days ago w no improvement  Frontal HA, no fever. Exposed to sick daughter.  09/09/12- 67 yoF never smoker, followed for allergic rhinitis/ conjunctivitis complicated by hx Raynaud's synd, GERD, urticaria, DM ACUTE VISIT: 3 nights ago was out in "night air". Next day started having a "raw" throat. Since then has chest congestion/tightness; cough, and SOB.low-grade fever yesterday. Cough, productive yellow green.  06/04/13- 67 yoF never smoker, followed for allergic rhinitis/ conjunctivitis complicated by hx Raynaud's synd, GERD, urticaria, DM FOLLOWS FOR: has been doing well overall; denies any wheezing, cough, congestion, or SOB Daily saline nasal spray big help. Asks antibiotic to carry for beach trip. Zyrtec, Flonase prn.  06/08/14- 67 yoF never smoker, followed for allergic rhinitis/ conjunctivitis complicated by hx Raynaud's synd, GERD, urticaria, DM Reports: pt. having yellowish mucus out the nose. left ear pain 'pressure' feeling. For the last several days as had nasal congestion, uncomfortable pressure in her ears, some yellow discharge from nose and chest. No fever, sore throat, adenopathy or GI upset.  05/19/2015-70 year old female never smoker followed for allergic rhinitis/conjunctivitis, Located by history Raynaud's syndrome, GERD, urticaria, DM, PSVT LOV- 02/16/2015-acute rhinosinusitis treated with doxycycline, Depo-Medrol FOLLOWS FOR:  Pt c/o sinus infection x 2-3 weeks with ear pain and dizziness, dry cough. Pt states that she  can  feel her ears draining.. 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 Increased nasal congestion. Ears feel stopped up.  Review of Systems-See HPI Constitutional:   No-   weight loss, night sweats, fevers, chills, fatigue, lassitude. HEENT:   + headaches, difficulty swallowing, tooth/dental problems, +sore throat,       sneezing, itching, ear ache, +nasal congestion, +post nasal drip,  CV:  No-   chest pain, orthopnea, PND, swelling in lower extremities, anasarca,  dizziness, palpitations Resp: No-   shortness of breath with exertion or at rest.            +-productive cough,  No non-productive cough,  No-  coughing up of blood.              +-change in color of mucus.  No- wheezing.    Objective:   Physical Exam General- Alert, Oriented, Affect-appropriate, Distress- none acute  Overweight Skin- rash-none, lesions- none, excoriation- none Lymphadenopathy- none Head- atraumatic            Eyes- Gross vision intact, PERRLA, conjunctivae clear secretions, +periorbital edema            Ears- Hearing, + R TM red with debris and a retained hair in canal            Nose- +mucus, ., No-Septal dev,  polyps, erosion, perforation, + raspy voice            Throat- Mallampati III-IV , mucosa clear-not red , drainage- none, tonsils- atrophic.  Neck- flexible , trachea midline, no stridor , thyroid nl, carotid no bruit Chest -            Lung- clear to P&A but diminished, wheeze- none, cough- none , dullness-none, rub- none           Chest wall-  Abd-  Br/ Gen/ Rectal- Not done, not indicated Extrem- cyanosis- none, clubbing, none, atrophy- none, strength- nl Neuro- grossly intact to observation

## 2015-06-09 ENCOUNTER — Telehealth: Payer: Self-pay | Admitting: Internal Medicine

## 2015-06-09 ENCOUNTER — Encounter: Payer: Self-pay | Admitting: Internal Medicine

## 2015-06-09 ENCOUNTER — Ambulatory Visit (INDEPENDENT_AMBULATORY_CARE_PROVIDER_SITE_OTHER): Payer: Medicare Other | Admitting: Internal Medicine

## 2015-06-09 VITALS — BP 116/78 | HR 86 | Ht 63.0 in | Wt 219.4 lb

## 2015-06-09 DIAGNOSIS — J018 Other acute sinusitis: Secondary | ICD-10-CM | POA: Diagnosis not present

## 2015-06-09 DIAGNOSIS — J0101 Acute recurrent maxillary sinusitis: Secondary | ICD-10-CM

## 2015-06-09 MED ORDER — METHYLPREDNISOLONE ACETATE 80 MG/ML IJ SUSP
80.0000 mg | Freq: Once | INTRAMUSCULAR | Status: AC
  Administered 2015-06-09: 80 mg via INTRAMUSCULAR

## 2015-06-09 MED ORDER — PHENYLEPHRINE HCL 1 % NA SOLN
3.0000 [drp] | Freq: Once | NASAL | Status: AC
Start: 2015-06-09 — End: 2015-06-09
  Administered 2015-06-09: 3 [drp] via NASAL

## 2015-06-09 MED ORDER — CLINDAMYCIN HCL 300 MG PO CAPS: 300.0000 mg | ORAL_CAPSULE | Freq: Three times a day (TID) | ORAL | Status: DC

## 2015-06-09 NOTE — Telephone Encounter (Signed)
Offer cleocin 300 mg, # 21,    1, three times daily

## 2015-06-09 NOTE — Assessment & Plan Note (Signed)
Suspect she has acute on chronic right maxillary sinusitis with eustachian dysfunction. Very limited antibiotic tolerance. Can't take decongestants because for heart rhythm. Plan-nasal nebulizer decongestant, Depo-Medrol, Cleocin, referred to ENT anticipating they will want to do their own CT scan.

## 2015-06-09 NOTE — Patient Instructions (Signed)
Neb neo nasal      Dx acute recurrent maxillary sinusitis  Depo 46  Order- referral to [facility] ENT    Dx recurrent maxillary sinusitis with eustachian dysfunction

## 2015-06-09 NOTE — Progress Notes (Signed)
Patient ID: Jill Dean, female    DOB: 1945-12-22, 70 y.o.   MRN: [id number]  HPI 07/25/10- 53 yoF never smoker, followed for allergic rhinitis/ conjunctivitis complicated by hx Raynaud's synd, GERD, urticaria Last here January 23,2012  Since we gave neb and depo in January she reports doing well, with no significant problems through the change of seasons. Notes 'sometimes" drip or sniff. Takes zyrtec almost every day. This permits her to go in and out of doors with no problem. Has not had bronchitis.  10/14/10-  26 yoF never smoker, followed for allergic rhinitis/ conjunctivitis complicated by hx Raynaud's synd, GERD, urticaria She had to resume a regular uses ear to make for nasal congestion and drainage. Recent exposure to somebody sick lead to her having a URI/bronchitis syndrome 3 days later. Nasal congestion, chest congestion, sore throat, maybe some fever, blowing yellow-green.  04/17/11- 63 yoF never smoker, followed for allergic rhinitis/ conjunctivitis complicated by hx Raynaud's synd, GERD, urticaria Had a bronchitis last October which resolved. After that winter was good until current pollen season. Over the weekend developed right maxillary sinus pressure and pain.Using Neti pot. Can't tolerate decongestant stimulants because of blood pressure.  05/09/11-  66 yoF never smoker, followed for allergic rhinitis/ conjunctivitis complicated by hx Raynaud's synd, GERD, urticaria Pt c/o prod cough with large amounts of yellow sputum, sore throat, HA, chest tightness, chills and sweats> onset 1 day ago . Spent weekend at the beach if exposed wind and chills. Came home sick. Now starting doxycycline.   05/19/2015-70 year old female never smoker followed for allergic rhinitis/conjunctivitis, Located by history Raynaud's syndrome, GERD, urticaria, DM, PSVT LOV- 02/16/2015-acute rhinosinusitis treated with doxycycline, Depo-Medrol FOLLOWS FOR:  Pt c/o sinus infection x 2-3 weeks with ear pain  and dizziness, dry cough. Pt states that she can feel her ears draining.. 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 Increased nasal congestion. Ears feel stopped up.  06/09/2015-70 year old female never smoker followed for allergic rhinitis/conjunctivitis, complicated by history Raynaud's syndrome, GERD, urticaria, DM, PSVT We had tried Nasalcrom, doxycycline, Claritin/Flonase FOLLOWS FOR: Pt states she is not feeling better since last visit 05-19-15. Continues to have issues with ears and feeling full. She has been complaining about head and ear pressure/congestion with some drainage intermittently since winter. Felt better as she took 14 days of doxycycline but now right ear feels full again. Ear does not pop. She can't take decongestants because of her SVT. She wanted intervention before she goes to the beach this week  Review of Systems-See HPI Constitutional:   No-   weight loss, night sweats, fevers, chills, fatigue, lassitude. HEENT:   + headaches, difficulty swallowing, tooth/dental problems, +sore throat,       sneezing, itching, ear ache, +nasal congestion, +post nasal drip,  CV:  No-   chest pain, orthopnea, PND, swelling in lower extremities, anasarca,  dizziness, palpitations Resp: No-   shortness of breath with exertion or at rest.            +-productive cough,  No non-productive cough,  No-  coughing up of blood.              +-change in color of mucus.  No- wheezing.    Objective:   Physical Exam General- Alert, Oriented, Affect-appropriate, Distress- none acute  Overweight Skin- rash-none, lesions- none, excoriation- none Lymphadenopathy- none Head- atraumatic  Eyes- Gross vision intact, PERRLA, conjunctivae clear secretions, +periorbital edema            Ears- Hearing, + R TM dull            Nose- +mucus, , No-Septal dev,  polyps, erosion, perforation,             Throat- Mallampati III-IV , mucosa clear-not  red , drainage- none, tonsils- atrophic.  Neck- flexible , trachea midline, no stridor , thyroid nl, carotid no bruit Chest -            Lung- clear to P&A but diminished, wheeze- none, cough- none , dullness-none, rub- none           Chest wall-  Abd-  Br/ Gen/ Rectal- Not done, not indicated Extrem- cyanosis- none, clubbing, none, atrophy- none, strength- nl Neuro- grossly intact to observation

## 2015-06-09 NOTE — Telephone Encounter (Signed)
Spoke with pt. She is aware of CY's recommendation. Rx has been sent in. Nothing further was needed.  

## 2015-06-09 NOTE — Telephone Encounter (Signed)
Attempted to contact pt. No answer, no option to leave a message. Will try back.  

## 2015-06-09 NOTE — Telephone Encounter (Signed)
Spoke with pt. Pt was seen today by CY. She needs an antibiotic sent in for her ear. ENT can't see her until 07/02/15. Pt is leaving for the beach on Saturday and wants to feel better before then. CY - please advise. Thanks.  Allergies  Allergen Reactions  . Hydrocod Polst-Cpm Polst Er Other (See Comments)    hyperactivity  . Pseudoephedrine Palpitations and Other (See Comments)    REACTION: tachycardia  . Sulfonamide Derivatives Anaphylaxis and Swelling  . Azithromycin Itching  . Codeine Itching  . Dilaudid [Hydromorphone Hcl] Itching  . Morphine And Related Itching  . Penicillins Itching  . Shrimp [Shellfish Allergy] Itching  . Cephalosporins Swelling    Said to cause facial swelling  . Clarithromycin Itching    REACTION: severe itching  . Lidocaine Other (See Comments)    unknown   Current Outpatient Prescriptions on File Prior to Visit  Medication Sig Dispense Refill  . ACCU-CHEK AVIVA PLUS test strip 1 each by Other route as needed (check blood sugar).   2  . amitriptyline (ELAVIL) 25 MG tablet Take 25 mg by mouth at bedtime.      . calcium-vitamin D (OSCAL WITH D) 500-200 MG-UNIT tablet Take 1 tablet by mouth daily with breakfast.     . cetirizine (ZYRTEC) 10 MG tablet Take 10 mg by mouth daily.      Marland Kitchen doxycycline (VIBRA-TABS) 100 MG tablet Take 1 tablet (100 mg total) by mouth 2 (two) times daily. 28 tablet 0  . fluticasone (FLONASE) 50 MCG/ACT nasal spray Place 2 sprays into both nostrils daily.    Marland Kitchen levothyroxine (SYNTHROID, LEVOTHROID) 100 MCG tablet Take 100 mcg by mouth daily.      . Lutein 20 MG CAPS Take 1 capsule by mouth daily.      . metoprolol succinate (TOPROL-XL) 25 MG 24 hr tablet Take 1 tablet (25 mg total) by mouth daily. 90 tablet 2  . omeprazole (PRILOSEC) 20 MG capsule Take 1 capsule (20 mg total) by mouth 2 (two) times daily before a meal. 60 capsule 0   No current facility-administered medications on file prior to visit.

## 2015-06-21 ENCOUNTER — Other Ambulatory Visit (INDEPENDENT_AMBULATORY_CARE_PROVIDER_SITE_OTHER): Payer: Medicare Other | Admitting: *Deleted

## 2015-06-21 DIAGNOSIS — E785 Hyperlipidemia, unspecified: Secondary | ICD-10-CM

## 2015-06-21 DIAGNOSIS — Z79899 Other long term (current) drug therapy: Secondary | ICD-10-CM | POA: Diagnosis not present

## 2015-06-21 LAB — ALT: ALT: 15 U/L (ref 6–29)

## 2015-06-21 LAB — LIPID PANEL
Cholesterol: 223 mg/dL — ABNORMAL HIGH (ref 125–200)
HDL: 59 mg/dL (ref >=46)
LDL CALC: 114 mg/dL (ref <130)
Total CHOL/HDL Ratio: 3.8 Ratio (ref <=5.0)
Triglycerides: 249 mg/dL — ABNORMAL HIGH (ref <150)
VLDL: 50 mg/dL — ABNORMAL HIGH (ref <30)

## 2015-06-21 NOTE — Addendum Note (Signed)
Addended by: Eulis Foster on: 06/21/2015 10:07 AM   Modules accepted: Orders

## 2015-06-28 ENCOUNTER — Encounter: Payer: Self-pay | Admitting: Cardiology

## 2015-06-28 ENCOUNTER — Ambulatory Visit (INDEPENDENT_AMBULATORY_CARE_PROVIDER_SITE_OTHER): Payer: Medicare Other | Admitting: Cardiology

## 2015-06-28 VITALS — BP 122/72 | HR 68 | Ht 63.5 in | Wt 218.8 lb

## 2015-06-28 DIAGNOSIS — E785 Hyperlipidemia, unspecified: Secondary | ICD-10-CM | POA: Diagnosis not present

## 2015-06-28 DIAGNOSIS — I471 Supraventricular tachycardia: Secondary | ICD-10-CM | POA: Diagnosis not present

## 2015-06-28 NOTE — Patient Instructions (Signed)

## 2015-06-28 NOTE — Progress Notes (Signed)
[facility]. [address], [address] [place], [place]  [address] Phone: [phone] Fax:  [phone]  Date:  06/28/2015   ID:  Jill Dean, DOB [birth year], MRN [id number]  PCP:  Lottie Dawson, MD   History of Present Illness: Jill Dean is a 69 y.o. female with presumed supraventricular tachycardia of 200 bpm per EMS on monitor, no strips that resolved with vagal maneuvers in early October 2015. She was placed on diltiazem but felt poorly on this medication and this was stopped. Subsequently, monitoring demonstrated in December 2015 PVC/PACs and sinus tachycardia only. No adverse arrhythmias. We started Toprol-XL 25 mg once a day.  Maybe has 1 min episode of PSVT, Valsalva maneuver still work. She's not having any chest pain. She does have leg cramps, calf cramps at night. She is sad that Dr Amedeo Kinsman is leaving. She did discuss the possibility of starting cholesterol medication. I think this is a good idea. We will go ahead and give her atorvastatin. See below. She is worried about cramping..   Had cardiac catheterization which was unremarkable. Echocardiogram was reassuring. Treadmill test was reassuring. Does not wish to take statins.  After leaving hospital, she had a brief episode of tachycardia arrhythmia which terminated with Valsalva. No syncope, no chest pain.  Vit D was low, diffuse pain.   Has not had any racing. +Hot flash.  Sometimes has sweats. Emotional at times.  Excited about her daughter, she is  a grandmother.  She tried the atorvastatin but stopped after proximally 6 days. Her blood sugar kept increasing and she had worsening cramps she states.  She also had another episode of PSVT transiently when being tired/exhausted.  Wt Readings from Last 3 Encounters:  06/28/15 218 lb 12.8 oz (99.247 kg)  06/09/15 219 lb 6.4 oz (99.519 kg)  05/19/15 223 lb 6.4 oz (101.334 kg)     Past Medical History  Diagnosis Date  . Osteoarthrosis, unspecified whether  generalized or localized, unspecified site   . Raynaud's syndrome   . Urticaria, unspecified   . Other chronic allergic conjunctivitis   . Obesity   . HLD (hyperlipidemia)   . Chest pain     a. s/p normal ett nuclear stress test on 10/16/13  . Complication of anesthesia   . PONV (postoperative nausea and vomiting)   . Dysrhythmia     fast heart rate  . Hypothyroidism   . GERD (gastroesophageal reflux disease)     Past Surgical History  Procedure Laterality Date  . Thyroid nodule    . Knot right breast    . Tonsillectomy    . Vesicovaginal fistula closure w/ tah    . Cholecystectomy    . Oophorectomy      LSO 84-RSO 04  . Left heart catheterization with coronary angiogram N/A 10/22/2013    Procedure: LEFT HEART CATHETERIZATION WITH CORONARY ANGIOGRAM;  Surgeon: [name], MD;  Location: [facility];  Service: Cardiovascular;  Laterality: N/A;  . Esophagogastroduodenoscopy N/A 01/05/2014    Procedure: ESOPHAGOGASTRODUODENOSCOPY (EGD);  Surgeon: [name], MD;  Location: [facility];  Service: Endoscopy;  Laterality: N/A;  . Vaginal hysterectomy  2004    LAV[facility] RSO endometriosis  . Cyst excision Left 11/10/2014    Procedure: EXCISION CYST LEFT INDEX FINGER AND CYST LEFT SMALL FINGER ;  Surgeon: [name], MD;  Location: [facility];  Service: Orthopedics;  Laterality: Left;    Current Outpatient Prescriptions  Medication Sig Dispense  Refill  . ACCU-CHEK AVIVA PLUS test strip 1 each by Other route as needed (check blood sugar).   2  . amitriptyline (ELAVIL) 25 MG tablet Take 25 mg by mouth at bedtime.      . calcium-vitamin D (OSCAL WITH D) 500-200 MG-UNIT tablet Take 1 tablet by mouth daily with breakfast.     . cetirizine (ZYRTEC) 10 MG tablet Take 10 mg by mouth daily.      . fluticasone (FLONASE) 50 MCG/ACT nasal spray Place 2 sprays into both nostrils daily.    Marland Kitchen levothyroxine (SYNTHROID, LEVOTHROID) 100 MCG tablet Take 100 mcg by mouth daily.        . Lutein 20 MG CAPS Take 1 capsule by mouth daily.      . metoprolol succinate (TOPROL-XL) 25 MG 24 hr tablet Take 1 tablet (25 mg total) by mouth daily. 90 tablet 2  . omeprazole (PRILOSEC) 20 MG capsule Take 1 capsule (20 mg total) by mouth 2 (two) times daily before a meal. 60 capsule 0   No current facility-administered medications for this visit.    Allergies:    Allergies  Allergen Reactions  . Hydrocod Polst-Cpm Polst Er Other (See Comments)    hyperactivity  . Pseudoephedrine Palpitations and Other (See Comments)    REACTION: tachycardia  . Sulfonamide Derivatives Anaphylaxis and Swelling  . Azithromycin Itching  . Codeine Itching  . Dilaudid [Hydromorphone Hcl] Itching  . Morphine And Related Itching  . Penicillins Itching  . Shrimp [Shellfish Allergy] Itching  . Atorvastatin     Muscle cramps  And her blood sugar kept going up  . Cephalosporins Swelling    Said to cause facial swelling  . Clarithromycin Itching    REACTION: severe itching  . Lidocaine Other (See Comments)    unknown    Social History:  The patient  reports that she has never smoked. She has never used smokeless tobacco. She reports that she drinks alcohol. She reports that she does not use illicit drugs.   Family History  Problem Relation Age of Onset  . Lung cancer Father   . Heart disease Father   . Heart failure Mother   . Dementia Mother   . Heart disease Maternal Uncle   . Lung cancer Paternal Uncle   . COPD Paternal Uncle     ROS:  Please see the history of present illness.   Denies any syncope, bleeding, orthopnea, PND  All other systems reviewed and negative.   PHYSICAL EXAM: VS:  BP 122/72 mmHg  Pulse 68  Ht 5' 3.5" (1.613 m)  Wt 218 lb 12.8 oz (99.247 kg)  BMI 38.15 kg/m2 Well nourished, well developed, in no acute distress HEENT: normal, [place]/AT, EOMI Neck: no JVD, normal carotid upstroke, no bruit Cardiac:  normal S1, S2; RRR; no murmur Lungs:  clear to auscultation  bilaterally, no wheezing, rhonchi or rales Abd: soft, nontender, no hepatomegaly, no bruits Ext: no edema, 2+ distal pulses overweight Skin: warm and dry GU: deferred Neuro: no focal abnormalities noted, AAO x 3  EKG:  EKG is ordered today. 4/20 04/09/2015-sinus rhythm, 91, right bundle branch block, nonspecific ST-T wave changes, left axis deviation personally viewed.  Event monitor: 12/2013-PACs/PVCs, no adverse arrhythmias. Sinus tachycardia only.  ASSESSMENT AND PLAN:  1. Paroxysmal supraventricular tachycardia-very sensitive to vagal maneuvers. We tried diltiazem but she did not tolerate this well. I then placed her on Toprol-XL 25 mg once a day. She has done very well on this low-dose medication.  Very rarely does she feel any evidence of tachycardia. She knows to take an extra metoprolol if necessary.Next step would be electrophysiology study if necessary once we have a definitive diagnosis. 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 Interestingly, her aunt Rod Holler had syncope with her PSVT. EP at [facility] ablated her and she has had no further incidence. 2. Obesity-encourage weight loss. She is down approximate 10 pounds.  3. Hypertension essential-well controlled. 4. Type 2 diabetes-hemoglobin A1c 7, diet controlled. 5. She is taking vitamin D supplements to hopefully help her with her diffuse aching. 6. Hyperlipidemia-tried atorvastatin 20 mg once a day but she states she stopped shortly after starting because her blood sugar went up and she had cramps, LDL 114 at last check.  This was requested by Dr. Amedeo Kinsman. LDL 154 prior to tx.  7. GERD-PPI.  8. Raynaud's phenomenon-stable. 9. 12 month follow-up  Signed, Candee Furbish, MD [facility]  06/28/2015 1:58 PM

## 2015-07-02 ENCOUNTER — Encounter: Payer: Self-pay | Admitting: *Deleted

## 2015-07-02 DIAGNOSIS — H93291 Other abnormal auditory perceptions, right ear: Secondary | ICD-10-CM | POA: Diagnosis not present

## 2015-07-02 DIAGNOSIS — J329 Chronic sinusitis, unspecified: Secondary | ICD-10-CM | POA: Diagnosis not present

## 2015-07-02 DIAGNOSIS — H6981 Other specified disorders of Eustachian tube, right ear: Secondary | ICD-10-CM | POA: Diagnosis not present

## 2015-07-12 DIAGNOSIS — Z1231 Encounter for screening mammogram for malignant neoplasm of breast: Secondary | ICD-10-CM | POA: Diagnosis not present

## 2015-07-19 ENCOUNTER — Encounter: Payer: Medicare Other | Admitting: Gynecology

## 2015-07-21 ENCOUNTER — Encounter: Payer: Self-pay | Admitting: Gynecology

## 2015-08-02 ENCOUNTER — Encounter: Payer: Self-pay | Admitting: Gynecology

## 2015-08-02 ENCOUNTER — Ambulatory Visit (INDEPENDENT_AMBULATORY_CARE_PROVIDER_SITE_OTHER): Payer: Medicare Other | Admitting: Gynecology

## 2015-08-02 VITALS — BP 120/86 | Ht 63.0 in | Wt 219.0 lb

## 2015-08-02 DIAGNOSIS — Z780 Asymptomatic menopausal state: Secondary | ICD-10-CM

## 2015-08-02 DIAGNOSIS — A499 Bacterial infection, unspecified: Secondary | ICD-10-CM

## 2015-08-02 DIAGNOSIS — N898 Other specified noninflammatory disorders of vagina: Secondary | ICD-10-CM | POA: Diagnosis not present

## 2015-08-02 DIAGNOSIS — M8580 Other specified disorders of bone density and structure, unspecified site: Secondary | ICD-10-CM | POA: Diagnosis not present

## 2015-08-02 DIAGNOSIS — Z01419 Encounter for gynecological examination (general) (routine) without abnormal findings: Secondary | ICD-10-CM

## 2015-08-02 DIAGNOSIS — B373 Candidiasis of vulva and vagina: Secondary | ICD-10-CM | POA: Diagnosis not present

## 2015-08-02 DIAGNOSIS — N760 Acute vaginitis: Secondary | ICD-10-CM | POA: Diagnosis not present

## 2015-08-02 DIAGNOSIS — B9689 Other specified bacterial agents as the cause of diseases classified elsewhere: Secondary | ICD-10-CM

## 2015-08-02 DIAGNOSIS — B3731 Acute candidiasis of vulva and vagina: Secondary | ICD-10-CM

## 2015-08-02 LAB — WET PREP FOR TRICH, YEAST, CLUE
Clue Cells Wet Prep HPF POC: NONE SEEN
Trich, Wet Prep: NONE SEEN

## 2015-08-02 MED ORDER — FLUCONAZOLE 150 MG PO TABS: 150.0000 mg | ORAL_TABLET | Freq: Once | ORAL | 1 refills | Status: AC

## 2015-08-02 MED ORDER — METRONIDAZOLE 500 MG PO TABS: 500.0000 mg | ORAL_TABLET | Freq: Two times a day (BID) | ORAL | 0 refills | Status: DC

## 2015-08-02 NOTE — Progress Notes (Signed)
Jill Dean 1945/10/28 [id number]   History:    70 y.o.  for annual gyn exam who several weeks ago was treated for sinusitis and ear infection and she believes she may have a yeast infection some slight discharge and itching. Patient with past history of abdominal hysterectomy with bilateral salpingo-oophorectomy as a result of endometriosis. She had been in the past on hormone replacement therapy for currently only vaginal moisturizer that she buys over-the-counter. 10/2013 had a heart catheterization that was normal. 12/2011 DEXA T score -1.4 left femoral neck FRAX 14%/1.7%. Type 2 diabetes diet only. Patient states that her PCP has been ordered now her bone density study she had one last year so we do not have the report. Also patient states she had a normal colonoscopy approximate 6 years ago. Her PCP has been doing her blood work.  Past medical history,surgical history, family history and social history were all reviewed and documented in the EPIC chart.  Gynecologic History No LMP recorded. Patient has had a hysterectomy. Contraception: status post hysterectomy Last Pap: 2012. Results were: normal Last mammogram: 2017. Results were: normal  Obstetric History OB History  Gravida Para Term Preterm AB Living  1 1 1     1   SAB TAB Ectopic Multiple Live Births               # Outcome Date GA Lbr Len/2nd Weight Sex Delivery Anes PTL Lv  1 Term                ROS: A ROS was performed and pertinent positives and negatives are included in the history.  GENERAL: No fevers or chills. HEENT: No change in vision, no earache, sore throat or sinus congestion. NECK: No pain or stiffness. CARDIOVASCULAR: No chest pain or pressure. No palpitations. PULMONARY: No shortness of breath, cough or wheeze. GASTROINTESTINAL: No abdominal pain, nausea, vomiting or diarrhea, melena or bright red blood per rectum. GENITOURINARY: No urinary frequency, urgency, hesitancy or dysuria. MUSCULOSKELETAL: No joint  or muscle pain, no back pain, no recent trauma. DERMATOLOGIC: No rash, no itching, no lesions. ENDOCRINE: No polyuria, polydipsia, no heat or cold intolerance. No recent change in weight. HEMATOLOGICAL: No anemia or easy bruising or bleeding. NEUROLOGIC: No headache, seizures, numbness, tingling or weakness. PSYCHIATRIC: No depression, no loss of interest in normal activity or change in sleep pattern.     Exam: chaperone present  BP 120/86   Ht 5\' 3"  (1.6 m)   Wt 219 lb (99.3 kg)   BMI 38.79 kg/m   Body mass index is 38.79 kg/m.  General appearance : Well developed well nourished female. No acute distress HEENT: Eyes: no retinal hemorrhage or exudates,  Neck supple, trachea midline, no carotid bruits, no thyroidmegaly Lungs: Clear to auscultation, no rhonchi or wheezes, or rib retractions  Heart: Regular rate and rhythm, no murmurs or gallops Breast:Examined in sitting and supine position were symmetrical in appearance, no palpable masses or tenderness,  no skin retraction, no nipple inversion, no nipple discharge, no skin discoloration, no axillary or supraclavicular lymphadenopathy Abdomen: no palpable masses or tenderness, no rebound or guarding Extremities: no edema or skin discoloration or tenderness  Pelvic:  Bartholin, Urethra, Skene Glands: Within normal limits             Vagina: Slightly thick white discharge  Cervix: Absent  Uterus absent  Adnexa  Without masses or tenderness  Anus and perineum  normal   Rectovaginal  normal sphincter tone without palpated  masses or tenderness             Hemoccult PCP provides   Wet prep moderate yeast, many bacteria, too numerous to count white blood cell  Assessment/Plan:  70 y.o. female for annual exam using vaginal moisturizer over-the-counter. Patient recently completed antibiotic treatment for sinusitis and ear infection as a result has developed a yeast infection which will be treated with Diflucan 150 mg 1 by mouth today. For  her BV component she will be prescribed Flagyl 500 mg twice a day for 5 days. Pap smear no longer indicated according to new guidelines. Her PCP is been doing her blood work. We are going to obtain a medical release so that we can get a copy of her recent bone density study so we can of gray her records.   Terrance Mass MD, 1:02 PM 08/02/2015

## 2015-08-02 NOTE — Patient Instructions (Signed)
Metronidazole tablets or capsules What is this medicine? METRONIDAZOLE (me troe NI da zole) is an antiinfective. It is used to treat certain kinds of bacterial and protozoal infections. It will not work for colds, flu, or other viral infections. This medicine may be used for other purposes; ask your health care provider or pharmacist if you have questions. What should I tell my health care provider before I take this medicine? They need to know if you have any of these conditions: -anemia or other blood disorders -disease of the nervous system -fungal or yeast infection -if you drink alcohol containing drinks -liver disease -seizures -an unusual or allergic reaction to metronidazole, or other medicines, foods, dyes, or preservatives -pregnant or trying to get pregnant -breast-feeding How should I use this medicine? Take this medicine by mouth with a full glass of water. Follow the directions on the prescription label. Take your medicine at regular intervals. Do not take your medicine more often than directed. Take all of your medicine as directed even if you think you are better. Do not skip doses or stop your medicine early. Talk to your pediatrician regarding the use of this medicine in children. Special care may be needed. Overdosage: If you think you have taken too much of this medicine contact a poison control center or emergency room at once. NOTE: This medicine is only for you. Do not share this medicine with others. What if I miss a dose? If you miss a dose, take it as soon as you can. If it is almost time for your next dose, take only that dose. Do not take double or extra doses. What may interact with this medicine? Do not take this medicine with any of the following medications: -alcohol or any product that contains alcohol -amprenavir oral solution -cisapride -disulfiram -dofetilide -dronedarone -paclitaxel injection -pimozide -ritonavir oral solution -sertraline oral  solution -sulfamethoxazole-trimethoprim injection -thioridazine -ziprasidone This medicine may also interact with the following medications: -birth control pills -cimetidine -lithium -other medicines that prolong the QT interval (cause an abnormal heart rhythm) -phenobarbital -phenytoin -warfarin This list may not describe all possible interactions. Give your health care provider a list of all the medicines, herbs, non-prescription drugs, or dietary supplements you use. Also tell them if you smoke, drink alcohol, or use illegal drugs. Some items may interact with your medicine. What should I watch for while using this medicine? Tell your doctor or health care professional if your symptoms do not improve or if they get worse. You may get drowsy or dizzy. Do not drive, use machinery, or do anything that needs mental alertness until you know how this medicine affects you. Do not stand or sit up quickly, especially if you are an older patient. This reduces the risk of dizzy or fainting spells. Avoid alcoholic drinks while you are taking this medicine and for three days afterward. Alcohol may make you feel dizzy, sick, or flushed. If you are being treated for a sexually transmitted disease, avoid sexual contact until you have finished your treatment. Your sexual partner may also need treatment. What side effects may I notice from receiving this medicine? Side effects that you should report to your doctor or health care professional as soon as possible: -allergic reactions like skin rash or hives, swelling of the face, lips, or tongue -confusion, clumsiness -difficulty speaking -discolored or sore mouth -dizziness -fever, infection -numbness, tingling, pain or weakness in the hands or feet -trouble passing urine or change in the amount of urine -redness, blistering, peeling  or loosening of the skin, including inside the mouth -seizures -unusually weak or tired -vaginal irritation, dryness,  or discharge Side effects that usually do not require medical attention (report to your doctor or health care professional if they continue or are bothersome): -diarrhea -headache -irritability -metallic taste -nausea -stomach pain or cramps -trouble sleeping This list may not describe all possible side effects. Call your doctor for medical advice about side effects. You may report side effects to FDA at 1-800-FDA-1088. Where should I keep my medicine? Keep out of the reach of children. Store at room temperature below 25 degrees C (77 degrees F). Protect from light. Keep container tightly closed. Throw away any unused medicine after the expiration date. NOTE: This sheet is a summary. It may not cover all possible information. If you have questions about this medicine, talk to your doctor, pharmacist, or health care provider.    2016, Elsevier/Gold Standard. (2012-07-26 14:08:39) Fluconazole tablets What is this medicine? FLUCONAZOLE (floo KON na zole) is an antifungal medicine. It is used to treat certain kinds of fungal or yeast infections. This medicine may be used for other purposes; ask your health care provider or pharmacist if you have questions. What should I tell my health care provider before I take this medicine? They need to know if you have any of these conditions: -electrolyte abnormalities -history of irregular heart beat -kidney disease -an unusual or allergic reaction to fluconazole, other azole antifungals, medicines, foods, dyes, or preservatives -pregnant or trying to get pregnant -breast-feeding How should I use this medicine? Take this medicine by mouth. Follow the directions on the prescription label. Do not take your medicine more often than directed. Talk to your pediatrician regarding the use of this medicine in children. Special care may be needed. This medicine has been used in children as young as 62 months of age. Overdosage: If you think you have taken too  much of this medicine contact a poison control center or emergency room at once. NOTE: This medicine is only for you. Do not share this medicine with others. What if I miss a dose? If you miss a dose, take it as soon as you can. If it is almost time for your next dose, take only that dose. Do not take double or extra doses. What may interact with this medicine? Do not take this medicine with any of the following medications: -astemizole -certain medicines for irregular heart beat like dofetilide, dronedarone, quinidine -cisapride -erythromycin -lomitapide -other medicines that prolong the QT interval (cause an abnormal heart rhythm) -pimozide -terfenadine -thioridazine -tolvaptan -ziprasidone This medicine may also interact with the following medications: -antiviral medicines for HIV or AIDS -birth control pills -certain antibiotics like rifabutin, rifampin -certain medicines for blood pressure like amlodipine, isradipine, felodipine, hydrochlorothiazide, losartan, nifedipine -certain medicines for cancer like cyclophosphamide, vinblastine, vincristine -certain medicines for cholesterol like atorvastatin, lovastatin, fluvastatin, simvastatin -certain medicines for depression, anxiety, or psychotic disturbances like amitriptyline, midazolam, nortriptyline, triazolam -certain medicines for diabetes like glipizide, glyburide, tolbutamide -certain medicines for pain like alfentanil, fentanyl, methadone -certain medicines for seizures like carbamazepine, phenytoin -certain medicines that treat or prevent blood clots like warfarin -halofantrine -medicines that lower your chance of fighting infection like cyclosporine, prednisone, tacrolimus -NSAIDS, medicines for pain and inflammation, like celecoxib, diclofenac, flurbiprofen, ibuprofen, meloxicam, naproxen -other medicines for fungal infections -sirolimus -theophylline -tofacitinib This list may not describe all possible interactions.  Give your health care provider a list of all the medicines, herbs, non-prescription drugs, or dietary supplements you  use. Also tell them if you smoke, drink alcohol, or use illegal drugs. Some items may interact with your medicine. What should I watch for while using this medicine? Visit your doctor or health care professional for regular checkups. If you are taking this medicine for a long time you may need blood work. Tell your doctor if your symptoms do not improve. Some fungal infections need many weeks or months of treatment to cure. Alcohol can increase possible damage to your liver. Avoid alcoholic drinks. If you have a vaginal infection, do not have sex until you have finished your treatment. You can wear a sanitary napkin. Do not use tampons. Wear freshly washed cotton, not synthetic, panties. What side effects may I notice from receiving this medicine? Side effects that you should report to your doctor or health care professional as soon as possible: -allergic reactions like skin rash or itching, hives, swelling of the lips, mouth, tongue, or throat -dark urine -feeling dizzy or faint -irregular heartbeat or chest pain -redness, blistering, peeling or loosening of the skin, including inside the mouth -trouble breathing -unusual bruising or bleeding -vomiting -yellowing of the eyes or skin Side effects that usually do not require medical attention (report to your doctor or health care professional if they continue or are bothersome): -changes in how food tastes -diarrhea -headache -stomach upset or nausea This list may not describe all possible side effects. Call your doctor for medical advice about side effects. You may report side effects to FDA at 1-800-FDA-1088. Where should I keep my medicine? Keep out of the reach of children. Store at room temperature below 30 degrees C (86 degrees F). Throw away any medicine after the expiration date. NOTE: This sheet is a summary. It may  not cover all possible information. If you have questions about this medicine, talk to your doctor, pharmacist, or health care provider.    2016, Elsevier/Gold Standard. (2012-07-27 16:13:04) Monilial Vaginitis Vaginitis in a soreness, swelling and redness (inflammation) of the vagina and vulva. Monilial vaginitis is not a sexually transmitted infection. CAUSES  Yeast vaginitis is caused by yeast (candida) that is normally found in your vagina. With a yeast infection, the candida has overgrown in number to a point that upsets the chemical balance. SYMPTOMS   White, thick vaginal discharge.  Swelling, itching, redness and irritation of the vagina and possibly the lips of the vagina (vulva).  Burning or painful urination.  Painful intercourse. DIAGNOSIS  Things that may contribute to monilial vaginitis are:  Postmenopausal and virginal states.  Pregnancy.  Infections.  Being tired, sick or stressed, especially if you had monilial vaginitis in the past.  Diabetes. Good control will help lower the chance.  Birth control pills.  Tight fitting garments.  Using bubble bath, feminine sprays, douches or deodorant tampons.  Taking certain medications that kill germs (antibiotics).  Sporadic recurrence can occur if you become ill. TREATMENT  Your caregiver will give you medication.  There are several kinds of anti monilial vaginal creams and suppositories specific for monilial vaginitis. For recurrent yeast infections, use a suppository or cream in the vagina 2 times a week, or as directed.  Anti-monilial or steroid cream for the itching or irritation of the vulva may also be used. Get your caregiver's permission.  Painting the vagina with methylene blue solution may help if the monilial cream does not work.  Eating yogurt may help prevent monilial vaginitis. HOME CARE INSTRUCTIONS   Finish all medication as prescribed.  Do not have sex  until treatment is completed or after  your caregiver tells you it is okay.  Take warm sitz baths.  Do not douche.  Do not use tampons, especially scented ones.  Wear cotton underwear.  Avoid tight pants and panty hose.  Tell your sexual partner that you have a yeast infection. They should go to their caregiver if they have symptoms such as mild rash or itching.  Your sexual partner should be treated as well if your infection is difficult to eliminate.  Practice safer sex. Use condoms.  Some vaginal medications cause latex condoms to fail. Vaginal medications that harm condoms are:  Cleocin cream.  Butoconazole (Femstat).  Terconazole (Terazol) vaginal suppository.  Miconazole (Monistat) (may be purchased over the counter). SEEK MEDICAL CARE IF:   You have a temperature by mouth above 102 F (38.9 C).  The infection is getting worse after 2 days of treatment.  The infection is not getting better after 3 days of treatment.  You develop blisters in or around your vagina.  You develop vaginal bleeding, and it is not your menstrual period.  You have pain when you urinate.  You develop intestinal problems.  You have pain with sexual intercourse.   This information is not intended to replace advice given to you by your health care provider. Make sure you discuss any questions you have with your health care provider.   Document Released: 09/28/2004 Document Revised: 03/13/2011 Document Reviewed: 06/22/2014 Elsevier Interactive Patient Education [employer].

## 2015-08-24 DIAGNOSIS — H1013 Acute atopic conjunctivitis, bilateral: Secondary | ICD-10-CM | POA: Diagnosis not present

## 2015-10-11 DIAGNOSIS — E119 Type 2 diabetes mellitus without complications: Secondary | ICD-10-CM | POA: Diagnosis not present

## 2015-11-08 DIAGNOSIS — E785 Hyperlipidemia, unspecified: Secondary | ICD-10-CM | POA: Diagnosis not present

## 2015-11-08 DIAGNOSIS — K219 Gastro-esophageal reflux disease without esophagitis: Secondary | ICD-10-CM | POA: Diagnosis not present

## 2015-11-08 DIAGNOSIS — Z1389 Encounter for screening for other disorder: Secondary | ICD-10-CM | POA: Diagnosis not present

## 2015-11-08 DIAGNOSIS — I471 Supraventricular tachycardia: Secondary | ICD-10-CM | POA: Diagnosis not present

## 2015-11-08 DIAGNOSIS — Z0000 Encounter for general adult medical examination without abnormal findings: Secondary | ICD-10-CM | POA: Diagnosis not present

## 2015-11-08 DIAGNOSIS — E041 Nontoxic single thyroid nodule: Secondary | ICD-10-CM | POA: Diagnosis not present

## 2015-11-08 DIAGNOSIS — E119 Type 2 diabetes mellitus without complications: Secondary | ICD-10-CM | POA: Diagnosis not present

## 2015-11-08 DIAGNOSIS — Z1239 Encounter for other screening for malignant neoplasm of breast: Secondary | ICD-10-CM | POA: Diagnosis not present

## 2015-11-08 DIAGNOSIS — M810 Age-related osteoporosis without current pathological fracture: Secondary | ICD-10-CM | POA: Diagnosis not present

## 2015-11-08 DIAGNOSIS — E559 Vitamin D deficiency, unspecified: Secondary | ICD-10-CM | POA: Diagnosis not present

## 2015-11-08 DIAGNOSIS — Z1211 Encounter for screening for malignant neoplasm of colon: Secondary | ICD-10-CM | POA: Diagnosis not present

## 2015-11-08 DIAGNOSIS — E039 Hypothyroidism, unspecified: Secondary | ICD-10-CM | POA: Diagnosis not present

## 2015-11-15 DIAGNOSIS — B079 Viral wart, unspecified: Secondary | ICD-10-CM | POA: Diagnosis not present

## 2015-11-15 DIAGNOSIS — L814 Other melanin hyperpigmentation: Secondary | ICD-10-CM | POA: Diagnosis not present

## 2015-11-15 DIAGNOSIS — D225 Melanocytic nevi of trunk: Secondary | ICD-10-CM | POA: Diagnosis not present

## 2015-11-15 DIAGNOSIS — L821 Other seborrheic keratosis: Secondary | ICD-10-CM | POA: Diagnosis not present

## 2015-11-15 DIAGNOSIS — D1801 Hemangioma of skin and subcutaneous tissue: Secondary | ICD-10-CM | POA: Diagnosis not present

## 2015-11-22 DIAGNOSIS — J0101 Acute recurrent maxillary sinusitis: Secondary | ICD-10-CM | POA: Diagnosis not present

## 2015-11-22 DIAGNOSIS — R42 Dizziness and giddiness: Secondary | ICD-10-CM | POA: Diagnosis not present

## 2015-11-22 DIAGNOSIS — H6981 Other specified disorders of Eustachian tube, right ear: Secondary | ICD-10-CM | POA: Diagnosis not present

## 2015-11-26 ENCOUNTER — Other Ambulatory Visit: Payer: Self-pay | Admitting: Cardiology

## 2015-11-27 ENCOUNTER — Other Ambulatory Visit: Payer: Self-pay | Admitting: Cardiology

## 2015-11-29 ENCOUNTER — Other Ambulatory Visit: Payer: Self-pay | Admitting: *Deleted

## 2015-11-29 MED ORDER — METOPROLOL SUCCINATE ER 25 MG PO TB24: 25.0000 mg | ORAL_TABLET | Freq: Every day | ORAL | 3 refills | Status: DC

## 2015-12-08 ENCOUNTER — Telehealth: Payer: Self-pay | Admitting: Internal Medicine

## 2015-12-08 MED ORDER — AZELASTINE HCL 0.1 % NA SOLN: 2.0000 | Freq: Two times a day (BID) | NASAL | 0 refills | Status: DC | PRN

## 2015-12-08 NOTE — Telephone Encounter (Signed)
Yes- Ok to use Mucinex

## 2015-12-08 NOTE — Telephone Encounter (Signed)
Spoke with pt. She is aware of CY's recommendations. States that she can't take Codeine. Wants to know if she take regular Mucinex OTC. Rx has been sent in for Astelin.  CY - please advise. Thanks.

## 2015-12-08 NOTE — Telephone Encounter (Signed)
Spoke with notified of recs per CDY  She verbalized understanding and nothing further needed

## 2015-12-08 NOTE — Telephone Encounter (Signed)
Offer Rx  Astelin nasal spray    1-2 puffs each nostril twice daily as needed                 prometh codeine cough syrup (if she can handle codeine)    120 ml,     5 ml every 6 h prn cough  No ref

## 2015-12-08 NOTE — Telephone Encounter (Signed)
CY  Please Advise-Sick Message  Pt. Called c/o of nasal drainage running down to her throat  and causing her to cough. She is coughing with clear mucus. Denies fever,chills,sob. States she tried mucinex dm to stop the drainage says she did not handle it well. Wanted to know if you could prescribe something.

## 2016-03-20 DIAGNOSIS — E785 Hyperlipidemia, unspecified: Secondary | ICD-10-CM | POA: Diagnosis not present

## 2016-03-20 DIAGNOSIS — E669 Obesity, unspecified: Secondary | ICD-10-CM | POA: Diagnosis not present

## 2016-03-20 DIAGNOSIS — Z6839 Body mass index (BMI) 39.0-39.9, adult: Secondary | ICD-10-CM | POA: Diagnosis not present

## 2016-03-20 DIAGNOSIS — E119 Type 2 diabetes mellitus without complications: Secondary | ICD-10-CM | POA: Diagnosis not present

## 2016-03-20 DIAGNOSIS — E039 Hypothyroidism, unspecified: Secondary | ICD-10-CM | POA: Diagnosis not present

## 2016-04-17 DIAGNOSIS — Z6839 Body mass index (BMI) 39.0-39.9, adult: Secondary | ICD-10-CM | POA: Diagnosis not present

## 2016-04-17 DIAGNOSIS — E119 Type 2 diabetes mellitus without complications: Secondary | ICD-10-CM | POA: Diagnosis not present

## 2016-04-17 DIAGNOSIS — E1165 Type 2 diabetes mellitus with hyperglycemia: Secondary | ICD-10-CM | POA: Diagnosis not present

## 2016-04-17 DIAGNOSIS — E785 Hyperlipidemia, unspecified: Secondary | ICD-10-CM | POA: Diagnosis not present

## 2016-05-17 ENCOUNTER — Encounter: Payer: Self-pay | Admitting: Gynecology

## 2016-05-25 ENCOUNTER — Telehealth: Payer: Self-pay | Admitting: Internal Medicine

## 2016-05-25 MED ORDER — PREDNISONE 10 MG PO TABS: ORAL_TABLET | ORAL | 0 refills | Status: DC

## 2016-05-25 MED ORDER — AZELASTINE HCL 0.1 % NA SOLN: 2.0000 | Freq: Two times a day (BID) | NASAL | 0 refills | Status: DC | PRN

## 2016-05-25 NOTE — Telephone Encounter (Signed)
Spoke with patient regarding CY's recs. Patient verbalized understanding. Medication has been called into her preferred pharmacy. Nothing else needed at time of call.

## 2016-05-25 NOTE — Telephone Encounter (Signed)
Offer prednisone 10 mg, # 20, 4 X 2 DAYS, 3 X 2 DAYS, 2 X 2 DAYS, 1 X 2 DAYS  

## 2016-05-25 NOTE — Telephone Encounter (Signed)
Pt c/o prod cough with clear mucus, PND, sinus congestion X1 day. Denies fever, chest pain, discolored mucus. Pt has been taking zyrtec and flonase to help with symptoms.  States normally she gets a pred taper and a depo medrol injection. Offered appt with TP today and MW tomorrow as [facility] is completely booked up, declined appts.  Requesting further recs.  Uses [facility] in [place].    Last ov: 06/04/15 Next ov: none  CY please advise. Thanks!   Allergies  Allergen Reactions  . Hydrocod Polst-Cpm Polst Er Other (See Comments)    hyperactivity  . Pseudoephedrine Palpitations and Other (See Comments)    REACTION: tachycardia  . Sulfonamide Derivatives Anaphylaxis and Swelling  . Azithromycin Itching  . Codeine Itching  . Dilaudid [Hydromorphone Hcl] Itching  . Morphine And Related Itching  . Penicillins Itching  . Shrimp [Shellfish Allergy] Itching  . Atorvastatin     Muscle cramps  And her blood sugar kept going up  . Cephalosporins Swelling    Said to cause facial swelling  . Clarithromycin Itching    REACTION: severe itching  . Lidocaine Other (See Comments)    unknown

## 2016-05-25 NOTE — Addendum Note (Signed)
Addended by: Valerie Salts on: 05/25/2016 10:06 AM   Modules accepted: Orders

## 2016-05-26 ENCOUNTER — Telehealth: Payer: Self-pay | Admitting: Internal Medicine

## 2016-05-26 MED ORDER — DOXYCYCLINE HYCLATE 100 MG PO TABS: ORAL_TABLET | ORAL | 0 refills | Status: DC

## 2016-05-26 NOTE — Telephone Encounter (Signed)
Called and spoke with pt and she is aware of appt with CY on June 18 at 11:30.

## 2016-05-26 NOTE — Telephone Encounter (Signed)
Per CY-pt can be fit in within the next 2 months(RNA slot fine). Thanks.

## 2016-05-26 NOTE — Telephone Encounter (Signed)
Rx has been sent to preferred pharmacy. Pt is aware and voiced her understanding. Pt states she did not receive a recall for her yearly follow up. Pt is due for f/u, as her last OV was 06/09/15. It does not appear that a recall was placed.  CY or Katie please advise where pt can be scheduled. Thanks.

## 2016-05-26 NOTE — Telephone Encounter (Signed)
Spoke with pt, who states CY prescribed Astelin and prednisone yesterday. Pt states she has not yet started the prednisone, as she wanted try the nasal spray first. Pt states prior to starting Astelin her nasal drainage was clear in color, nasal drainage in now yellow to green. Pt is requesting Rx for doxycycline, as she would like to avoid taking prednisone. Pt states cough has subsided.   CY please advise. Thanks.   Current Outpatient Prescriptions on File Prior to Visit  Medication Sig Dispense Refill  . ACCU-CHEK AVIVA PLUS test strip 1 each by Other route as needed (check blood sugar).   2  . amitriptyline (ELAVIL) 25 MG tablet Take 25 mg by mouth at bedtime.      Marland Kitchen azelastine (ASTELIN) 0.1 % nasal spray Place 2 sprays into both nostrils 2 (two) times daily as needed for rhinitis. Use in each nostril as directed 30 mL 0  . calcium-vitamin D (OSCAL WITH D) 500-200 MG-UNIT tablet Take 1 tablet by mouth daily with breakfast.     . cetirizine (ZYRTEC) 10 MG tablet Take 10 mg by mouth daily.      . fluticasone (FLONASE) 50 MCG/ACT nasal spray Place 2 sprays into both nostrils daily.    Marland Kitchen levothyroxine (SYNTHROID, LEVOTHROID) 100 MCG tablet Take 100 mcg by mouth daily.      . Lutein 20 MG CAPS Take 1 capsule by mouth daily.      . metoprolol succinate (TOPROL-XL) 25 MG 24 hr tablet Take 1 tablet (25 mg total) by mouth daily. 90 tablet 3  . metroNIDAZOLE (FLAGYL) 500 MG tablet Take 1 tablet (500 mg total) by mouth 2 (two) times daily. 10 tablet 0  . omeprazole (PRILOSEC) 20 MG capsule Take 1 capsule (20 mg total) by mouth 2 (two) times daily before a meal. 60 capsule 0  . predniSONE (DELTASONE) 10 MG tablet Take 4 tabs for 2 days, then 3 tabs for 2 days, 2 tabs for 2 days, then 1 tab for 2 days, then stop. 20 tablet 0   No current facility-administered medications on file prior to visit.     Allergies  Allergen Reactions  . Hydrocod Polst-Cpm Polst Er Other (See Comments)    hyperactivity  .  Pseudoephedrine Palpitations and Other (See Comments)    REACTION: tachycardia  . Sulfonamide Derivatives Anaphylaxis and Swelling  . Azithromycin Itching  . Codeine Itching  . Dilaudid [Hydromorphone Hcl] Itching  . Morphine And Related Itching  . Penicillins Itching  . Shrimp [Shellfish Allergy] Itching  . Atorvastatin     Muscle cramps  And her blood sugar kept going up  . Cephalosporins Swelling    Said to cause facial swelling  . Clarithromycin Itching    REACTION: severe itching  . Lidocaine Other (See Comments)    unknown

## 2016-05-26 NOTE — Telephone Encounter (Signed)
Ok doxycycline 100 mg, # 8, 2 today then one daily

## 2016-06-06 ENCOUNTER — Encounter: Payer: Self-pay | Admitting: Cardiology

## 2016-06-19 ENCOUNTER — Encounter: Payer: Self-pay | Admitting: Internal Medicine

## 2016-06-19 ENCOUNTER — Ambulatory Visit (INDEPENDENT_AMBULATORY_CARE_PROVIDER_SITE_OTHER): Payer: Medicare Other | Admitting: Internal Medicine

## 2016-06-19 VITALS — BP 110/70 | HR 85 | Ht 63.0 in | Wt 216.8 lb

## 2016-06-19 DIAGNOSIS — J0100 Acute maxillary sinusitis, unspecified: Secondary | ICD-10-CM | POA: Diagnosis not present

## 2016-06-19 DIAGNOSIS — J0181 Other acute recurrent sinusitis: Secondary | ICD-10-CM | POA: Diagnosis not present

## 2016-06-19 MED ORDER — DOXYCYCLINE HYCLATE 100 MG PO TABS: ORAL_TABLET | ORAL | 0 refills | Status: DC

## 2016-06-19 MED ORDER — PHENYLEPHRINE HCL 1 % NA SOLN
3.0000 [drp] | Freq: Four times a day (QID) | NASAL | Status: DC | PRN
  Administered 2016-06-19: 3 [drp] via NASAL

## 2016-06-19 MED ORDER — METHYLPREDNISOLONE ACETATE 80 MG/ML IJ SUSP
80.0000 mg | Freq: Once | INTRAMUSCULAR | Status: AC
  Administered 2016-06-19: 80 mg via INTRAMUSCULAR

## 2016-06-19 NOTE — Patient Instructions (Signed)
Neb neo nasal      Dx   Acute maxillary  sinusitis  Depo 80      Script sent for doxycycline  Script printed refilling Astelin nasal spray  Ok to continue the sinus rinse

## 2016-06-19 NOTE — Progress Notes (Signed)
    Patient ID: Jill Dean, female    DOB: [birth year], 71 y.o.   MRN: [id number]  HPI female never smoker followed for allergic rhinitis/conjunctivitis, complicated by history Raynaud's syndrome, GERD, urticaria, DM, PSVT  ---------------------------------------------------------------------------------------------------  06/09/2015-71 year old female never smoker followed for allergic rhinitis/conjunctivitis, complicated by history Raynaud's syndrome, GERD, urticaria, DM, PSVT We had tried Nasalcrom, doxycycline, Claritin/Flonase FOLLOWS FOR: Pt states she is not feeling better since last visit 05-19-15. Continues to have issues with ears and feeling full. She has been complaining about head and ear pressure/congestion with some drainage intermittently since winter. Felt better as she took 14 days of doxycycline but now right ear feels full again. Ear does not pop. She can't take decongestants because of her SVT. She wanted intervention before she goes to the beach this week  06/19/16- 72 year old female never smoker followed for allergic rhinitis/conjunctivitis, complicated by history Raynaud's syndrome, GERD, urticaria, DM, PSVT Allergic Rhinitis: Pt here today still having alot of nasal drainage, she has noticed the mucus is a yellow color, the drainage is going down her throat, she is coughing She states her breathing is doing ok, Denies wheezing, Per pt she did not start the prednisone  Doxycycline seemed to be helping with didn't last long enough. Astelin nasal spray definitely stops postnasal drip and she asks refill. Chose not to take prednisone. While on Astelin for sore throats resolve suggesting postnasal drainage cause that. She is not noticing reflux. Saw Dr. Johnnette Gourd and reports he said he couldn't help her. She continues sinus rinse with Neti pot.  Review of Systems-See HPI Constitutional:   No-   weight loss, night sweats, fevers, chills, fatigue, lassitude. HEENT:    headaches,  difficulty swallowing, tooth/dental problems, +sore throat,       sneezing, itching, ear ache, +nasal congestion, +post nasal drip,  CV:  No-   chest pain, orthopnea, PND, swelling in lower extremities, anasarca,  dizziness, palpitations Resp: No-   shortness of breath with exertion or at rest.            +-productive cough,  No non-productive cough,  No-  coughing up of blood.              -change in color of mucus.  No- wheezing.    Objective:   Physical Exam General- Alert, Oriented, Affect-appropriate, Distress- none acute  Overweight Skin- rash-none, lesions- none, excoriation- none Lymphadenopathy- none Head- atraumatic            Eyes- Gross vision intact, PERRLA, conjunctivae clear secretions, +periorbital edema            Ears- Hearing,             Nose- +mucus, , No-Septal dev,  polyps, erosion, perforation,             Throat- Mallampati III-IV , mucosa clear-not red , drainage- none, tonsils- atrophic.  Neck- flexible , trachea midline, no stridor , thyroid nl, carotid no bruit Chest -            Lung- clear to P&A but diminished, wheeze- none, cough- none , dullness-none, rub- none           Chest wall-  Abd-  Br/ Gen/ Rectal- Not done, not indicated Extrem- cyanosis- none, clubbing, none, atrophy- none, strength- nl Neuro- grossly intact to observation

## 2016-06-19 NOTE — Assessment & Plan Note (Signed)
Initial improvement with doxycycline suggests an acute maxillary sinusitis. Plan-nasal nebulizer treatment, Depo-Medrol, extended doxycycline. Continue sinus rinse with saline.

## 2016-06-21 ENCOUNTER — Other Ambulatory Visit: Payer: Self-pay | Admitting: Internal Medicine

## 2016-06-26 ENCOUNTER — Encounter: Payer: Self-pay | Admitting: Cardiology

## 2016-06-26 ENCOUNTER — Ambulatory Visit (INDEPENDENT_AMBULATORY_CARE_PROVIDER_SITE_OTHER): Payer: Medicare Other | Admitting: Cardiology

## 2016-06-26 ENCOUNTER — Ambulatory Visit: Payer: Medicare Other | Admitting: Cardiology

## 2016-06-26 VITALS — BP 130/70 | HR 88 | Ht 63.5 in | Wt 217.8 lb

## 2016-06-26 DIAGNOSIS — I471 Supraventricular tachycardia: Secondary | ICD-10-CM

## 2016-06-26 DIAGNOSIS — E1169 Type 2 diabetes mellitus with other specified complication: Secondary | ICD-10-CM

## 2016-06-26 DIAGNOSIS — E785 Hyperlipidemia, unspecified: Secondary | ICD-10-CM

## 2016-06-26 DIAGNOSIS — K219 Gastro-esophageal reflux disease without esophagitis: Secondary | ICD-10-CM | POA: Diagnosis not present

## 2016-06-26 DIAGNOSIS — E119 Type 2 diabetes mellitus without complications: Secondary | ICD-10-CM | POA: Diagnosis not present

## 2016-06-26 DIAGNOSIS — E669 Obesity, unspecified: Secondary | ICD-10-CM | POA: Diagnosis not present

## 2016-06-26 DIAGNOSIS — IMO0001 Reserved for inherently not codable concepts without codable children: Secondary | ICD-10-CM

## 2016-06-26 NOTE — Progress Notes (Signed)
CARDIOLOGY OFFICE NOTE  Date:  06/26/2016    Jill Dean Date of Birth: [birth year] Medical Record #[id number]  PCP:  Leeroy Cha, MD  Cardiologist:  Servando Snare & Dr. Marlou Porch  Chief Complaint  Patient presents with  . Follow-up    SVT    History of Present Illness: Jill Dean is a 71 y.o. female who presents today for 1 year follow-up. The patient has a past medical history significant for Raynaud syndrome, obesity, hypothyroidism, hyperlipidemia, and GERD. She comes in today alone.   Patient has a history of presumed supraventricular tachycardia above 200 bpm per EMS with no strips as evidence in 10/2013 that resolved with vagal maneuver. She was then tried on diltiazem therapy which she did not tolerate and she was then switched to Toprol XL 25 mg daily, with an extra dose as needed for elevated heart rate which was working well for her. Monitoring in December 2015 demonstrated only PVCs/PACs and sinus tachycardia, no adverse arrhythmias. A cardiac Catheterization on 10/22/13 showed normal coronary arteries and normal LV systolic function by noninvasive testing and mildly elevated LVEDP. An echocardiogram on 10/22/13 showed mild LVH, EF 60%, normal wall motion.  Occasionally she feels a fast heart beat, once a month or less. She bears down and it resolves, only lasting a few seconds. She is tolerating the Toprol-XL without problems. She denies any lightheadedness, dizziness, near syncope or syncope. She has no chest pain/tightness or dyspnea on exertion. She reports being active as she push mows her yard, does weed eating and walks frequently. She cares for her 59 month old grand child. She does not do regular organized exercise.  She has been initiated on statins in the past with most recently starting simvastatin 20 mg daily which she was unable to tolerate due to dizziness, fatigue, and depression. This is being followed by her PCP and the patient reports plans to repeat  lipid panel in July. The patient also reports a hemoglobin A1c of 8.0 in 04/2016. She has continued to be diet controlled. Her PCP is working with her on this. She is actively trying to lose weight with diet and exercise and has lost 9 pounds since April 2018.   Past Medical History:  Diagnosis Date  . Chest pain    a. s/p normal ett nuclear stress test on 10/16/13  . Complication of anesthesia   . Dysrhythmia    fast heart rate  . GERD (gastroesophageal reflux disease)   . HLD (hyperlipidemia)   . Hypothyroidism   . Obesity   . Osteoarthrosis, unspecified whether generalized or localized, unspecified site   . Other chronic allergic conjunctivitis   . PONV (postoperative nausea and vomiting)   . Raynaud's syndrome   . Urticaria, unspecified     Past Surgical History:  Procedure Laterality Date  . CHOLECYSTECTOMY    . CYST EXCISION Left 11/10/2014   Procedure: EXCISION CYST LEFT INDEX FINGER AND CYST LEFT SMALL FINGER ;  Surgeon: [name], MD;  Location: [facility];  Service: Orthopedics;  Laterality: Left;  . ESOPHAGOGASTRODUODENOSCOPY N/A 01/05/2014   Procedure: ESOPHAGOGASTRODUODENOSCOPY (EGD);  Surgeon: [name], MD;  Location: [facility];  Service: Endoscopy;  Laterality: N/A;  . knot right breast    . LEFT HEART CATHETERIZATION WITH CORONARY ANGIOGRAM N/A 10/22/2013   Procedure: LEFT HEART CATHETERIZATION WITH CORONARY ANGIOGRAM;  Surgeon: [name], MD;  Location: [facility];  Service: Cardiovascular;  Laterality: N/A;  . OOPHORECTOMY  LSO 84-RSO 04  . thyroid nodule    . TONSILLECTOMY    . VAGINAL HYSTERECTOMY  2004   LAVH RSO endometriosis  . VESICOVAGINAL FISTULA CLOSURE W/ TAH       Medications: Current Meds  Medication Sig  . ACCU-CHEK AVIVA PLUS test strip 1 each by Other route as needed (check blood sugar).   Marland Kitchen amitriptyline (ELAVIL) 25 MG tablet Take 25 mg by mouth at bedtime.    Marland Kitchen azelastine (ASTELIN) 0.1 % nasal spray  SPRAY 2 SPRAYS TWICE A DAY  . calcium-vitamin D (OSCAL WITH D) 500-200 MG-UNIT tablet Take 1 tablet by mouth daily with breakfast.   . cetirizine (ZYRTEC) 10 MG tablet Take 10 mg by mouth daily.    Marland Kitchen doxycycline (VIBRA-TABS) 100 MG tablet 1 tab twice daily  . fluticasone (FLONASE) 50 MCG/ACT nasal spray Place 2 sprays into both nostrils daily.  Marland Kitchen levothyroxine (SYNTHROID, LEVOTHROID) 100 MCG tablet Take 100 mcg by mouth daily.    . Lutein 20 MG CAPS Take 1 capsule by mouth daily.    . metoprolol succinate (TOPROL-XL) 25 MG 24 hr tablet Take 1 tablet (25 mg total) by mouth daily.  Marland Kitchen omeprazole (PRILOSEC) 20 MG capsule Take 1 capsule (20 mg total) by mouth 2 (two) times daily before a meal.   Current Facility-Administered Medications for the 06/26/16 encounter (Office Visit) with Daune Perch, NP  Medication  . phenylephrine (NEO-SYNEPHRINE) 1 % nasal drops 3 drop     Allergies: Allergies  Allergen Reactions  . Hydrocod Polst-Cpm Polst Er Other (See Comments)    hyperactivity  . Pseudoephedrine Palpitations and Other (See Comments)    REACTION: tachycardia  . Sulfonamide Derivatives Anaphylaxis and Swelling  . Azithromycin Itching  . Codeine Itching  . Dilaudid [Hydromorphone Hcl] Itching  . Morphine And Related Itching  . Penicillins Itching  . Shrimp [Shellfish Allergy] Itching  . Simvastatin Other (See Comments)    Fatigue, depression and dizziness at 20 mg  . Atorvastatin     Muscle cramps  And her blood sugar kept going up  . Cephalosporins Swelling    Said to cause facial swelling  . Clarithromycin Itching    REACTION: severe itching  . Lidocaine Other (See Comments)    unknown    Social History: The patient  reports that she has never smoked. She has never used smokeless tobacco. She reports that she drinks alcohol. She reports that she does not use drugs.   Family History: The patient's family history includes COPD in her paternal uncle; Dementia in her mother;  Heart disease in her father and maternal uncle; Heart failure in her mother; Lung cancer in her father and paternal uncle.   Review of Systems: Please see the history of present illness.   Otherwise, the review of systems is positive for none.   All other systems are reviewed and negative.   Physical Exam: VS:  BP 130/70 (BP Location: Right Arm, Patient Position: Sitting, Cuff Size: Normal)   Pulse 88   Ht 5' 3.5" (1.613 m)   Wt 217 lb 12.8 oz (98.8 kg)   BMI 37.98 kg/m  .  BMI Body mass index is 37.98 kg/m.  Wt Readings from Last 3 Encounters:  06/26/16 217 lb 12.8 oz (98.8 kg)  06/19/16 216 lb 12.8 oz (98.3 kg)  08/02/15 219 lb (99.3 kg)    General: Pleasant. Well developed, well nourished and in no acute distress.   HEENT: Normal.  Neck: Supple, no JVD, carotid  bruits, or masses noted.  Cardiac: Regular rate and rhythm. No murmurs, rubs, or gallops. No edema.  Respiratory:  Lungs are clear to auscultation bilaterally with normal work of breathing.  GI: Soft and nontender.  MS: No deformity or atrophy. Gait and ROM intact.  Skin: Warm and dry. Color is normal.  Neuro:  Strength and sensation are intact and no gross focal deficits noted.  Psych: Alert, appropriate and with normal affect.   LABORATORY DATA:  EKG:  EKG is ordered today. This demonstrates Normal sinus rhythm at 88 bpm with right bundle branch block and left anterior fascicular block, unchanged from previous. QTC 488.  Lab Results  Component Value Date   WBC 8.5 12/13/2013   HGB 13.1 12/13/2013   HCT 40.1 12/13/2013   PLT 250 12/13/2013   GLUCOSE 105 (H) 12/13/2013   CHOL 223 (H) 06/21/2015   TRIG 249 (H) 06/21/2015   HDL 59 06/21/2015   LDLCALC 114 06/21/2015   ALT 15 06/21/2015   NA 142 12/13/2013   K 4.5 12/13/2013   CL 100 12/13/2013   CREATININE 0.63 12/13/2013   BUN 14 12/13/2013   CO2 27 12/13/2013   TSH 5.240 (H) 10/16/2013   INR 1.00 10/22/2013   HGBA1C 7.0 (H) 10/16/2013     BNP  (last 3 results) No results for input(s): BNP in the last 8760 hours.  ProBNP (last 3 results) No results for input(s): PROBNP in the last 8760 hours.   Other Studies Reviewed Today: Echo 10/02/13 Study Conclusions  - Left ventricle: The cavity size was normal. Wall thickness was increased in a pattern of mild LVH. The estimated ejection fraction was 60%. Wall motion was normal; there were no regional wall motion abnormalities. - Right ventricle: The cavity size was normal. Systolic function was normal.  Assessment/Plan:  History of SVT -Patient had SVT in 10/2013 that resolved with vagal maneuver, currently maintained on Toprol-XL 25 mg daily -Normal coronary arteries and normal LVEF per cardiac cath and echo in 10/2013 -The patient has occasional, about monthly or less, rapid heartbeat which resolves with her bearing down. She is tolerating Toprol XL without problems. -Currently well controlled. At some point if she becomes symptomatic and unable to control the SVT with vagal maneuvers and beta blocker she may need to see electrophysiology for possible ablation.  Obesity -Has lost 9 pounds since April diet and exercise.  -Reviewed heart healthy diet with restriction of concentrated sweets and processed foods along with exercise and a plan to institute regular walking and progressed up to 30 minutes daily. She is in agreement.  Type 2 diabetes diet controlled -Hemoglobin A1c was 7.0 on 10/16/13. A1c was 8 in 04/2016 per pt report. Pt is working on improvements with diet and exercise.  -Discussed positive effects of exercise on diabetes as well as weight and cardiovascular health  Hyperlipidemia -LDL 114 and triglycerides 249 per lipid panel on 06/21/15  -Tried simvastatin 20 mg and did not tolerate due to dizziness, depression and fatigue which all resolved with discontinuation. Patient also did not tolerate atorvastatin in the past due to leg pain. -Discussed trying low  dose of Crestor 5 mg and could even take every other day. Pt has arrangement to have lipid panel in July and will discuss with her PCP. Pt does not have CAD per cath in 2015, but with diabetes and LDL>100 would benefit from a statin.   GERD -Takes Omeprazole. No problems unless forgets PPI for a few days.  Raynaud's phenomenon -No current complaints  Current medicines are reviewed with the patient today.  The patient does not have concerns regarding medicines other than what has been noted above.  The following changes have been made:  See above.  Labs/ tests ordered today include:   No orders of the defined types were placed in this encounter.    Disposition:   FU with Dr Marlou Porch in 1 year.   Patient is agreeable to this plan and will call if any problems develop in the interim.   Signed: Daune Perch, NP  06/26/2016 3:06 PM  [facility] [address] [place], [place]  [address] Phone: [phone] Fax: [phone]

## 2016-06-26 NOTE — Patient Instructions (Addendum)
Medication Instructions:  No changes with your medicines  Labwork: NONE  Testing/Procedures: NONE  Follow-Up: See Dr. Marlou Porch in one year - the office will contact you a few months prior to that time  Any Other Special Instructions Will Be Listed Below (If Applicable).     If you need a refill on your cardiac medications before your next appointment, please call your pharmacy.

## 2016-07-10 ENCOUNTER — Telehealth: Payer: Self-pay | Admitting: Internal Medicine

## 2016-07-10 MED ORDER — DOXYCYCLINE HYCLATE 100 MG PO TABS: ORAL_TABLET | ORAL | 0 refills | Status: DC

## 2016-07-10 NOTE — Telephone Encounter (Signed)
Spoke to patient about CY's recs. Doxy has been called into pharmacy. She is aware. Nothing else needed at time of call.

## 2016-07-10 NOTE — Telephone Encounter (Signed)
Spoke with pt. States that she is not feeling well. Reports cough, chest congestion and chest tightness. Cough is producing yellow mucus. Denies wheezing, SOB or fever. Symptoms started 2 days ago. Pt would like to be seen but is okay with having something called in.  CY - please advise. Thanks.  Allergies  Allergen Reactions  . Hydrocod Polst-Cpm Polst Er Other (See Comments)    hyperactivity  . Pseudoephedrine Palpitations and Other (See Comments)    REACTION: tachycardia  . Sulfonamide Derivatives Anaphylaxis and Swelling  . Azithromycin Itching  . Codeine Itching  . Dilaudid [Hydromorphone Hcl] Itching  . Morphine And Related Itching  . Penicillins Itching  . Shrimp [Shellfish Allergy] Itching  . Simvastatin Other (See Comments)    Fatigue, depression and dizziness at 20 mg  . Atorvastatin     Muscle cramps  And her blood sugar kept going up  . Cephalosporins Swelling    Said to cause facial swelling  . Clarithromycin Itching    REACTION: severe itching  . Lidocaine Other (See Comments)    unknown   Current Outpatient Prescriptions on File Prior to Visit  Medication Sig Dispense Refill  . ACCU-CHEK AVIVA PLUS test strip 1 each by Other route as needed (check blood sugar).   2  . amitriptyline (ELAVIL) 25 MG tablet Take 25 mg by mouth at bedtime.      Marland Kitchen azelastine (ASTELIN) 0.1 % nasal spray SPRAY 2 SPRAYS TWICE A DAY 30 mL 5  . calcium-vitamin D (OSCAL WITH D) 500-200 MG-UNIT tablet Take 1 tablet by mouth daily with breakfast.     . cetirizine (ZYRTEC) 10 MG tablet Take 10 mg by mouth daily.      Marland Kitchen doxycycline (VIBRA-TABS) 100 MG tablet 1 tab twice daily 20 tablet 0  . fluticasone (FLONASE) 50 MCG/ACT nasal spray Place 2 sprays into both nostrils daily.    Marland Kitchen levothyroxine (SYNTHROID, LEVOTHROID) 100 MCG tablet Take 100 mcg by mouth daily.      . Lutein 20 MG CAPS Take 1 capsule by mouth daily.      . metoprolol succinate (TOPROL-XL) 25 MG 24 hr tablet Take 1 tablet  (25 mg total) by mouth daily. 90 tablet 3  . omeprazole (PRILOSEC) 20 MG capsule Take 1 capsule (20 mg total) by mouth 2 (two) times daily before a meal. 60 capsule 0   Current Facility-Administered Medications on File Prior to Visit  Medication Dose Route Frequency Provider Last Rate Last Dose  . phenylephrine (NEO-SYNEPHRINE) 1 % nasal drops 3 drop  3 drop Each Nare Q6H PRN Baird Lyons D, MD   3 drop at 06/19/16 1230

## 2016-07-10 NOTE — Telephone Encounter (Signed)
Offer doxycycline 100 mg, # 8, 2 today then once daily

## 2016-07-17 ENCOUNTER — Encounter: Payer: Self-pay | Admitting: Gynecology

## 2016-07-17 DIAGNOSIS — Z1231 Encounter for screening mammogram for malignant neoplasm of breast: Secondary | ICD-10-CM | POA: Diagnosis not present

## 2016-07-17 DIAGNOSIS — E119 Type 2 diabetes mellitus without complications: Secondary | ICD-10-CM | POA: Diagnosis not present

## 2016-07-17 DIAGNOSIS — E785 Hyperlipidemia, unspecified: Secondary | ICD-10-CM | POA: Diagnosis not present

## 2016-07-17 DIAGNOSIS — K219 Gastro-esophageal reflux disease without esophagitis: Secondary | ICD-10-CM | POA: Diagnosis not present

## 2016-07-17 DIAGNOSIS — Z6839 Body mass index (BMI) 39.0-39.9, adult: Secondary | ICD-10-CM | POA: Diagnosis not present

## 2016-08-07 ENCOUNTER — Encounter: Payer: Self-pay | Admitting: Gynecology

## 2016-08-07 ENCOUNTER — Ambulatory Visit (INDEPENDENT_AMBULATORY_CARE_PROVIDER_SITE_OTHER): Payer: Medicare Other | Admitting: Gynecology

## 2016-08-07 VITALS — BP 130/80 | Ht 63.0 in | Wt 219.0 lb

## 2016-08-07 DIAGNOSIS — Z01411 Encounter for gynecological examination (general) (routine) with abnormal findings: Secondary | ICD-10-CM

## 2016-08-07 DIAGNOSIS — N952 Postmenopausal atrophic vaginitis: Secondary | ICD-10-CM | POA: Diagnosis not present

## 2016-08-07 DIAGNOSIS — N898 Other specified noninflammatory disorders of vagina: Secondary | ICD-10-CM

## 2016-08-07 DIAGNOSIS — M8588 Other specified disorders of bone density and structure, other site: Secondary | ICD-10-CM

## 2016-08-07 DIAGNOSIS — L298 Other pruritus: Secondary | ICD-10-CM | POA: Diagnosis not present

## 2016-08-07 DIAGNOSIS — B373 Candidiasis of vulva and vagina: Secondary | ICD-10-CM | POA: Diagnosis not present

## 2016-08-07 DIAGNOSIS — M8580 Other specified disorders of bone density and structure, unspecified site: Secondary | ICD-10-CM

## 2016-08-07 DIAGNOSIS — B3731 Acute candidiasis of vulva and vagina: Secondary | ICD-10-CM

## 2016-08-07 LAB — WET PREP FOR TRICH, YEAST, CLUE
Clue Cells Wet Prep HPF POC: NONE SEEN
TRICH WET PREP: NONE SEEN

## 2016-08-07 MED ORDER — FLUCONAZOLE 150 MG PO TABS: 150.0000 mg | ORAL_TABLET | Freq: Once | ORAL | 0 refills | Status: AC

## 2016-08-07 NOTE — Patient Instructions (Signed)
Take the one Diflucan pill for the vaginal itching and discharge. Take the second pill if this does not take care of the symptoms in several days.  Follow up in one year for annual exam

## 2016-08-07 NOTE — Addendum Note (Signed)
Addended by: Nelva Nay on: 08/07/2016 02:37 PM   Modules accepted: Orders

## 2016-08-07 NOTE — Progress Notes (Signed)
    Jill Dean September 16, 1945 [id number]        71 y.o.  G1P1001 for breast and pelvic exam. Patient also complaining of vaginal itching and slight discharge. Has been on several courses of antibiotics elsewhere for sinusitis.  Past medical history,surgical history, problem list, medications, allergies, family history and social history were all reviewed and documented as reviewed in the EPIC chart.  ROS:  Performed with pertinent positives and negatives included in the history, assessment and plan.   Additional significant findings :  None    Exam: Caryn Bee assistant Vitals:   08/07/16 1056  BP: 130/80  Weight: 219 lb (99.3 kg)  Height: 5\' 3"  (1.6 m)   Body mass index is 38.79 kg/m.  General appearance:  Normal affect, orientation and appearance. Skin: Grossly normal HEENT: Without gross lesions.  No cervical or supraclavicular adenopathy. Thyroid normal.  Lungs:  Clear without wheezing, rales or rhonchi Cardiac: RR, without RMG Abdominal:  Soft, nontender, without masses, guarding, rebound, organomegaly or hernia Breasts:  Examined lying and sitting without masses, retractions, discharge or axillary adenopathy. Pelvic:  Ext, BUS, Vagina: With atrophic changes. White discharge noted.  Adnexa: Without masses or tenderness    Anus and perineum: Normal   Rectovaginal: Normal sphincter tone without palpated masses or tenderness.    Assessment/Plan:  71 y.o. G74P1001 female for [place] to pelvic exam. Status post LSO 1984. LAVH RSO 2004 for endometriosis.   1. Postmenopausal/atrophic genital changes. Without significant vaginal dryness, hot flushes or night sweats. 2. Vaginal irritation with discharge following antibiotic treatment.  Wet prep and exam consistent with yeast vaginitis. Will treat with Diflucan 150 mg 1 dose. Extra dose provided in the event that her symptoms would linger given the length of antibiotic treatment. Follow up if symptoms persist, worsen or  recur 3. Mammography 07/2016. Continue with annual mammography next year. Breast exam normal today. SBE monthly reviewed. 4. Osteopenia. DEXA 2017 through her primary physician's office T score -1.7 FRAX 14.9%/2.9%. Continue to follow up with them in reference to bone health. 5. Colonoscopy 7 years ago with reported repeat interval 10 years. 6. Pap smear 2012. No Pap smear done today. No history of significant abnormal Pap smears. Discussed current screening guidelines and options to stop screening based on age and hysterectomy history. Patient comfortable with stop screening. 7. Health maintenance. No routine lab work done as patient does this elsewhere. Follow up in one year, sooner as needed.  Additional time in excess of her breast and pelvic exam was spent in direct face to face counseling and coordination of care in regards to her vaginitis with evaluation and treatment provided.    Anastasio Auerbach MD, 11:27 AM 08/07/2016

## 2016-08-23 ENCOUNTER — Telehealth: Payer: Self-pay | Admitting: Cardiology

## 2016-08-23 ENCOUNTER — Encounter: Payer: Self-pay | Admitting: Physician Assistant

## 2016-08-23 NOTE — Telephone Encounter (Signed)
Spoke with pt re message per pt feels weak with elevated heart rate and has noted elevation all day as a results has  taken a second Metoprolol 25 mg  Heart rate remains around 95 per pt but has only taken second Metoprolol 20 minutes ago.Informed pt to allow more time and  appt made for tom with Vin Bhagat PA at 10:30  Pt aware  if S/S  worsen to  go to ER  for eval and tx .will forward to Dr Marlou Porch for review .Adonis Housekeeper

## 2016-08-23 NOTE — Progress Notes (Signed)
Cardiology Office Note    Date:  08/24/2016   ID:  Jill Dean, DOB 12-08-1945, MRN [id number]  PCP:  Leeroy Cha, MD  Cardiologist:  Dr. Marlou Porch   Chief Complaint: Elevated Heart rate  History of Present Illness:   Jill Dean is a 71 y.o. 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   Patient has a history of presumed supraventricular tachycardia above 200 bpm per EMS with no strips as evidence in 10/2013 that resolved with vagal maneuver. She was then tried on diltiazem therapy which she did not tolerate and she was then switched to Toprol XL 25 mg daily, with an extra dose as needed for elevated heart rate which was working well for her. Monitoring in December 2015 demonstrated only PVCs/PACs and sinus tachycardia, no adverse arrhythmias.   A cardiac Catheterization on 10/22/13 showed normal coronary arteries and normal LV systolic function by noninvasive testing and mildly elevated LVEDP. An echocardiogram on 10/22/13 showed mild LVH, EF 60%, normal wall motion.  Her palpitation was stable (relieves with bear down) when last seen by APP 06/26/16.  Her palpitation usually occurs once every 2-3 months. Last episode approximately 2 months ago.   Patient has noted palpitation (HR of 117) with fatigue and tiredness since yesterday. She did vagal  Maneuver with resolved symptoms however reoccurred in few minutes. She took extra Toprol-XL 25 mg and her heart rate eventually calmed down after 1-1/2 hour. She has been feeling intermittent palpitation and fatigue since this morning. She denies chest pain, shortness of breath, orthopnea, PND, syncope, lower extremity edema on melena. No fever or chills.     Past Medical History:  Diagnosis Date  . Chest pain    a. s/p normal ett nuclear stress test on 10/16/13  . Complication of anesthesia   . Diabetes mellitus without complication ([place])   .  Dysrhythmia    fast heart rate  . GERD (gastroesophageal reflux disease)   . HLD (hyperlipidemia)   . Hypothyroidism   . Obesity   . Osteoarthrosis, unspecified whether generalized or localized, unspecified site   . Other chronic allergic conjunctivitis   . PONV (postoperative nausea and vomiting)   . Raynaud's syndrome   . Urticaria, unspecified     Past Surgical History:  Procedure Laterality Date  . CHOLECYSTECTOMY    . CYST EXCISION Left 11/10/2014   Procedure: EXCISION CYST LEFT INDEX FINGER AND CYST LEFT SMALL FINGER ;  Surgeon: [name], MD;  Location: [place];  Service: Orthopedics;  Laterality: Left;  . ESOPHAGOGASTRODUODENOSCOPY N/A 01/05/2014   Procedure: ESOPHAGOGASTRODUODENOSCOPY (EGD);  Surgeon: [name], MD;  Location: [facility];  Service: Endoscopy;  Laterality: N/A;  . knot right breast    . LEFT HEART CATHETERIZATION WITH CORONARY ANGIOGRAM N/A 10/22/2013   Procedure: LEFT HEART CATHETERIZATION WITH CORONARY ANGIOGRAM;  Surgeon: [name], MD;  Location: [facility];  Service: Cardiovascular;  Laterality: N/A;  . OOPHORECTOMY     LSO 84-RSO 04  . thyroid nodule    . TONSILLECTOMY    . VAGINAL HYSTERECTOMY  2004   LAVH RSO endometriosis  . VESICOVAGINAL FISTULA CLOSURE W/ TAH      Current Medications: Prior to Admission medications   Medication Sig Start Date End Date Taking? Authorizing Provider  ACCU-CHEK AVIVA PLUS test strip 1 each by Other route as needed (check blood sugar).  05/13/14   [provider]  amitriptyline (ELAVIL) 25 MG tablet Take 25 mg by mouth at bedtime.      [provider]  azelastine (ASTELIN) 0.1 % nasal spray SPRAY 2 SPRAYS TWICE A DAY 06/21/16   Young, Tarri Fuller D, MD  calcium-vitamin D (OSCAL WITH D) 500-200 MG-UNIT tablet Take 1 tablet by mouth daily with breakfast.     [provider]  cetirizine (ZYRTEC) 10 MG tablet Take 10 mg by mouth daily.      [provider]   doxycycline (VIBRA-TABS) 100 MG tablet 1 tab twice daily 06/19/16   Baird Lyons D, MD  fluticasone (FLONASE) 50 MCG/ACT nasal spray Place 2 sprays into both nostrils daily.    [provider]  levothyroxine (SYNTHROID, LEVOTHROID) 100 MCG tablet Take 100 mcg by mouth daily.      [provider]  Lutein 20 MG CAPS Take 1 capsule by mouth daily.      [provider]  metFORMIN (GLUCOPHAGE) 500 MG tablet Take 500 mg by mouth daily.    [provider]  metoprolol succinate (TOPROL-XL) 25 MG 24 hr tablet Take 1 tablet (25 mg total) by mouth daily. 11/29/15   Jerline Pain, MD  omeprazole (PRILOSEC) 20 MG capsule Take 1 capsule (20 mg total) by mouth 2 (two) times daily before a meal. 10/23/13   Hongalgi, Lenis Dickinson, MD    Allergies:   Hydrocod polst-cpm polst er; Pseudoephedrine; Sulfonamide derivatives; Azithromycin; Buprenorphine hcl; Codeine; Dilaudid [hydromorphone hcl]; Morphine and related; Penicillins; Shrimp [shellfish allergy]; Simvastatin; Atorvastatin; Cephalosporins; Erythromycin base; Penicillinase; Sulfamethoxazole; and Clarithromycin   Social History   Social History  . Marital status: Married    Spouse name: N/A  . Number of children: N/A  . Years of education: N/A   Occupational History  . Beautician    Social History Main Topics  . Smoking status: Never Smoker  . Smokeless tobacco: Never Used  . Alcohol use 0.0 oz/week     Comment: RARE  . Drug use: No  . Sexual activity: No     Comment: INTERCOURSE AGE UNKOWN , SEXUAL PARTNERS LESS THAN 5   Other Topics Concern  . None   Social History Narrative  . None     Family History:  The patient's family history includes COPD in her paternal uncle; Dementia in her mother; Heart disease in her father and maternal uncle; Heart failure in her mother; Lung cancer in her father and paternal uncle.   ROS:   Please see the history of present illness.    ROS All other systems reviewed and  are negative.   PHYSICAL EXAM:   VS:  BP 130/80   Pulse 98   Ht 5\' 4"  (1.626 m)   Wt 217 lb 6.4 oz (98.6 kg)   BMI 37.32 kg/m    GEN: Well nourished, well developed, in no acute distress  HEENT: normal  Neck: no JVD, carotid bruits, or masses Cardiac: RRR; no murmurs, rubs, or gallops,no edema  Respiratory:  clear to auscultation bilaterally, normal work of breathing GI: soft, nontender, nondistended, + BS MS: no deformity or atrophy  Skin: warm and dry, no rash Neuro:  Alert and Oriented x 3, Strength and sensation are intact Psych: euthymic mood, full affect  Wt Readings from Last 3 Encounters:  08/24/16 217 lb 6.4 oz (98.6 kg)  08/07/16 219 lb (99.3 kg)  06/26/16 217 lb 12.8 oz (98.8 kg)      Studies/Labs Reviewed:   EKG:  EKG is ordered today.  The ekg ordered today demonstrates sinus rhythm at rate of 98 bpm. Bifascicular block. No change.   Recent Labs: No results found for requested labs within last 8760 hours.   Lipid Panel    Component Value Date/Time   CHOL 223 (H) 06/21/2015 1007   TRIG 249 (H) 06/21/2015 1007   HDL 59 06/21/2015 1007   CHOLHDL 3.8 06/21/2015 1007   VLDL 50 (H) 06/21/2015 1007   LDLCALC 114 06/21/2015 1007    Additional studies/ records that were reviewed today include:   Echocardiogram: 10/2013 Study Conclusions  - Left ventricle: The cavity size was normal. Wall thickness was increased in a pattern of mild LVH. The estimated ejection fraction was 60%. Wall motion was normal; there were no regional wall motion abnormalities. - Right ventricle: The cavity size was normal. Systolic function was normal.   ASSESSMENT & PLAN:    1. Hx of SVT - Patient has intermittent hx of palpitation (presumed SVT) resolved with vagal maneuver since 2015. Intermittent palpitation with fatigue since yesterday. Her symptoms recur after vegal maneuver (new). Did felt better after extra Toprol XL 25mg  yesterday. However worsen this morning.  No arrthymias on EKG. Discuss 30 days monitor however patient is going to be checked for vacation for 2 weeks starting Sep 1st. Advised to increase Toprol-XL to 50 mg daily. Takes short acting when necessary metoprolol 25 mg for breakthrough palpitation. If no improvement by tomorrow, she will give [place] a call and we will get 48 hour monitor before her vacation. Check TSH. She will let us know or go to ER f no improvement.   2. HLD - intolerance to statin. Followed by PCP.   3. DM - Per PCP    Medication Adjustments/Labs and Tests Ordered: Current medicines are reviewed at length with the patient today.  Concerns regarding medicines are outlined above.  Medication changes, Labs and Tests ordered today are listed in the Patient Instructions below. Patient Instructions  Medication Instructions:  Your physician has recommended you make the following change in your medication:   1. INCREASE metoprolol succinate (Toprol-XL) to 50 mg (1 tablet) once daily  2. You may take metoprolol tartrate (lopressor) 25 mg (1 tablet) once daily AS NEEDED for palpitations.   Labwork: LABS TODAY: TSH  Testing/Procedures: None ordered  Follow-Up: Your physician wants you to follow-up in: 1 month with Dr. Marlou Porch or APP on his team on a day when Dr. Marlou Porch is in the office   Any Other Special Instructions Will Be Listed Below (If Applicable).     If you need a refill on your cardiac medications before your next appointment, please call your pharmacy.      Jarrett Soho, [place]  08/24/2016 11:07 AM    [facility] [place], [place], [place]  [address] Phone: [phone]; Fax: [phone]

## 2016-08-23 NOTE — Telephone Encounter (Signed)
Jill Dean is calling because her heart is beating to fast , beats are like at 87, 93, 94, and 117 when she gets up and move and she is just feeling bad . Please call

## 2016-08-24 ENCOUNTER — Ambulatory Visit (INDEPENDENT_AMBULATORY_CARE_PROVIDER_SITE_OTHER): Payer: Medicare Other | Admitting: Physician Assistant

## 2016-08-24 ENCOUNTER — Encounter: Payer: Self-pay | Admitting: Physician Assistant

## 2016-08-24 VITALS — BP 130/80 | HR 98 | Ht 64.0 in | Wt 217.4 lb

## 2016-08-24 DIAGNOSIS — I471 Supraventricular tachycardia: Secondary | ICD-10-CM | POA: Diagnosis not present

## 2016-08-24 DIAGNOSIS — E785 Hyperlipidemia, unspecified: Secondary | ICD-10-CM

## 2016-08-24 DIAGNOSIS — E1169 Type 2 diabetes mellitus with other specified complication: Secondary | ICD-10-CM

## 2016-08-24 DIAGNOSIS — R002 Palpitations: Secondary | ICD-10-CM | POA: Diagnosis not present

## 2016-08-24 LAB — TSH: TSH: 2.69 u[IU]/mL (ref 0.450–4.500)

## 2016-08-24 MED ORDER — METOPROLOL TARTRATE 25 MG PO TABS
25.0000 mg | ORAL_TABLET | Freq: Every day | ORAL | 3 refills | Status: DC | PRN
Start: 2016-08-24 — End: 2017-06-22

## 2016-08-24 MED ORDER — METOPROLOL SUCCINATE ER 50 MG PO TB24: 50.0000 mg | ORAL_TABLET | Freq: Every day | ORAL | 3 refills | Status: DC

## 2016-08-24 NOTE — Patient Instructions (Addendum)
Medication Instructions:  Your physician has recommended you make the following change in your medication:   1. INCREASE metoprolol succinate (Toprol-XL) to 50 mg (1 tablet) once daily  2. You may take metoprolol tartrate (lopressor) 25 mg (1 tablet) once daily AS NEEDED for palpitations.   Labwork: LABS TODAY: TSH  Testing/Procedures: None ordered  Follow-Up: Your physician wants you to follow-up in: 1 month with Dr. Marlou Porch or APP on his team on a day when Dr. Marlou Porch is in the office   Any Other Special Instructions Will Be Listed Below (If Applicable).     If you need a refill on your cardiac medications before your next appointment, please call your pharmacy.

## 2016-08-24 NOTE — Telephone Encounter (Signed)
Agree with plan. Took extra metoprolol. Thanks Candee Furbish, MD

## 2016-09-27 ENCOUNTER — Encounter: Payer: Self-pay | Admitting: Nurse Practitioner

## 2016-09-27 ENCOUNTER — Ambulatory Visit (INDEPENDENT_AMBULATORY_CARE_PROVIDER_SITE_OTHER): Payer: Medicare Other | Admitting: Nurse Practitioner

## 2016-09-27 VITALS — BP 130/72 | HR 101 | Ht 64.5 in | Wt 217.1 lb

## 2016-09-27 DIAGNOSIS — R002 Palpitations: Secondary | ICD-10-CM | POA: Diagnosis not present

## 2016-09-27 DIAGNOSIS — I471 Supraventricular tachycardia: Secondary | ICD-10-CM

## 2016-09-27 NOTE — Progress Notes (Signed)
CARDIOLOGY OFFICE NOTE  Date:  09/27/2016    [name] Date of Birth: 1945-07-26 Medical Record #[id number]  PCP:  Leeroy Cha, MD  Cardiologist:  [facility]  Chief Complaint  Patient presents with  . Palpitations    Follow up visit - seen for Dr. Marlou Porch    History of Present Illness: Jill Dean is a 71 y.o. female who presents today for a follow up visit. Seen for Dr. Marlou Porch.   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   Patient has a history of presumed supraventricular tachycardia above 200 bpm per EMS with no strips as evidence in 10/2015that resolved with vagal maneuver.She was then tried on diltiazem therapy which she did not tolerate and she was then switched to Toprol XL 25 mg daily, with an extra dose as needed for elevated heart rate which was working well for her. Monitoring in December 2015 demonstrated only PVCs/PACs and sinus tachycardia, no adverse arrhythmias.   She has has prior cardiac cath back in October of 2015 which showed normal coronary arteries and normal LV systolic function and mildly elevated LVEDP. An echocardiogram on 10/22/13 showed mild LVH, EF 60%, normal wall motion.  She was seen back in June with palpitations. Then seen again last month - had had recurrent palpitations - resolving with vagal maneuvers and extra beta blocker therapy. Event monitor was ordered but there was not placed due to the patient going on vacation.   Comes in today. Here alone.   Past Medical History:  Diagnosis Date  . Chest pain    a. s/p normal ett nuclear stress test on 10/16/13  . Complication of anesthesia   . Diabetes mellitus without complication ([place])   . Dysrhythmia    fast heart rate  . GERD (gastroesophageal reflux disease)   . HLD (hyperlipidemia)   . Hypothyroidism   . Obesity   . Osteoarthrosis, unspecified whether generalized or localized, unspecified site   . Other chronic allergic  conjunctivitis   . PONV (postoperative nausea and vomiting)   . Raynaud's syndrome   . Urticaria, unspecified     Past Surgical History:  Procedure Laterality Date  . CHOLECYSTECTOMY    . CYST EXCISION Left 11/10/2014   Procedure: EXCISION CYST LEFT INDEX FINGER AND CYST LEFT SMALL FINGER ;  Surgeon: [name], MD;  Location: [facility] [facility];  Service: Orthopedics;  Laterality: Left;  . ESOPHAGOGASTRODUODENOSCOPY N/A 01/05/2014   Procedure: ESOPHAGOGASTRODUODENOSCOPY (EGD);  Surgeon: [name], MD;  Location: [facility];  Service: Endoscopy;  Laterality: N/A;  . knot right breast    . LEFT HEART CATHETERIZATION WITH CORONARY ANGIOGRAM N/A 10/22/2013   Procedure: LEFT HEART CATHETERIZATION WITH CORONARY ANGIOGRAM;  Surgeon: [name], MD;  Location: [facility];  Service: Cardiovascular;  Laterality: N/A;  . OOPHORECTOMY     LSO 84-RSO 04  . thyroid nodule    . TONSILLECTOMY    . VAGINAL HYSTERECTOMY  2004   LAVH RSO endometriosis  . VESICOVAGINAL FISTULA CLOSURE W/ TAH       Medications: Current Meds  Medication Sig  . ACCU-CHEK AVIVA PLUS test strip 1 each by Other route as needed (check blood sugar).   Marland Kitchen amitriptyline (ELAVIL) 25 MG tablet Take 25 mg by mouth at bedtime.    Marland Kitchen azelastine (ASTELIN) 0.1 % nasal spray SPRAY 2 SPRAYS TWICE A DAY  . calcium-vitamin D (OSCAL WITH D) 500-200 MG-UNIT tablet Take 1  tablet by mouth daily with breakfast.   . cetirizine (ZYRTEC) 10 MG tablet Take 10 mg by mouth daily.    Marland Kitchen levothyroxine (SYNTHROID, LEVOTHROID) 100 MCG tablet Take 100 mcg by mouth daily.    . Lutein 20 MG CAPS Take 1 capsule by mouth daily.    . metFORMIN (GLUCOPHAGE) 500 MG tablet Take 500 mg by mouth daily.  . metoprolol succinate (TOPROL-XL) 50 MG 24 hr tablet Take 1 tablet (50 mg total) by mouth daily. Take with or immediately following a meal.  . metoprolol tartrate (LOPRESSOR) 25 MG tablet Take 1 tablet (25 mg total) by mouth daily as needed  (palpitations).  Marland Kitchen omeprazole (PRILOSEC) 20 MG capsule Take 1 capsule (20 mg total) by mouth 2 (two) times daily before a meal.   Current Facility-Administered Medications for the 09/27/16 encounter (Office Visit) with Burtis Junes, NP  Medication  . phenylephrine (NEO-SYNEPHRINE) 1 % nasal drops 3 drop     Allergies: Allergies  Allergen Reactions  . Hydrocod Polst-Cpm Polst Er Other (See Comments)    hyperactivity  . Pseudoephedrine Itching, Palpitations and Other (See Comments)    REACTION: tachycardia   . Sulfonamide Derivatives Anaphylaxis and Swelling  . Azithromycin Itching  . Buprenorphine Hcl Itching  . Codeine Itching  . Dilaudid [Hydromorphone Hcl] Itching  . Morphine And Related Itching  . Penicillins Itching  . Shrimp [Shellfish Allergy] Itching  . Simvastatin Other (See Comments)    Fatigue, depression and dizziness at 20 mg  . Atorvastatin     Muscle cramps  And her blood sugar kept going up  . Cephalosporins Swelling    Said to cause facial swelling  . Erythromycin Base Itching and Swelling  . Penicillinase Itching  . Sulfamethoxazole Itching  . Clarithromycin Itching    REACTION: severe itching    Social History: The patient  reports that she has never smoked. She has never used smokeless tobacco. She reports that she drinks alcohol. She reports that she does not use drugs.   Family History: The patient's family history includes COPD in her paternal uncle; Dementia in her mother; Heart disease in her father and maternal uncle; Heart failure in her mother; Lung cancer in her father and paternal uncle.   Review of Systems: Please see the history of present illness.   Otherwise, the review of systems is positive for none.   All other systems are reviewed and negative.   Physical Exam: VS:  Ht 5' 4.5" (1.638 m)   Wt 217 lb 1.9 oz (98.5 kg)   BMI 36.69 kg/m  .  BMI Body mass index is 36.69 kg/m.  Wt Readings from Last 3 Encounters:  09/27/16 217  lb 1.9 oz (98.5 kg)  08/24/16 217 lb 6.4 oz (98.6 kg)  08/07/16 219 lb (99.3 kg)    General: Pleasant. Obese female who is alert and in no acute distress.   HEENT: Normal.  Neck: Supple, no JVD, carotid bruits, or masses noted.  Cardiac: Regular rate and rhythm. No murmurs, rubs, or gallops. No edema.  Respiratory:  Lungs are clear to auscultation bilaterally with normal work of breathing.  GI: Soft and nontender.  MS: No deformity or atrophy. Gait and ROM intact.  Skin: Warm and dry. Color is normal.  Neuro:  Strength and sensation are intact and no gross focal deficits noted.  Psych: Alert, appropriate and with normal affect.   LABORATORY DATA:  EKG:  EKG is not ordered today.  Lab Results  Component Value  Date   WBC 8.5 12/13/2013   HGB 13.1 12/13/2013   HCT 40.1 12/13/2013   PLT 250 12/13/2013   GLUCOSE 105 (H) 12/13/2013   CHOL 223 (H) 06/21/2015   TRIG 249 (H) 06/21/2015   HDL 59 06/21/2015   LDLCALC 114 06/21/2015   ALT 15 06/21/2015   NA 142 12/13/2013   K 4.5 12/13/2013   CL 100 12/13/2013   CREATININE 0.63 12/13/2013   BUN 14 12/13/2013   CO2 27 12/13/2013   TSH 2.690 08/24/2016   INR 1.00 10/22/2013   HGBA1C 7.0 (H) 10/16/2013       BNP (last 3 results) No results for input(s): BNP in the last 8760 hours.  ProBNP (last 3 results) No results for input(s): PROBNP in the last 8760 hours.   Other Studies Reviewed Today:  Echocardiogram: 10/2013 Study Conclusions  - Left ventricle: The cavity size was normal. Wall thickness was increased in a pattern of mild LVH. The estimated ejection fraction was 60%. Wall motion was normal; there were no regional wall motion abnormalities. - Right ventricle: The cavity size was normal. Systolic function was normal.  Assessment/Plan: 1. Hx of SVT - Patient has intermittent hx of palpitation (presumed SVT) resolved with vagal maneuver since 2015. Intermittent palpitation with fatigue since yesterday.  Her symptoms recur after vegal maneuver (new). Did felt better after extra Toprol XL 25mg  yesterday. However worsen this morning. No arrthymias on EKG. Discuss 30 days monitor however patient is going to be checked for vacation for 2 weeks starting Sep 1st. Advised to increase Toprol-XL to 50 mg daily. Takes short acting when necessary metoprolol 25 mg for breakthrough palpitation. If no improvement by tomorrow, she will give [place] a call and we will get 48 hour monitor before her vacation. Check TSH. She will let us know or go to ER f no improvement.   2. HLD - intolerance to statin. Followed by PCP.   3. DM - Per PCP  Current medicines are reviewed with the patient today.  The patient does not have concerns regarding medicines other than what has been noted above.  The following changes have been made:  See above.  Labs/ tests ordered today include:   No orders of the defined types were placed in this encounter.    Disposition:   FU with Dr. Marlou Porch in 6 months.   Patient is agreeable to this plan and will call if any problems develop in the interim.   SignedTruitt Merle, NP  09/27/2016 1:36 PM  [facility] [address] [place], [place]  [address] Phone: [phone] Fax: [phone]

## 2016-09-27 NOTE — Patient Instructions (Addendum)
We will be checking the following labs today - NONE   Medication Instructions:    Continue with your current medicines.     Testing/Procedures To Be Arranged:  Event monitor  Follow-Up:   See [name] - EP    Other Special Instructions:   Restrict your caffeine    If you need a refill on your cardiac medications before your next appointment, please call your pharmacy.   Call the [place] office at [phone] if you have any questions, problems or concerns.

## 2016-09-27 NOTE — Progress Notes (Signed)
CARDIOLOGY OFFICE NOTE  Date:  09/27/2016    Jill Dean Date of Birth: 09-13-1945 Medical Record #[id number]  PCP:  Leeroy Cha, MD  Cardiologist:  [facility]  Chief Complaint  Patient presents with  . Palpitations    Follow up visit - seen for Dr. Marlou Porch    History of Present Illness: Jill Dean is a 71 y.o. female who presents today for a follow up visit. Seen for Dr. Marlou Porch.   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   Patient has a history of presumed supraventricular tachycardia above 200 bpm per EMS with no strips as evidence in 10/2015that resolved with vagal maneuver.She was then tried on diltiazem therapy which she did not tolerate and she was then switched to Toprol XL 25 mg daily, with an extra dose as needed for elevated heart rate which was working well for her. Monitoring in December 2015 demonstrated only PVCs/PACs and sinus tachycardia, no adverse arrhythmias.   She has has prior cardiac cath back in October of 2015 which showed normal coronary arteries and normal LV systolic function and mildly elevated LVEDP. An echocardiogram on 10/22/13 showed mild LVH, EF 60%, normal wall motion.  She was seen back in June with palpitations. Then seen again last month - had had recurrent palpitations - resolving with vagal maneuvers and extra beta blocker therapy. Event monitor was discussed but was not placed due to the patient going on vacation.   Comes in today. Here alone. She notes that she still feels fatigue and elevated HR periodically. HR is elevated when she checks - can be in the 130's. She does use vagal maneuvers which helps her feel better. She just increased her Toprol as of this week - she does not like how the Toprol makes her feel and it has taken a month to work up to this dose - she is not sure she will be able to continue long term. No prn Lopressor used. Says she is "just tired of this mess".  Asking what can be done. Thyroid level was ok. She does use some caffeine. Tries to be active but says it is her HR that limits her.   Past Medical History:  Diagnosis Date  . Chest pain    a. s/p normal ett nuclear stress test on 10/16/13  . Complication of anesthesia   . Diabetes mellitus without complication ([place])   . Dysrhythmia    fast heart rate  . GERD (gastroesophageal reflux disease)   . HLD (hyperlipidemia)   . Hypothyroidism   . Obesity   . Osteoarthrosis, unspecified whether generalized or localized, unspecified site   . Other chronic allergic conjunctivitis   . PONV (postoperative nausea and vomiting)   . Raynaud's syndrome   . Urticaria, unspecified     Past Surgical History:  Procedure Laterality Date  . CHOLECYSTECTOMY    . CYST EXCISION Left 11/10/2014   Procedure: EXCISION CYST LEFT INDEX FINGER AND CYST LEFT SMALL FINGER ;  Surgeon: [name], MD;  Location: [facility];  Service: Orthopedics;  Laterality: Left;  . ESOPHAGOGASTRODUODENOSCOPY N/A 01/05/2014   Procedure: ESOPHAGOGASTRODUODENOSCOPY (EGD);  Surgeon: [name], MD;  Location: [facility];  Service: Endoscopy;  Laterality: N/A;  . knot right breast    . LEFT HEART CATHETERIZATION WITH CORONARY ANGIOGRAM N/A 10/22/2013   Procedure: LEFT HEART CATHETERIZATION WITH CORONARY ANGIOGRAM;  Surgeon: [name], MD;  Location: [facility];  Service: Cardiovascular;  Laterality: N/A;  . OOPHORECTOMY     LSO 84-RSO 04  . thyroid nodule    . TONSILLECTOMY    . VAGINAL HYSTERECTOMY  2004   LAVH RSO endometriosis  . VESICOVAGINAL FISTULA CLOSURE W/ TAH       Medications: Current Meds  Medication Sig  . ACCU-CHEK AVIVA PLUS test strip 1 each by Other route as needed (check blood sugar).   Marland Kitchen amitriptyline (ELAVIL) 25 MG tablet Take 25 mg by mouth at bedtime.    Marland Kitchen azelastine (ASTELIN) 0.1 % nasal spray SPRAY 2 SPRAYS TWICE A DAY  . calcium-vitamin D (OSCAL WITH D) 500-200 MG-UNIT  tablet Take 1 tablet by mouth daily with breakfast.   . cetirizine (ZYRTEC) 10 MG tablet Take 10 mg by mouth daily.    Marland Kitchen levothyroxine (SYNTHROID, LEVOTHROID) 100 MCG tablet Take 100 mcg by mouth daily.    . Lutein 20 MG CAPS Take 1 capsule by mouth daily.    . metFORMIN (GLUCOPHAGE) 500 MG tablet Take 500 mg by mouth daily.  . metoprolol succinate (TOPROL-XL) 50 MG 24 hr tablet Take 1 tablet (50 mg total) by mouth daily. Take with or immediately following a meal.  . metoprolol tartrate (LOPRESSOR) 25 MG tablet Take 1 tablet (25 mg total) by mouth daily as needed (palpitations).  Marland Kitchen omeprazole (PRILOSEC) 20 MG capsule Take 1 capsule (20 mg total) by mouth 2 (two) times daily before a meal.   Current Facility-Administered Medications for the 09/27/16 encounter (Office Visit) with Burtis Junes, NP  Medication  . phenylephrine (NEO-SYNEPHRINE) 1 % nasal drops 3 drop     Allergies: Allergies  Allergen Reactions  . Hydrocod Polst-Cpm Polst Er Other (See Comments)    hyperactivity  . Pseudoephedrine Itching, Palpitations and Other (See Comments)    REACTION: tachycardia   . Sulfonamide Derivatives Anaphylaxis and Swelling  . Azithromycin Itching  . Buprenorphine Hcl Itching  . Codeine Itching  . Dilaudid [Hydromorphone Hcl] Itching  . Morphine And Related Itching  . Penicillins Itching  . Shrimp [Shellfish Allergy] Itching  . Simvastatin Other (See Comments)    Fatigue, depression and dizziness at 20 mg  . Atorvastatin     Muscle cramps  And her blood sugar kept going up  . Cephalosporins Swelling    Said to cause facial swelling  . Erythromycin Base Itching and Swelling  . Penicillinase Itching  . Sulfamethoxazole Itching  . Clarithromycin Itching    REACTION: severe itching    Social History: The patient  reports that she has never smoked. She has never used smokeless tobacco. She reports that she drinks alcohol. She reports that she does not use drugs.   Family  History: The patient's family history includes COPD in her paternal uncle; Dementia in her mother; Heart disease in her father and maternal uncle; Heart failure in her mother; Lung cancer in her father and paternal uncle.   Review of Systems: Please see the history of present illness.   Otherwise, the review of systems is positive for none.   All other systems are reviewed and negative.   Physical Exam: VS:  BP 130/72 (BP Location: Left Arm, Patient Position: Sitting, Cuff Size: Large)   Pulse (!) 101   Ht 5' 4.5" (1.638 m)   Wt 217 lb 1.9 oz (98.5 kg)   SpO2 97% Comment: at rest  BMI 36.69 kg/m  .  BMI Body mass index is 36.69 kg/m.  Wt Readings from Last 3 Encounters:  09/27/16 217 lb 1.9 oz (98.5 kg)  08/24/16 217 lb 6.4 oz (98.6 kg)  08/07/16 219 lb (99.3 kg)    General: Pleasant. Obese female who is alert and in no acute distress.   HEENT: Normal.  Neck: Supple, no JVD, carotid bruits, or masses noted.  Cardiac: Regular rate and rhythm. No murmurs, rubs, or gallops. No edema.  Respiratory:  Lungs are clear to auscultation bilaterally with normal work of breathing.  GI: Soft and nontender.  MS: No deformity or atrophy. Gait and ROM intact.  Skin: Warm and dry. Color is normal.  Neuro:  Strength and sensation are intact and no gross focal deficits noted.  Psych: Alert, appropriate and with normal affect.   LABORATORY DATA:  EKG:  EKG is not ordered today.  Lab Results  Component Value Date   WBC 8.5 12/13/2013   HGB 13.1 12/13/2013   HCT 40.1 12/13/2013   PLT 250 12/13/2013   GLUCOSE 105 (H) 12/13/2013   CHOL 223 (H) 06/21/2015   TRIG 249 (H) 06/21/2015   HDL 59 06/21/2015   LDLCALC 114 06/21/2015   ALT 15 06/21/2015   NA 142 12/13/2013   K 4.5 12/13/2013   CL 100 12/13/2013   CREATININE 0.63 12/13/2013   BUN 14 12/13/2013   CO2 27 12/13/2013   TSH 2.690 08/24/2016   INR 1.00 10/22/2013   HGBA1C 7.0 (H) 10/16/2013       BNP (last 3 results) No  results for input(s): BNP in the last 8760 hours.  ProBNP (last 3 results) No results for input(s): PROBNP in the last 8760 hours.   Other Studies Reviewed Today:  Echocardiogram: 10/2013 Study Conclusions  - Left ventricle: The cavity size was normal. Wall thickness was increased in a pattern of mild LVH. The estimated ejection fraction was 60%. Wall motion was normal; there were no regional wall motion abnormalities. - Right ventricle: The cavity size was normal. Systolic function was normal.  Assessment/Plan: 1. Hx of SVT - Patient has intermittent hx of palpitation (presumed SVT) resolved with vagal maneuver since 2015. Has not been documented by EKG/telemetry. Will place event monitor and plan for EP follow up to discuss her options. She has not tolerated CCB and may not tolerate beta blocker as well. TSH is normal. Needs to restrict caffeine.   2. HLD - intolerance to statin. Followed by PCP.   3. DM - Per PCP  4. Prior negative cardiac cath  5. Obesity - I'm sure deconditioning is playing a role.   Current medicines are reviewed with the patient today.  The patient does not have concerns regarding medicines other than what has been noted above.  The following changes have been made:  See above.  Labs/ tests ordered today include:    Orders Placed This Encounter  Procedures  . Ambulatory referral to Cardiac Electrophysiology  . Cardiac event monitor     Disposition:   Further disposition to follow.   Patient is agreeable to this plan and will call if any problems develop in the interim.   SignedTruitt Merle, NP  09/27/2016 1:58 PM  [facility] [address] [place], [place]  [address] Phone: [phone] Fax: [phone]

## 2016-10-11 ENCOUNTER — Ambulatory Visit (INDEPENDENT_AMBULATORY_CARE_PROVIDER_SITE_OTHER): Payer: Medicare Other

## 2016-10-11 DIAGNOSIS — R002 Palpitations: Secondary | ICD-10-CM | POA: Diagnosis not present

## 2016-10-11 DIAGNOSIS — I471 Supraventricular tachycardia: Secondary | ICD-10-CM

## 2016-11-06 DIAGNOSIS — E119 Type 2 diabetes mellitus without complications: Secondary | ICD-10-CM | POA: Diagnosis not present

## 2016-11-13 DIAGNOSIS — Z0000 Encounter for general adult medical examination without abnormal findings: Secondary | ICD-10-CM | POA: Diagnosis not present

## 2016-11-13 DIAGNOSIS — Z1211 Encounter for screening for malignant neoplasm of colon: Secondary | ICD-10-CM | POA: Diagnosis not present

## 2016-11-13 DIAGNOSIS — Z8249 Family history of ischemic heart disease and other diseases of the circulatory system: Secondary | ICD-10-CM | POA: Diagnosis not present

## 2016-11-13 DIAGNOSIS — K219 Gastro-esophageal reflux disease without esophagitis: Secondary | ICD-10-CM | POA: Diagnosis not present

## 2016-11-13 DIAGNOSIS — Z6839 Body mass index (BMI) 39.0-39.9, adult: Secondary | ICD-10-CM | POA: Diagnosis not present

## 2016-11-13 DIAGNOSIS — E119 Type 2 diabetes mellitus without complications: Secondary | ICD-10-CM | POA: Diagnosis not present

## 2016-11-13 DIAGNOSIS — R635 Abnormal weight gain: Secondary | ICD-10-CM | POA: Diagnosis not present

## 2016-11-13 DIAGNOSIS — M85859 Other specified disorders of bone density and structure, unspecified thigh: Secondary | ICD-10-CM | POA: Diagnosis not present

## 2016-11-13 DIAGNOSIS — R002 Palpitations: Secondary | ICD-10-CM | POA: Diagnosis not present

## 2016-11-13 DIAGNOSIS — E785 Hyperlipidemia, unspecified: Secondary | ICD-10-CM | POA: Diagnosis not present

## 2016-11-13 DIAGNOSIS — Z1389 Encounter for screening for other disorder: Secondary | ICD-10-CM | POA: Diagnosis not present

## 2016-11-13 DIAGNOSIS — E039 Hypothyroidism, unspecified: Secondary | ICD-10-CM | POA: Diagnosis not present

## 2016-11-17 ENCOUNTER — Other Ambulatory Visit: Payer: Self-pay | Admitting: Cardiology

## 2016-11-17 NOTE — Telephone Encounter (Signed)
Medication Detail    Disp Refills Start End   metoprolol succinate (TOPROL-XL) 50 MG 24 hr tablet 90 tablet 3 08/24/2016 11/22/2016   Sig - Route: Take 1 tablet (50 mg total) by mouth daily. Take with or immediately following a meal. - Oral   Sent to pharmacy as: metoprolol succinate (TOPROL-XL) 50 MG 24 hr tablet   E-Prescribing Status: Receipt confirmed by pharmacy (08/24/2016 11:02 AM EDT)   Pharmacy   [id number] - [place], [place] - [place]

## 2016-11-22 ENCOUNTER — Ambulatory Visit (INDEPENDENT_AMBULATORY_CARE_PROVIDER_SITE_OTHER): Payer: Medicare Other | Admitting: Internal Medicine

## 2016-11-22 ENCOUNTER — Encounter: Payer: Self-pay | Admitting: Internal Medicine

## 2016-11-22 VITALS — BP 124/68 | HR 87 | Ht 64.5 in | Wt 223.0 lb

## 2016-11-22 DIAGNOSIS — I471 Supraventricular tachycardia: Secondary | ICD-10-CM

## 2016-11-22 NOTE — Progress Notes (Signed)
HPI Jill Dean is referred today by Truitt Merle for evaluation of tachy- palpitations. She is a very pleasant 71 year old woman who has a history of documented SVT approximately 3 years ago. At that time she was shopping and suddenly began to feel very bad. She did not experience palpitations. She called 911 and on arrival the paramedics documented SVT at over 200 bpm. Vagal maneuvers subsequently terminated the SVT. In the interim, she has had occasional palpitations and has had documented heart rates in the 120 to 130 range which is sinus tachycardia. The patient is worn a 30 day cardiac monitor demonstrating the same. She denies anginal symptoms. No chest pain or shortness of breath. Her husband notes that she snores quite loudly. She has never been evaluated for sleep apnea. Allergies  Allergen Reactions  . Hydrocod Polst-Cpm Polst Er Other (See Comments)    hyperactivity  . Pseudoephedrine Itching, Palpitations and Other (See Comments)    REACTION: tachycardia   . Sulfonamide Derivatives Anaphylaxis and Swelling  . Azithromycin Itching  . Buprenorphine Hcl Itching  . Codeine Itching  . Dilaudid [Hydromorphone Hcl] Itching  . Morphine And Related Itching  . Penicillins Itching  . Shrimp [Shellfish Allergy] Itching  . Simvastatin Other (See Comments)    Fatigue, depression and dizziness at 20 mg  . Atorvastatin     Muscle cramps  And her blood sugar kept going up  . Cephalosporins Swelling    Said to cause facial swelling  . Erythromycin Base Itching and Swelling  . Penicillinase Itching  . Sulfamethoxazole Itching  . Clarithromycin Itching    REACTION: severe itching     Current Outpatient Medications  Medication Sig Dispense Refill  . ACCU-CHEK AVIVA PLUS test strip 1 each by Other route as needed (check blood sugar).   2  . amitriptyline (ELAVIL) 25 MG tablet Take 25 mg by mouth at bedtime.      Marland Kitchen azelastine (ASTELIN) 0.1 % nasal spray SPRAY 2 SPRAYS TWICE A DAY  30 mL 5  . calcium-vitamin D (OSCAL WITH D) 500-200 MG-UNIT tablet Take 1 tablet by mouth daily with breakfast.     . cetirizine (ZYRTEC) 10 MG tablet Take 10 mg by mouth daily.      Marland Kitchen levothyroxine (SYNTHROID, LEVOTHROID) 100 MCG tablet Take 100 mcg by mouth daily.      . Lutein 20 MG CAPS Take 1 capsule by mouth daily.      . metFORMIN (GLUCOPHAGE) 500 MG tablet Take 500 mg by mouth daily.    . metoprolol succinate (TOPROL-XL) 50 MG 24 hr tablet Take 1 tablet (50 mg total) by mouth daily. Take with or immediately following a meal. 90 tablet 3  . omeprazole (PRILOSEC) 20 MG capsule Take 1 capsule (20 mg total) by mouth 2 (two) times daily before a meal. 60 capsule 0  . metoprolol tartrate (LOPRESSOR) 25 MG tablet Take 1 tablet (25 mg total) by mouth daily as needed (palpitations). (Patient not taking: Reported on 11/22/2016) 30 tablet 3   Current Facility-Administered Medications  Medication Dose Route Frequency Provider Last Rate Last Dose  . phenylephrine (NEO-SYNEPHRINE) 1 % nasal drops 3 drop  3 drop Each Nare Q6H PRN Baird Lyons D, MD   3 drop at 06/19/16 1230     Past Medical History:  Diagnosis Date  . Chest pain    a. s/p normal ett nuclear stress test on 10/16/13  . Complication of anesthesia   . Diabetes mellitus without complication (  [place])   . Dysrhythmia    fast heart rate  . GERD (gastroesophageal reflux disease)   . HLD (hyperlipidemia)   . Hypothyroidism   . Obesity   . Osteoarthrosis, unspecified whether generalized or localized, unspecified site   . Other chronic allergic conjunctivitis   . PONV (postoperative nausea and vomiting)   . Raynaud's syndrome   . Urticaria, unspecified     ROS:   All systems reviewed and negative except as noted in the HPI.   Past Surgical History:  Procedure Laterality Date  . CHOLECYSTECTOMY    . CYST EXCISION Left 11/10/2014   Procedure: EXCISION CYST LEFT INDEX FINGER AND CYST LEFT SMALL FINGER ;  Surgeon: [name], MD;   Location: [facility];  Service: Orthopedics;  Laterality: Left;  . ESOPHAGOGASTRODUODENOSCOPY N/A 01/05/2014   Procedure: ESOPHAGOGASTRODUODENOSCOPY (EGD);  Surgeon: [name], MD;  Location: [facility];  Service: Endoscopy;  Laterality: N/A;  . knot right breast    . LEFT HEART CATHETERIZATION WITH CORONARY ANGIOGRAM N/A 10/22/2013   Procedure: LEFT HEART CATHETERIZATION WITH CORONARY ANGIOGRAM;  Surgeon: [name], MD;  Location: [facility];  Service: Cardiovascular;  Laterality: N/A;  . OOPHORECTOMY     LSO 84-RSO 04  . thyroid nodule    . TONSILLECTOMY    . VAGINAL HYSTERECTOMY  2004   LAVH RSO endometriosis  . VESICOVAGINAL FISTULA CLOSURE W/ TAH       Family History  Problem Relation Age of Onset  . Lung cancer Father   . Heart disease Father   . Heart failure Mother   . Dementia Mother   . Heart disease Maternal Uncle   . Lung cancer Paternal Uncle   . COPD Paternal Uncle      Social History   Socioeconomic History  . Marital status: Married    Spouse name: Not on file  . Number of children: Not on file  . Years of education: Not on file  . Highest education level: Not on file  Social Needs  . Financial resource strain: Not on file  . Food insecurity - worry: Not on file  . Food insecurity - inability: Not on file  . Transportation needs - medical: Not on file  . Transportation needs - non-medical: Not on file  Occupational History  . Occupation: Beautician  Tobacco Use  . Smoking status: Never Smoker  . Smokeless tobacco: Never Used  Substance and Sexual Activity  . Alcohol use: Yes    Alcohol/week: 0.0 oz    Comment: RARE  . Drug use: No  . Sexual activity: No    Birth control/protection: Surgical    Comment: INTERCOURSE AGE UNKOWN , SEXUAL PARTNERS LESS THAN 5  Other Topics Concern  . Not on file  Social History Narrative  . Not on file     BP 124/68   Pulse 87   Ht 5' 4.5" (1.638 m)   Wt 223 lb (101.2 kg)    SpO2 97%   BMI 37.69 kg/m   Physical Exam:  Well appearing obese 71 year old woman, NAD HEENT: Unremarkable Neck:  6 cm JVD, no thyromegally Lymphatics:  No adenopathy Back:  No CVA tenderness Lungs:  Clear, with no wheezes, rales, or rhonchi HEART:  Regular rate rhythm, no murmurs, no rubs, no clicks Abd:  soft, positive bowel sounds, no organomegally, no rebound, no guarding Ext:  2 plus pulses, no edema, no cyanosis, no clubbing Skin:  No rashes no nodules Neuro:  CN  II through XII intact, motor grossly intact  EKG - normal sinus rhythm with no preexcitation  30 day monitor - I personally reviewed her 30 day monitor demonstrating sinus rhythm and sinus tachycardia. There is no documented SVT.  Assess/Plan: 1. SVT - the patient had one episode of SVT documented. She does not have palpitations. I discussed and recommended watchful waiting unless she has more episodes. She will continue her beta blocker. She is instructed to avoid caffeine and alcohol. 2. Sinus tachycardia - I suspect she has some evidence of autonomic dysfunction. She does have palpitations when her heart rate is in the 120-130 range. We discussed the importance of regular daily activity including exercise and walking, as well as avoiding caffeine. I do not think she has atrial tachycardia. 3. Obesity - the importance of weight loss was emphasized. 4. HTN - her blood pressure is well controlled. Will follow. I have asked her to increase her activity and avoid salty food.  Mikle Bosworth.D.

## 2016-11-22 NOTE — Patient Instructions (Addendum)
Medication Instructions:  ?Your physician recommends that you continue on your current medications as directed. Please refer to the Current Medication list given to you today. ? ?Labwork: ?None ordered. ? ?Testing/Procedures: ?None ordered. ? ?Follow-Up: ?Your physician wants you to follow-up as needed ? ? ?Any Other Special Instructions Will Be Listed Below (If Applicable). ? ?If you need a refill on your cardiac medications before your next appointment, please call your pharmacy.  ? ? ? ? ?

## 2016-11-24 ENCOUNTER — Telehealth: Payer: Self-pay | Admitting: Internal Medicine

## 2016-11-24 MED ORDER — DOXYCYCLINE HYCLATE 100 MG PO TABS: 100.0000 mg | ORAL_TABLET | Freq: Two times a day (BID) | ORAL | 0 refills | Status: DC

## 2016-11-24 NOTE — Telephone Encounter (Signed)
Spoke with the pt  She states having sinus pressure, HA and prod cough with yellow to green sputum x 2 days  No f/c/s or other co's She is asking for doxy  She has multiple drug allergies which I verified with her:  Allergies  Allergen Reactions  . Hydrocod Polst-Cpm Polst Er Other (See Comments)    hyperactivity  . Pseudoephedrine Itching, Palpitations and Other (See Comments)    REACTION: tachycardia   . Sulfonamide Derivatives Anaphylaxis and Swelling  . Azithromycin Itching  . Buprenorphine Hcl Itching  . Codeine Itching  . Dilaudid [Hydromorphone Hcl] Itching  . Morphine And Related Itching  . Penicillins Itching  . Shrimp [Shellfish Allergy] Itching  . Simvastatin Other (See Comments)    Fatigue, depression and dizziness at 20 mg  . Atorvastatin     Muscle cramps  And her blood sugar kept going up  . Cephalosporins Swelling    Said to cause facial swelling  . Erythromycin Base Itching and Swelling  . Penicillinase Itching  . Sulfamethoxazole Itching  . Clarithromycin Itching    REACTION: severe itching

## 2016-11-24 NOTE — Telephone Encounter (Signed)
Doxy 100 mg bid x 10 days  ?

## 2016-11-24 NOTE — Telephone Encounter (Signed)
Spoke with pt and advised Rx's sent to the pharmacy. Nothing further is needed.

## 2016-12-05 DIAGNOSIS — K219 Gastro-esophageal reflux disease without esophagitis: Secondary | ICD-10-CM | POA: Diagnosis not present

## 2016-12-05 DIAGNOSIS — Z1210 Encounter for screening for malignant neoplasm of intestinal tract, unspecified: Secondary | ICD-10-CM | POA: Diagnosis not present

## 2017-01-25 DIAGNOSIS — Z1211 Encounter for screening for malignant neoplasm of colon: Secondary | ICD-10-CM | POA: Diagnosis not present

## 2017-01-25 DIAGNOSIS — K5730 Diverticulosis of large intestine without perforation or abscess without bleeding: Secondary | ICD-10-CM | POA: Diagnosis not present

## 2017-01-25 DIAGNOSIS — K635 Polyp of colon: Secondary | ICD-10-CM | POA: Diagnosis not present

## 2017-01-30 DIAGNOSIS — K635 Polyp of colon: Secondary | ICD-10-CM | POA: Diagnosis not present

## 2017-02-26 ENCOUNTER — Other Ambulatory Visit: Payer: Self-pay

## 2017-02-26 DIAGNOSIS — L570 Actinic keratosis: Secondary | ICD-10-CM | POA: Diagnosis not present

## 2017-02-26 DIAGNOSIS — B078 Other viral warts: Secondary | ICD-10-CM | POA: Diagnosis not present

## 2017-02-26 DIAGNOSIS — D485 Neoplasm of uncertain behavior of skin: Secondary | ICD-10-CM | POA: Diagnosis not present

## 2017-03-01 ENCOUNTER — Ambulatory Visit (INDEPENDENT_AMBULATORY_CARE_PROVIDER_SITE_OTHER): Payer: Medicare Other | Admitting: Internal Medicine

## 2017-03-01 ENCOUNTER — Other Ambulatory Visit: Payer: Medicare Other

## 2017-03-01 ENCOUNTER — Encounter: Payer: Self-pay | Admitting: Internal Medicine

## 2017-03-01 VITALS — BP 138/72 | HR 88 | Ht 63.0 in | Wt 226.0 lb

## 2017-03-01 DIAGNOSIS — J0181 Other acute recurrent sinusitis: Secondary | ICD-10-CM

## 2017-03-01 DIAGNOSIS — J0101 Acute recurrent maxillary sinusitis: Secondary | ICD-10-CM | POA: Diagnosis not present

## 2017-03-01 DIAGNOSIS — Z880 Allergy status to penicillin: Secondary | ICD-10-CM | POA: Diagnosis not present

## 2017-03-01 MED ORDER — PHENYLEPHRINE HCL 1 % NA SOLN
3.0000 [drp] | Freq: Once | NASAL | Status: AC
  Administered 2017-03-01: 3 [drp] via NASAL

## 2017-03-01 MED ORDER — METHYLPREDNISOLONE ACETATE 80 MG/ML IJ SUSP
80.0000 mg | Freq: Once | INTRAMUSCULAR | Status: AC
  Administered 2017-03-01: 80 mg via INTRAMUSCULAR

## 2017-03-01 MED ORDER — DOXYCYCLINE HYCLATE 100 MG PO TABS
100.0000 mg | ORAL_TABLET | Freq: Two times a day (BID) | ORAL | 0 refills | Status: DC
Start: 2017-03-01 — End: 2017-06-22

## 2017-03-01 NOTE — Progress Notes (Signed)
    Patient ID: Jill Dean, female    DOB: 10/01/1945, 72 y.o.   MRN: [id number]  HPI female never smoker followed for allergic rhinitis/conjunctivitis, complicated by history Raynaud's syndrome, GERD, urticaria, DM, PSVT  ---------------------------------------------------------------------------------------------------  06/19/16- 72 year old female never smoker followed for allergic rhinitis/conjunctivitis, complicated by history Raynaud's syndrome, GERD, urticaria, DM, PSVT Allergic Rhinitis: Pt here today still having alot of nasal drainage, she has noticed the mucus is a yellow color, the drainage is going down her throat, she is coughing She states her breathing is doing ok, Denies wheezing, Per pt she did not start the prednisone  Doxycycline seemed to be helping with didn't last long enough. Astelin nasal spray definitely stops postnasal drip and she asks refill. Chose not to take prednisone. While on Astelin for sore throats resolve suggesting postnasal drainage cause that. She is not noticing reflux. Saw Dr. Johnnette Gourd and reports he said he couldn't help her. She continues sinus rinse with Neti pot.  03/01/17- 72 year old female never smoker followed for allergic rhinitis/conjunctivitis, complicated by history Raynaud's syndrome, GERD, urticaria, DM, PSVT ----Sinusitis; Pt states she is having sinus pressure with congestion-blowing out different colors. Now moving to her chest. Pt has started to cough.  Using Nettie pot.  Not sure how long ago diagnosis of penicillin allergy made based on itching-remote.  eview of Systems-See HPI + = positive Constitutional:   No-   weight loss, night sweats, fevers, chills, fatigue, lassitude. HEENT:    headaches, difficulty swallowing, tooth/dental problems, +sore throat,       sneezing, itching, ear ache, +nasal congestion, +post nasal drip,  CV:  No-   chest pain, orthopnea, PND, swelling in lower extremities, anasarca,  dizziness, palpitations Resp:  No-   shortness of breath with exertion or at rest.            +-productive cough,  No non-productive cough,  No-  coughing up of blood.              -change in color of mucus.  No- wheezing.    Objective:   Physical Exam General- Alert, Oriented, Affect-appropriate, Distress- none acute  Overweight Skin- rash-none, lesions- none, excoriation- none Lymphadenopathy- none Head- atraumatic            Eyes- Gross vision intact, PERRLA, conjunctivae clear secretions, +periorbital edema            Ears- Hearing,             Nose- +mucus, , No-Septal dev,  polyps, erosion, perforation,             Throat- Mallampati III-IV , mucosa clear-not red , drainage- none, tonsils- atrophic.  Neck- flexible , trachea midline, no stridor , thyroid nl, carotid no bruit Chest -            Lung- clear to P&A but diminished, wheeze- none, cough- none , dullness-none, rub- none           Chest wall-  Abd-  Br/ Gen/ Rectal- Not done, not indicated Extrem- cyanosis- none, clubbing, none, atrophy- none, strength- nl Neuro- grossly intact to observation

## 2017-03-01 NOTE — Patient Instructions (Addendum)
Script sent for doxycycline  Order- neb neo nasal     Dx acute recurrent maxillary sinusitis             Depo 28  Order- lab    Penicillin allergy antibodies  PEN G and PEN V     Dx penicillin allergy  Ok to keep June 24 appointment

## 2017-03-01 NOTE — Assessment & Plan Note (Signed)
Recurrent acute sinusitis.  She has limited available antibiotics based on current information. Plan-nasal Neo-Synephrine inhalation, Depo-Medrol, doxycycline. Lab for penicillin allergy antibodies

## 2017-03-06 LAB — ALLERGEN PENICILLIN V (MINOR)
Allergen Penicillin V (Minor): <0.1 kU/L
CLASS: 0

## 2017-03-06 LAB — ALLERGEN PENICILLIN G IGE: CLASS: 0

## 2017-04-09 DIAGNOSIS — E119 Type 2 diabetes mellitus without complications: Secondary | ICD-10-CM | POA: Diagnosis not present

## 2017-04-09 DIAGNOSIS — J019 Acute sinusitis, unspecified: Secondary | ICD-10-CM | POA: Diagnosis not present

## 2017-04-09 DIAGNOSIS — E039 Hypothyroidism, unspecified: Secondary | ICD-10-CM | POA: Diagnosis not present

## 2017-04-09 DIAGNOSIS — E785 Hyperlipidemia, unspecified: Secondary | ICD-10-CM | POA: Diagnosis not present

## 2017-05-22 ENCOUNTER — Telehealth: Payer: Self-pay | Admitting: Internal Medicine

## 2017-05-22 MED ORDER — AMOXICILLIN 500 MG PO TABS: 500.0000 mg | ORAL_TABLET | Freq: Two times a day (BID) | ORAL | 0 refills | Status: DC

## 2017-05-22 NOTE — Telephone Encounter (Signed)
Offer amoxacillin 500 mg, , # 14, 1 twice daily

## 2017-05-22 NOTE — Telephone Encounter (Signed)
Spoke with pt, she states she has a sore throat from drainage. She is coughing up yellow/brown mucus. She is traveling to the beach Friday. Denies fever or SOB. She states it is draining in her chest and wants something called in for her symptoms. She took Zyrtec and nasal spray but she feels she still needs something. She states she tried Amoxicillin and it really worked.   [facility]  Current Outpatient Medications on File Prior to Visit  Medication Sig Dispense Refill  . ACCU-CHEK AVIVA PLUS test strip 1 each by Other route as needed (check blood sugar).   2  . amitriptyline (ELAVIL) 25 MG tablet Take 25 mg by mouth at bedtime.      Marland Kitchen azelastine (ASTELIN) 0.1 % nasal spray SPRAY 2 SPRAYS TWICE A DAY 30 mL 5  . calcium-vitamin D (OSCAL WITH D) 500-200 MG-UNIT tablet Take 1 tablet by mouth daily with breakfast.     . cetirizine (ZYRTEC) 10 MG tablet Take 10 mg by mouth daily.      Marland Kitchen doxycycline (VIBRA-TABS) 100 MG tablet Take 1 tablet (100 mg total) by mouth 2 (two) times daily. 20 tablet 0  . levothyroxine (SYNTHROID, LEVOTHROID) 100 MCG tablet Take 100 mcg by mouth daily.      . Lutein 20 MG CAPS Take 1 capsule by mouth daily.      . metFORMIN (GLUCOPHAGE) 500 MG tablet Take 500 mg by mouth daily.    . metoprolol succinate (TOPROL-XL) 50 MG 24 hr tablet Take 1 tablet (50 mg total) by mouth daily. Take with or immediately following a meal. 90 tablet 3  . metoprolol tartrate (LOPRESSOR) 25 MG tablet Take 1 tablet (25 mg total) by mouth daily as needed (palpitations). 30 tablet 3  . omeprazole (PRILOSEC) 20 MG capsule Take 1 capsule (20 mg total) by mouth 2 (two) times daily before a meal. 60 capsule 0   No current facility-administered medications on file prior to visit.    Allergies  Allergen Reactions  . Hydrocod Polst-Cpm Polst Er Other (See Comments)    hyperactivity  . Pseudoephedrine Itching, Palpitations and Other (See Comments)    REACTION: tachycardia   . Sulfonamide  Derivatives Anaphylaxis and Swelling  . Azithromycin Itching  . Buprenorphine Hcl Itching  . Codeine Itching  . Dilaudid [Hydromorphone Hcl] Itching  . Morphine And Related Itching  . Penicillins Itching  . Shrimp [Shellfish Allergy] Itching  . Simvastatin Other (See Comments)    Fatigue, depression and dizziness at 20 mg  . Atorvastatin     Muscle cramps  And her blood sugar kept going up  . Cephalosporins Swelling    Said to cause facial swelling  . Erythromycin Base Itching and Swelling  . Penicillinase Itching  . Sulfamethoxazole Itching  . Clarithromycin Itching    REACTION: severe itching

## 2017-05-22 NOTE — Telephone Encounter (Signed)
Pt is aware of below message and voiced her understanding.  Rx for Amoxacillin 50mg  has been sent to preferred pharmacy. Per pt's chart- allergy to Penicillins and cephalosporins. Okay per CY verbally.  Pt stated that she had testing performed for penicillin allergy and it was negative. Pt stated she has taking amoxacillin previously with no reactions.  Nothing further is needed.

## 2017-06-05 ENCOUNTER — Encounter (INDEPENDENT_AMBULATORY_CARE_PROVIDER_SITE_OTHER): Payer: Self-pay | Admitting: Orthopedic Surgery

## 2017-06-05 ENCOUNTER — Ambulatory Visit (INDEPENDENT_AMBULATORY_CARE_PROVIDER_SITE_OTHER): Payer: Self-pay

## 2017-06-05 ENCOUNTER — Ambulatory Visit (INDEPENDENT_AMBULATORY_CARE_PROVIDER_SITE_OTHER): Payer: Medicare Other

## 2017-06-05 ENCOUNTER — Ambulatory Visit (INDEPENDENT_AMBULATORY_CARE_PROVIDER_SITE_OTHER): Payer: Medicare Other | Admitting: Orthopedic Surgery

## 2017-06-05 DIAGNOSIS — S92514A Nondisplaced fracture of proximal phalanx of right lesser toe(s), initial encounter for closed fracture: Secondary | ICD-10-CM | POA: Diagnosis not present

## 2017-06-05 DIAGNOSIS — M79671 Pain in right foot: Secondary | ICD-10-CM

## 2017-06-05 NOTE — Progress Notes (Signed)
Office Visit Note   Patient: Jill Dean           Date of Birth: [birth year]           MRN: [id number] Visit Date: 06/05/2017              Requested by: Leeroy Cha, MD [address] [place], [place] [address] PCP: Leeroy Cha, MD  Chief Complaint  Patient presents with  . Right Foot - Pain, Edema      HPI: Patient presents for evaluation for right foot second toe.  Patient states that she struck it getting out of bed about 5 days ago.  She complains of pain swelling and redness.  She is used ice and Epson salts which she states helps.  Assessment & Plan: Visit Diagnoses:  1. Pain in right foot   2. Closed nondisplaced fracture of proximal phalanx of lesser toe of right foot, initial encounter     Plan: Patient is given a postoperative shoes she will wear this to her foot is comfortable.  She will continue with ice and elevation and anti-inflammatories.  Follow-Up Instructions: Return if symptoms worsen or fail to improve.   Ortho Exam  Patient is alert, oriented, no adenopathy, well-dressed, normal affect, normal respiratory effort. Examination patient has a good dorsalis pedis pulse.  She has some swelling of the second toe this is tender to palpation there is no angular deformity.  Radiographs shows a nondisplaced fracture of the proximal phalanx second toe right foot  Imaging: Xr Foot Complete Right  Result Date: 06/05/2017 2 view show non displaced fracture of second toe proximal phanylx  No images are attached to the encounter.  Labs: Lab Results  Component Value Date   HGBA1C 7.0 (H) 10/16/2013   HGBA1C 7.2 (H) 10/16/2013   LABORGA Multiple bacterial morphotypes present, none 06/03/2014   LABORGA predominant. Suggest appropriate recollection if  06/03/2014   LABORGA clinically indicated. 06/03/2014     No results found for: ALBUMIN, PREALBUMIN, LABURIC  There is no height or weight on file to calculate BMI.  Orders:    Orders Placed This Encounter  Procedures  . XR Foot Complete Left  . XR Foot Complete Right   No orders of the defined types were placed in this encounter.    Procedures: No procedures performed  Clinical Data: No additional findings.  ROS:  All other systems negative, except as noted in the HPI. Review of Systems  Objective: Vital Signs: There were no vitals taken for this visit.  Specialty Comments:  No specialty comments available.  PMFS History: Patient Active Problem List   Diagnosis Date Noted  . Closed nondisplaced fracture of proximal phalanx of lesser toe of right foot 06/05/2017  . Non-suppurative otitis media 05/19/2015  . PSVT (paroxysmal supraventricular tachycardia) ([place]) 10/29/2013  . Essential hypertension 10/29/2013  . Type 2 diabetes mellitus without complication ([place]) 23/55/7322  . History of tachycardia 10/22/2013  . RBBB (right bundle branch block) 10/22/2013  . Drug effect 10/22/2013  . Chest pain 10/16/2013  . Palpitations 10/16/2013  . DM (diabetes mellitus) ([place])   . Obesity   . HLD (hyperlipidemia)   . SVT (supraventricular tachycardia) ([place]) 10/15/2013  . Hypothyroidism   . Endometriosis   . RHINOSINUSITIS, ACUTE 02/08/2009  . GERD 02/11/2007  . Raynaud's syndrome 02/07/2007  . Seasonal and perennial allergic rhinitis 02/07/2007  . URTICARIA 02/07/2007  . OSTEOARTHRITIS 02/07/2007   Past Medical History:  Diagnosis Date  . Chest  pain    a. s/p normal ett nuclear stress test on 10/16/13  . Complication of anesthesia   . Diabetes mellitus without complication ([place])   . Dysrhythmia    fast heart rate  . GERD (gastroesophageal reflux disease)   . HLD (hyperlipidemia)   . Hypothyroidism   . Obesity   . Osteoarthrosis, unspecified whether generalized or localized, unspecified site   . Other chronic allergic conjunctivitis   . PONV (postoperative nausea and vomiting)   . Raynaud's syndrome   . Urticaria, unspecified     Family  History  Problem Relation Age of Onset  . Lung cancer Father   . Heart disease Father   . Heart failure Mother   . Dementia Mother   . Heart disease Maternal Uncle   . Lung cancer Paternal Uncle   . COPD Paternal Uncle     Past Surgical History:  Procedure Laterality Date  . CHOLECYSTECTOMY    . CYST EXCISION Left 11/10/2014   Procedure: EXCISION CYST LEFT INDEX FINGER AND CYST LEFT SMALL FINGER ;  Surgeon: [name], MD;  Location: [facility];  Service: Orthopedics;  Laterality: Left;  . ESOPHAGOGASTRODUODENOSCOPY N/A 01/05/2014   Procedure: ESOPHAGOGASTRODUODENOSCOPY (EGD);  Surgeon: [name], MD;  Location: [facility];  Service: Endoscopy;  Laterality: N/A;  . knot right breast    . LEFT HEART CATHETERIZATION WITH CORONARY ANGIOGRAM N/A 10/22/2013   Procedure: LEFT HEART CATHETERIZATION WITH CORONARY ANGIOGRAM;  Surgeon: [name], MD;  Location: [facility];  Service: Cardiovascular;  Laterality: N/A;  . OOPHORECTOMY     LSO 84-RSO 04  . thyroid nodule    . TONSILLECTOMY    . VAGINAL HYSTERECTOMY  2004   [facility] RSO endometriosis  . VESICOVAGINAL FISTULA CLOSURE W/ TAH     Social History   Occupational History  . Occupation: Beautician  Tobacco Use  . Smoking status: Never Smoker  . Smokeless tobacco: Never Used  Substance and Sexual Activity  . Alcohol use: Yes    Alcohol/week: 0.0 oz    Comment: RARE  . Drug use: No  . Sexual activity: Never    Birth control/protection: Surgical    Comment: INTERCOURSE AGE UNKOWN , SEXUAL PARTNERS LESS THAN 5

## 2017-06-11 DIAGNOSIS — B353 Tinea pedis: Secondary | ICD-10-CM | POA: Diagnosis not present

## 2017-06-11 DIAGNOSIS — S92314D Nondisplaced fracture of first metatarsal bone, right foot, subsequent encounter for fracture with routine healing: Secondary | ICD-10-CM | POA: Diagnosis not present

## 2017-06-11 DIAGNOSIS — E1165 Type 2 diabetes mellitus with hyperglycemia: Secondary | ICD-10-CM | POA: Diagnosis not present

## 2017-06-22 ENCOUNTER — Ambulatory Visit (INDEPENDENT_AMBULATORY_CARE_PROVIDER_SITE_OTHER): Payer: Medicare Other | Admitting: Internal Medicine

## 2017-06-22 ENCOUNTER — Encounter: Payer: Self-pay | Admitting: Internal Medicine

## 2017-06-22 DIAGNOSIS — K219 Gastro-esophageal reflux disease without esophagitis: Secondary | ICD-10-CM

## 2017-06-22 DIAGNOSIS — J302 Other seasonal allergic rhinitis: Secondary | ICD-10-CM | POA: Diagnosis not present

## 2017-06-22 DIAGNOSIS — J3089 Other allergic rhinitis: Secondary | ICD-10-CM | POA: Diagnosis not present

## 2017-06-22 MED ORDER — AMOXICILLIN 500 MG PO TABS: 500.0000 mg | ORAL_TABLET | Freq: Two times a day (BID) | ORAL | 3 refills | Status: DC

## 2017-06-22 MED ORDER — AZELASTINE HCL 0.1 % NA SOLN: NASAL | 12 refills | Status: DC

## 2017-06-22 NOTE — Patient Instructions (Signed)
I have removed penicillins from your allergy list since you are tolerating amoxacillin without problem  Refill sent for astelin nasal spray and amoxacillin  Please call if we can help

## 2017-06-22 NOTE — Progress Notes (Signed)
    Patient ID: Jill Dean, female    DOB: Apr 26, 1945, 72 y.o.   MRN: [id number]  HPI female never smoker followed for allergic rhinitis/conjunctivitis, complicated by history Raynaud's syndrome, GERD, urticaria, DM, PSVT  ---------------------------------------------------------------------------------------------------  03/01/17- 72 year old female never smoker followed for allergic rhinitis/conjunctivitis, complicated by history Raynaud's syndrome, GERD, urticaria, DM, PSVT ----Sinusitis; Pt states she is having sinus pressure with congestion-blowing out different colors. Now moving to her chest. Pt has started to cough.  Using Nettie pot.  Not sure how long ago diagnosis of penicillin allergy made based on itching-remote.  06/22/2017- 72 year old female never smoker followed for allergic rhinitis/conjunctivitis, complicated by history Raynaud's syndrome, GERD, urticaria, DM, PSVT -----Recurrent Sinusitis and Urticaria; Pt states she is doing well overall.  Astelin nasal spray, Zyrtec Astelin and Flonase help well that are usually sufficient.  She would like to keep amoxicillin available.  We discussed her remote history of possible rash to penicillin.  She has had no problems with amoxicillin so we are removing penicillin allergy from her chart.  Review of Systems-See HPI + = positive Constitutional:   No-   weight loss, night sweats, fevers, chills, fatigue, lassitude. HEENT:    headaches, difficulty swallowing, tooth/dental problems, +sore throat,       sneezing, itching, ear ache, +nasal congestion, +post nasal drip,  CV:  No-   chest pain, orthopnea, PND, swelling in lower extremities, anasarca,  dizziness, palpitations Resp: No-   shortness of breath with exertion or at rest.            -productive cough,  No non-productive cough,  No-  coughing up of blood.              -change in color of mucus.  No- wheezing.    Objective:   Physical Exam General- Alert, Oriented,  Affect-appropriate, Distress- none acute  Overweight Skin- rash-none, lesions- none, excoriation- none Lymphadenopathy- none Head- atraumatic            Eyes- Gross vision intact, PERRLA, conjunctivae clear secretions, +periorbital edema            Ears- Hearing,             Nose- +mucus, , No-Septal dev,  polyps, erosion, perforation,             Throat- Mallampati III-IV , mucosa clear-not red , drainage- none, tonsils- atrophic.  Neck- flexible , trachea midline, no stridor , thyroid nl, carotid no bruit Chest -            Lung- clear to P&A but diminished, wheeze- none, cough- none , dullness-none, rub- none           Chest wall-  Abd-  Br/ Gen/ Rectal- Not done, not indicated Extrem- cyanosis- none, clubbing, none, atrophy- none, strength- nl Neuro- grossly intact to observation

## 2017-06-25 ENCOUNTER — Ambulatory Visit: Payer: Medicare Other | Admitting: Internal Medicine

## 2017-06-25 NOTE — Assessment & Plan Note (Signed)
Satisfactory control with out difficulty exacerbations.  She could transfer her care and scripts to her PCP when she is read.y

## 2017-06-25 NOTE — Assessment & Plan Note (Signed)
Reinforced reflux precautions

## 2017-06-28 ENCOUNTER — Ambulatory Visit (INDEPENDENT_AMBULATORY_CARE_PROVIDER_SITE_OTHER): Payer: Medicare Other | Admitting: Cardiology

## 2017-06-28 ENCOUNTER — Encounter: Payer: Self-pay | Admitting: Cardiology

## 2017-06-28 VITALS — BP 122/74 | HR 91 | Ht 63.0 in | Wt 224.2 lb

## 2017-06-28 DIAGNOSIS — R002 Palpitations: Secondary | ICD-10-CM | POA: Diagnosis not present

## 2017-06-28 DIAGNOSIS — I471 Supraventricular tachycardia: Secondary | ICD-10-CM | POA: Diagnosis not present

## 2017-06-28 DIAGNOSIS — E785 Hyperlipidemia, unspecified: Secondary | ICD-10-CM

## 2017-06-28 DIAGNOSIS — E1169 Type 2 diabetes mellitus with other specified complication: Secondary | ICD-10-CM | POA: Diagnosis not present

## 2017-06-28 DIAGNOSIS — E119 Type 2 diabetes mellitus without complications: Secondary | ICD-10-CM

## 2017-06-28 NOTE — Patient Instructions (Signed)

## 2017-06-28 NOTE — Progress Notes (Signed)
[facility]. [address], [address] [place],   [address] Phone: [phone] Fax:  [phone]  Date:  06/28/2017   ID:  Jill Dean, DOB 08/02/1945, MRN [id number]  PCP:  Leeroy Cha, MD   History of Present Illness: Jill Dean is a 72 y.o. female with presumed supraventricular tachycardia of 200 bpm per EMS on monitor, no strips that resolved with vagal maneuvers in early October 2015. She was placed on diltiazem but felt poorly on this medication and this was stopped. Subsequently, monitoring demonstrated in December 2015 PVC/PACs and sinus tachycardia only. No adverse arrhythmias. We started Toprol-XL 25 mg once a day.  Maybe has 1 min episode of PSVT, Valsalva maneuver still work. She's not having any chest pain. She does have leg cramps, calf cramps at night. She is sad that Dr Amedeo Kinsman is leaving. She did discuss the possibility of starting cholesterol medication. I think this is a good idea. We will go ahead and give her atorvastatin. See below. She is worried about cramping..   Had cardiac catheterization which was unremarkable. Echocardiogram was reassuring. Treadmill test was reassuring. Does not wish to take statins.  After leaving hospital, she had a brief episode of tachycardia arrhythmia which terminated with Valsalva. No syncope, no chest pain.  Vit D was low, diffuse pain.   Has not had any racing. +Hot flash.  Sometimes has sweats. Emotional at times.  Excited about her daughter, she is  a grandmother.  She tried the atorvastatin but stopped after proximally 6 days. Her blood sugar kept increasing and she had worsening cramps she states.  She also had another episode of PSVT transiently when being tired/exhausted.  06/28/2017-since my last visit, she has seen Dr. Crissie Sickles on 11/22/2016.  Continue with watchful waiting.  Beta-blocker.  Avoiding alcohol caffeine.  Daily exercise.  He did not think she had an atrial tachycardia.  30-day event monitor  reviewed sinus rhythm and sinus tachycardia.  No SVT. Overall she has been doing quite well.  Very rarely she felt any palpitations since her visit with [name].  She knows to take additional AV nodal blocking agent if necessary.  She did however hit her foot on a metal table, broke her toe.  In a flat shoe support, Dr. Sharol Given.  Wt Readings from Last 3 Encounters:  06/28/17 224 lb 3.2 oz (101.7 kg)  06/22/17 220 lb 3.2 oz (99.9 kg)  03/01/17 226 lb (102.5 kg)     Past Medical History:  Diagnosis Date  . Chest pain    a. s/p normal ett nuclear stress test on 10/16/13  . Complication of anesthesia   . Diabetes mellitus without complication ([place])   . Dysrhythmia    fast heart rate  . GERD (gastroesophageal reflux disease)   . HLD (hyperlipidemia)   . Hypothyroidism   . Obesity   . Osteoarthrosis, unspecified whether generalized or localized, unspecified site   . Other chronic allergic conjunctivitis   . PONV (postoperative nausea and vomiting)   . Raynaud's syndrome   . Urticaria, unspecified     Past Surgical History:  Procedure Laterality Date  . CHOLECYSTECTOMY    . CYST EXCISION Left 11/10/2014   Procedure: EXCISION CYST LEFT INDEX FINGER AND CYST LEFT SMALL FINGER ;  Surgeon: [name], MD;  Location: [facility];  Service: Orthopedics;  Laterality: Left;  . ESOPHAGOGASTRODUODENOSCOPY N/A 01/05/2014   Procedure: ESOPHAGOGASTRODUODENOSCOPY (EGD);  Surgeon: [name], MD;  Location:  [facility];  Service: Endoscopy;  Laterality: N/A;  . knot right breast    . LEFT HEART CATHETERIZATION WITH CORONARY ANGIOGRAM N/A 10/22/2013   Procedure: LEFT HEART CATHETERIZATION WITH CORONARY ANGIOGRAM;  Surgeon: [name], MD;  Location: [facility]ATH LAB;  Service: Cardiovascular;  Laterality: N/A;  . OOPHORECTOMY     LSO 84-RSO 04  . thyroid nodule    . TONSILLECTOMY    . VAGINAL HYSTERECTOMY  2004   [facility] RSO endometriosis  . VESICOVAGINAL FISTULA CLOSURE W/ TAH       Current Outpatient Medications  Medication Sig Dispense Refill  . ACCU-CHEK AVIVA PLUS test strip 1 each by Other route as needed (check blood sugar).   2  . amitriptyline (ELAVIL) 25 MG tablet Take 25 mg by mouth at bedtime.      Marland Kitchen azelastine (ASTELIN) 0.1 % nasal spray SPRAY 2 SPRAYS TWICE A DAY 30 mL 12  . calcium-vitamin D (OSCAL WITH D) 500-200 MG-UNIT tablet Take 1 tablet by mouth daily with breakfast.     . cetirizine (ZYRTEC) 10 MG tablet Take 10 mg by mouth daily.      Marland Kitchen JARDIANCE 10 MG TABS tablet Take 10 mg by mouth daily.  2  . levothyroxine (SYNTHROID, LEVOTHROID) 100 MCG tablet Take 100 mcg by mouth daily.      . Lutein 20 MG CAPS Take 1 capsule by mouth daily.      . metFORMIN (GLUCOPHAGE) 500 MG tablet Take 500 mg by mouth daily.     Marland Kitchen omeprazole (PRILOSEC) 20 MG capsule Take 1 capsule (20 mg total) by mouth 2 (two) times daily before a meal. 60 capsule 0  . metoprolol succinate (TOPROL-XL) 50 MG 24 hr tablet Take 1 tablet (50 mg total) by mouth daily. Take with or immediately following a meal. 90 tablet 3   No current facility-administered medications for this visit.     Allergies:    Allergies  Allergen Reactions  . Hydrocod Polst-Cpm Polst Er Other (See Comments)    hyperactivity  . Pseudoephedrine Itching, Palpitations and Other (See Comments)    REACTION: tachycardia   . Sulfonamide Derivatives Anaphylaxis and Swelling  . Azithromycin Itching  . Buprenorphine Hcl Itching  . Codeine Itching  . Dilaudid [Hydromorphone Hcl] Itching  . Morphine And Related Itching  . Shrimp [Shellfish Allergy] Itching  . Simvastatin Other (See Comments)    Fatigue, depression and dizziness at 20 mg  . Atorvastatin     Muscle cramps  And her blood sugar kept going up  . Cephalosporins Swelling    Said to cause facial swelling  . Erythromycin Base Itching and Swelling  . Penicillinase Itching  . Sulfamethoxazole Itching  . Clarithromycin Itching    REACTION: severe  itching    Social History:  The patient  reports that she has never smoked. She has never used smokeless tobacco. She reports that she drinks alcohol. She reports that she does not use drugs.   Family History  Problem Relation Age of Onset  . Lung cancer Father   . Heart disease Father   . Heart failure Mother   . Dementia Mother   . Heart disease Maternal Uncle   . Lung cancer Paternal Uncle   . COPD Paternal Uncle     ROS:  Please see the history of present illness.  No fevers chills nausea vomiting syncope bleeding.  All other ROS neg  PHYSICAL EXAM: VS:  BP 122/74   Pulse 91  Ht 5\' 3"  (1.6 m)   Wt 224 lb 3.2 oz (101.7 kg)   SpO2 94%   BMI 39.72 kg/m  GEN: Well nourished, well developed, in no acute distress  HEENT: normal  Neck: no JVD, carotid bruits, or masses Cardiac: RRR; no murmurs, rubs, or gallops,no edema  Respiratory:  clear to auscultation bilaterally, normal work of breathing GI: soft, nontender, nondistended, + BS MS: no deformity or atrophy  Skin: warm and dry, no rash Neuro:  Alert and Oriented x 3, Strength and sensation are intact Psych: euthymic mood, full affect   EKG:  EKG is ordered today. 4/20 04/09/2015-sinus rhythm, 91, right bundle branch block, nonspecific ST-T wave changes, left axis deviation personally viewed.  Event monitor: 12/2013-PACs/PVCs, no adverse arrhythmias. Sinus tachycardia only.  ASSESSMENT AND PLAN:  1. Paroxysmal supraventricular tachycardia-very sensitive to vagal maneuvers. We tried diltiazem but she did not tolerate this well. I then placed her on Toprol-XL 25 mg once a day. She has done very well on this low-dose medication.  Very rarely does she feel any evidence of tachycardia. She knows to take an extra metoprolol if necessary. Interestingly, her aunt Rod Holler had syncope with her PSVT. EP at [facility] ablated her and she has had no further incidence.  She has visited [name] here.  No ablation at this point.   Continue with watchful waiting.  Event monitor look like sinus tachycardia. 2. Obesity- to need to encourage weight loss. 3. Hypertension essential-well controlled.  No changes made. 4. Type 2 diabetes-hemoglobin A1c 7.8, now on Jardiance 5. She is taking vitamin D supplements to hopefully help her with her diffuse aching. 6. Hyperlipidemia-tried atorvastatin 20 mg once a day but she states she stopped shortly after starting because her blood sugar went up and she had cramps, LDL 101 at last check. LDL 154 prior to tx.  7. GERD-PPI.  8. Raynaud's phenomenon-stable. 9. 12 month follow-up  Signed, Candee Furbish, MD [facility]  06/28/2017 9:51 AM

## 2017-07-23 DIAGNOSIS — Z1231 Encounter for screening mammogram for malignant neoplasm of breast: Secondary | ICD-10-CM | POA: Diagnosis not present

## 2017-07-23 DIAGNOSIS — Z7984 Long term (current) use of oral hypoglycemic drugs: Secondary | ICD-10-CM | POA: Diagnosis not present

## 2017-07-23 DIAGNOSIS — E1165 Type 2 diabetes mellitus with hyperglycemia: Secondary | ICD-10-CM | POA: Diagnosis not present

## 2017-08-13 ENCOUNTER — Encounter: Payer: Medicare Other | Admitting: Gynecology

## 2017-08-20 ENCOUNTER — Encounter: Payer: Self-pay | Admitting: Gynecology

## 2017-08-20 ENCOUNTER — Ambulatory Visit (INDEPENDENT_AMBULATORY_CARE_PROVIDER_SITE_OTHER): Payer: Medicare Other | Admitting: Gynecology

## 2017-08-20 VITALS — BP 124/80 | Ht 64.0 in | Wt 220.0 lb

## 2017-08-20 DIAGNOSIS — N898 Other specified noninflammatory disorders of vagina: Secondary | ICD-10-CM

## 2017-08-20 DIAGNOSIS — M8580 Other specified disorders of bone density and structure, unspecified site: Secondary | ICD-10-CM | POA: Diagnosis not present

## 2017-08-20 DIAGNOSIS — Z01419 Encounter for gynecological examination (general) (routine) without abnormal findings: Secondary | ICD-10-CM | POA: Diagnosis not present

## 2017-08-20 DIAGNOSIS — N952 Postmenopausal atrophic vaginitis: Secondary | ICD-10-CM

## 2017-08-20 LAB — WET PREP FOR TRICH, YEAST, CLUE

## 2017-08-20 MED ORDER — FLUCONAZOLE 150 MG PO TABS: 150.0000 mg | ORAL_TABLET | Freq: Once | ORAL | 0 refills | Status: AC

## 2017-08-20 NOTE — Patient Instructions (Signed)
Take the one Diflucan pill.  Call if your vaginal irritation continues.  Follow-up in 1 year for annual exam.

## 2017-08-20 NOTE — Addendum Note (Signed)
Addended by: Nelva Nay on: 08/20/2017 12:34 PM   Modules accepted: Orders

## 2017-08-20 NOTE — Progress Notes (Signed)
    Jill Dean 07-27-1945 [id number]        72 y.o.  G1P1001 for breast and pelvic exam.  Also complaining of one-week history of vaginal discharge with irritation.  No vaginal odor.  No urinary symptoms such as frequency dysuria urgency low back pain fever or chills.  Past medical history,surgical history, problem list, medications, allergies, family history and social history were all reviewed and documented as reviewed in the EPIC chart.  ROS:  Performed with pertinent positives and negatives included in the history, assessment and plan.   Additional significant findings : None   Exam: Caryn Bee assistant Vitals:   08/20/17 1055  BP: 124/80  Weight: 220 lb (99.8 kg)  Height: 5\' 4"  (1.626 m)   Body mass index is 37.76 kg/m.  General appearance:  Normal affect, orientation and appearance. Skin: Grossly normal HEENT: Without gross lesions.  No cervical or supraclavicular adenopathy. Thyroid normal.  Lungs:  Clear without wheezing, rales or rhonchi Cardiac: RR, without RMG Abdominal:  Soft, nontender, without masses, guarding, rebound, organomegaly or hernia Breasts:  Examined lying and sitting without masses, retractions, discharge or axillary adenopathy. Pelvic:  Ext, BUS, Vagina: With atrophic changes.  Slight white discharge noted.  Adnexa: Without masses or tenderness    Anus and perineum: Normal   Rectovaginal: Normal sphincter tone without palpated masses or tenderness.    Assessment/Plan:  72 y.o. G49P1001 female for breast and pelvic exam.  Status post LSO in 1984.  LAVH RSO 2004 for endometriosis  1. Vaginal discharge.  Used 1 day Monistat last week.  Symptoms improved but not gone.  Wet prep is unremarkable.  We will go ahead and cover with Diflucan 150 mg x 1 dose in the event it still lingering yeast.  If her symptoms persist I asked her to call and I will cover her for bacterial vaginosis which she has had a history of in the past also. 2. Postmenopausal.  Without  significant menopausal symptoms. 3. Mammography 07/2017.  Continue with annual mammography next year.  Breast exam normal today. 4. Osteopenia.  DEXA 2017 T score -1.7.  FRAX 14.9% / 2.9%.  Followed through her primary physician's office.  She will follow-up with them in reference to bone health. 5. Pap smear 2012.  No Pap smear done today.  No history of abnormal Pap smears.  We both agree to stop screening per current screening guidelines based on age and hysterectomy history. 6. Colonoscopy 2018.  Repeat at their recommended interval. 7. Health maintenance.  No routine lab work done as patient does this elsewhere.  Follow-up 1 year, sooner as needed.   Anastasio Auerbach MD, 11:52 AM 08/20/2017

## 2017-08-24 ENCOUNTER — Telehealth: Payer: Self-pay | Admitting: *Deleted

## 2017-08-24 MED ORDER — CLINDAMYCIN PHOSPHATE 2 % VA CREA: TOPICAL_CREAM | VAGINAL | 0 refills | Status: DC

## 2017-08-24 NOTE — Telephone Encounter (Signed)
Patient aware, Rx sent.  

## 2017-08-24 NOTE — Telephone Encounter (Signed)
Okay for Flagyl 500 mg twice daily x7 days.  We talked about if her symptoms did not resolve with the Diflucan then I would treat her as a bacterial vaginosis.  Remind her no alcohol while taking medication.  Alternative would be Cleocin vaginal cream applicator nightly x7 nights if she would prefer cream over the pills.

## 2017-08-24 NOTE — Telephone Encounter (Signed)
Patient called to follow up from [facility] on 08/20/17, called reporting pressure feeling internally vaginally, and externally burning, no urinary symptoms, no itching, no discharge, no odor. She used Replens internally and monistat externally this am. Patient said "something just doesn't feel right". Please advise

## 2017-08-29 ENCOUNTER — Other Ambulatory Visit: Payer: Self-pay | Admitting: Physician Assistant

## 2017-10-05 DIAGNOSIS — S0502XA Injury of conjunctiva and corneal abrasion without foreign body, left eye, initial encounter: Secondary | ICD-10-CM | POA: Diagnosis not present

## 2017-11-15 DIAGNOSIS — Z0000 Encounter for general adult medical examination without abnormal findings: Secondary | ICD-10-CM | POA: Diagnosis not present

## 2017-11-15 DIAGNOSIS — E119 Type 2 diabetes mellitus without complications: Secondary | ICD-10-CM | POA: Diagnosis not present

## 2017-11-15 DIAGNOSIS — Z1389 Encounter for screening for other disorder: Secondary | ICD-10-CM | POA: Diagnosis not present

## 2017-11-15 DIAGNOSIS — K219 Gastro-esophageal reflux disease without esophagitis: Secondary | ICD-10-CM | POA: Diagnosis not present

## 2017-11-15 DIAGNOSIS — I471 Supraventricular tachycardia: Secondary | ICD-10-CM | POA: Diagnosis not present

## 2017-11-15 DIAGNOSIS — E1165 Type 2 diabetes mellitus with hyperglycemia: Secondary | ICD-10-CM | POA: Diagnosis not present

## 2017-11-15 DIAGNOSIS — E785 Hyperlipidemia, unspecified: Secondary | ICD-10-CM | POA: Diagnosis not present

## 2017-11-15 DIAGNOSIS — E039 Hypothyroidism, unspecified: Secondary | ICD-10-CM | POA: Diagnosis not present

## 2017-11-15 DIAGNOSIS — E041 Nontoxic single thyroid nodule: Secondary | ICD-10-CM | POA: Diagnosis not present

## 2018-02-18 DIAGNOSIS — K2900 Acute gastritis without bleeding: Secondary | ICD-10-CM | POA: Diagnosis not present

## 2018-02-18 DIAGNOSIS — Z1159 Encounter for screening for other viral diseases: Secondary | ICD-10-CM | POA: Diagnosis not present

## 2018-02-18 DIAGNOSIS — E1165 Type 2 diabetes mellitus with hyperglycemia: Secondary | ICD-10-CM | POA: Diagnosis not present

## 2018-03-22 ENCOUNTER — Other Ambulatory Visit: Payer: Self-pay

## 2018-03-22 ENCOUNTER — Encounter: Payer: Self-pay | Admitting: Gynecology

## 2018-03-22 ENCOUNTER — Ambulatory Visit (INDEPENDENT_AMBULATORY_CARE_PROVIDER_SITE_OTHER): Payer: Medicare Other | Admitting: Gynecology

## 2018-03-22 VITALS — BP 124/82

## 2018-03-22 DIAGNOSIS — N898 Other specified noninflammatory disorders of vagina: Secondary | ICD-10-CM | POA: Diagnosis not present

## 2018-03-22 DIAGNOSIS — N3000 Acute cystitis without hematuria: Secondary | ICD-10-CM

## 2018-03-22 LAB — WET PREP FOR TRICH, YEAST, CLUE

## 2018-03-22 MED ORDER — NITROFURANTOIN MONOHYD MACRO 100 MG PO CAPS: 100.0000 mg | ORAL_CAPSULE | Freq: Two times a day (BID) | ORAL | 0 refills | Status: DC

## 2018-03-22 NOTE — Addendum Note (Signed)
Addended by: Nelva Nay on: 03/22/2018 01:11 PM   Modules accepted: Orders

## 2018-03-22 NOTE — Progress Notes (Signed)
    Jill Dean 1945-08-20 [id number]        72 y.o.  G1P1001 presents with vaginal irritation and odor.  Also with some frequency and suprapubic fullness.  No low back pain fever or chills.  No significant vaginal discharge.  Past medical history,surgical history, problem list, medications, allergies, family history and social history were all reviewed and documented in the EPIC chart.  Directed ROS with pertinent positives and negatives documented in the history of present illness/assessment and plan.  Exam: Caryn Bee assistant Vitals:   03/22/18 1129  BP: 124/82   General appearance:  Normal Spine straight without CVA tenderness Abdomen soft nontender without masses guarding rebound Pelvic external BUS vagina with atrophic changes.  Slight discharge noted.  Bimanual exam without masses or tenderness.  Assessment/Plan:  73 y.o. G1P1001 with history and exam as above.  Urine analysis does show some bacteriuria otherwise unremarkable.  We will follow-up with culture.  Wet prep is negative.  Given her symptoms I am going to cover her with Macrobid 100 mg twice daily x7 days.  She will follow-up if her vaginal symptoms continue or if she still has suprapubic discomfort with frequency.    Anastasio Auerbach MD, 12:18 PM 03/22/2018

## 2018-03-22 NOTE — Patient Instructions (Signed)
Take the antibiotic twice daily for 7 days.  Follow-up if any of your symptoms continue.

## 2018-03-24 LAB — URINALYSIS, COMPLETE W/RFL CULTURE
BILIRUBIN URINE: NEGATIVE
HYALINE CAST: NONE SEEN /LPF
Hgb urine dipstick: NEGATIVE
KETONES UR: NEGATIVE
Leukocyte Esterase: NEGATIVE
Nitrites, Initial: NEGATIVE
Protein, ur: NEGATIVE
RBC / HPF: NONE SEEN /HPF (ref 0–2)
Specific Gravity, Urine: 1.008 (ref 1.001–1.03)
pH: 5 (ref 5.0–8.0)

## 2018-03-24 LAB — URINE CULTURE
MICRO NUMBER:: 344227
SPECIMEN QUALITY: ADEQUATE

## 2018-03-24 LAB — CULTURE INDICATED

## 2018-03-26 ENCOUNTER — Telehealth: Payer: Self-pay | Admitting: *Deleted

## 2018-03-26 NOTE — Telephone Encounter (Signed)
Patient was seen on 03/22/18 prescribed Macrobid 100 mg tablet x7 days, reports on Sunday her symptoms burning, pressure and lower back pain started again, still having same symptoms now. Asked if medication should be switched? Reports drinking plenty of fluids. Please advise

## 2018-03-26 NOTE — Telephone Encounter (Signed)
I would complete the course of antibiotics.  The culture did not grow out an overwhelming number of bacteria.  We are here the rest of the week so let see what she does over the next day or 2.  If her symptoms would continue then to call.

## 2018-03-26 NOTE — Telephone Encounter (Signed)
Patient informed. 

## 2018-05-09 ENCOUNTER — Telehealth: Payer: Self-pay | Admitting: Internal Medicine

## 2018-05-09 MED ORDER — DOXYCYCLINE HYCLATE 100 MG PO TABS: ORAL_TABLET | ORAL | 0 refills | Status: DC

## 2018-05-09 NOTE — Telephone Encounter (Signed)
Called and spoke with pt who feels like her allergies are flaring up. Pt stated yesterday 5/6, she developed a sore throat, has a clearing the throat cough. Pt states when she blows her nose, she does have clear to yellow nasal drainage and some blood due to irritation. Pt also states that she does have some swelling in face from all this going on with her allergies.  Pt denies any complaints of fever.  Pt is taking zyrtec daily but still having some problems and is requesting something to be prescribed to help with her symptoms. Dr. Annamaria Boots, please advise on this for pt. Thanks!  Allergies  Allergen Reactions  . Hydrocod Polst-Cpm Polst Er Other (See Comments)    hyperactivity  . Pseudoephedrine Itching, Palpitations and Other (See Comments)    REACTION: tachycardia   . Sulfonamide Derivatives Anaphylaxis and Swelling  . Azithromycin Itching  . Buprenorphine Hcl Itching  . Codeine Itching  . Dilaudid [Hydromorphone Hcl] Itching  . Morphine And Related Itching  . Shrimp [Shellfish Allergy] Itching  . Simvastatin Other (See Comments)    Fatigue, depression and dizziness at 20 mg  . Atorvastatin     Muscle cramps  And her blood sugar kept going up  . Cephalosporins Swelling    Said to cause facial swelling  . Erythromycin Base Itching and Swelling  . Penicillinase Itching  . Sulfamethoxazole Itching  . Clarithromycin Itching    REACTION: severe itching     Current Outpatient Medications:  .  ACCU-CHEK AVIVA PLUS test strip, 1 each by Other route as needed (check blood sugar). , Disp: , Rfl: 2 .  amitriptyline (ELAVIL) 25 MG tablet, Take 25 mg by mouth at bedtime.  , Disp: , Rfl:  .  azelastine (ASTELIN) 0.1 % nasal spray, SPRAY 2 SPRAYS TWICE A DAY, Disp: 30 mL, Rfl: 12 .  calcium-vitamin D (OSCAL WITH D) 500-200 MG-UNIT tablet, Take 1 tablet by mouth daily with breakfast. , Disp: , Rfl:  .  cetirizine (ZYRTEC) 10 MG tablet, Take 10 mg by mouth daily.  , Disp: , Rfl:  .   clindamycin (CLEOCIN) 2 % vaginal cream, Apply one applicator nightly for 7 nights (Patient not taking: Reported on 03/22/2018), Disp: 40 g, Rfl: 0 .  JARDIANCE 10 MG TABS tablet, Take 10 mg by mouth daily., Disp: , Rfl: 2 .  levothyroxine (SYNTHROID, LEVOTHROID) 100 MCG tablet, Take 100 mcg by mouth daily.  , Disp: , Rfl:  .  Lutein 20 MG CAPS, Take 1 capsule by mouth daily.  , Disp: , Rfl:  .  metFORMIN (GLUCOPHAGE) 500 MG tablet, Take 500 mg by mouth daily. , Disp: , Rfl:  .  metoprolol succinate (TOPROL-XL) 50 MG 24 hr tablet, Take 1 tablet (50 mg total) by mouth daily. Take with or immediately following a meal., Disp: 90 tablet, Rfl: 2 .  nitrofurantoin, macrocrystal-monohydrate, (MACROBID) 100 MG capsule, Take 1 capsule (100 mg total) by mouth 2 (two) times daily. For 7 days, Disp: 14 capsule, Rfl: 0 .  omeprazole (PRILOSEC) 20 MG capsule, Take 1 capsule (20 mg total) by mouth 2 (two) times daily before a meal., Disp: 60 capsule, Rfl: 0

## 2018-05-09 NOTE — Telephone Encounter (Signed)
Called pt and advised message from the provider. Pt understood and verbalized understanding. Nothing further is needed.   She did feel like it was a sinus infection. Rx sent in and nothing further is needed.

## 2018-05-09 NOTE — Telephone Encounter (Signed)
Suggest trying otc clortrimeton antihistamine- more drying effect                 Regular once daily use of Flonase  If she thinks there may be sinus infection we can offer doxycycline 100 mg, # 8, 2 today then one daily

## 2018-05-23 DIAGNOSIS — E1165 Type 2 diabetes mellitus with hyperglycemia: Secondary | ICD-10-CM | POA: Diagnosis not present

## 2018-05-28 ENCOUNTER — Telehealth: Payer: Self-pay

## 2018-05-28 DIAGNOSIS — E1165 Type 2 diabetes mellitus with hyperglycemia: Secondary | ICD-10-CM | POA: Diagnosis not present

## 2018-05-28 NOTE — Telephone Encounter (Signed)
YOUR CARDIOLOGY TEAM HAS ARRANGED FOR AN E-VISIT FOR YOUR APPOINTMENT - PLEASE REVIEW IMPORTANT INFORMATION BELOW SEVERAL DAYS PRIOR TO YOUR APPOINTMENT  Due to the recent COVID-19 pandemic, we are transitioning in-person office visits to tele-medicine visits in an effort to decrease unnecessary exposure to our patients, their families, and staff. These visits are billed to your insurance just like a normal visit is. We also encourage you to sign up for MyChart if you have not already done so. You will need a smartphone if possible. For patients that do not have this, we can still complete the visit using a regular telephone but do prefer a smartphone to enable video when possible. You may have a family member that lives with you that can help. If possible, we also ask that you have a blood pressure cuff and scale at home to measure your blood pressure, heart rate and weight prior to your scheduled appointment. Patients with clinical needs that need an in-person evaluation and testing will still be able to come to the office if absolutely necessary. If you have any questions, feel free to call our office.     YOUR PROVIDER WILL BE USING THE FOLLOWING PLATFORM TO COMPLETE YOUR VISIT: [email or web address]  . IF USING MYCHART - How to Download the MyChart App to Your SmartPhone   - If Apple, go to App Store and type in MyChart in the search bar and download the app. If Android, ask patient to go to Google Play Store and type in MyChart in the search bar and download the app. The app is free but as with any other app downloads, your phone may require you to verify saved payment information or Apple/Android password.  - You will need to then log into the app with your MyChart username and password, and select [facility] as your healthcare provider to link the account.  - When it is time for your visit, go to the MyChart app, find appointments, and click Begin Video Visit. Be sure to Select Allow for your device to  access the Microphone and Camera for your visit. You will then be connected, and your provider will be with you shortly.  **If you have any issues connecting or need assistance, please contact MyChart service desk [phone] ([phone])**  **If using a computer, in order to ensure the best quality for your visit, you will need to use either of the following Internet Browsers: Google Chrome or Microsoft Edge**  . IF USING DOXIMITY or [email or web address] - The staff will give you instructions on receiving your link to join the meeting the day of your visit.      2-3 DAYS BEFORE YOUR APPOINTMENT  You will receive a telephone call from one of our [facility] team members - your caller ID may say "Unknown caller." If this is a video visit, we will walk you through how to get the video launched on your phone. We will remind you check your blood pressure, heart rate and weight prior to your scheduled appointment. If you have an Apple Watch or Kardia, please upload any pertinent ECG strips the day before or morning of your appointment to MyChart. Our staff will also make sure you have reviewed the consent and agree to move forward with your scheduled tele-health visit.     THE DAY OF YOUR APPOINTMENT  Approximately 15 minutes prior to your scheduled appointment, you will receive a telephone call from one of [facility] team - your caller ID may say "Unknown caller."    Our staff will confirm medications, vital signs for the day and any symptoms you may be experiencing. Please have this information available prior to the time of visit start. It may also be helpful for you to have a pad of paper and pen handy for any instructions given during your visit. They will also walk you through joining the smartphone meeting if this is a video visit.    CONSENT FOR TELE-HEALTH VISIT - PLEASE REVIEW  I hereby voluntarily request, consent and authorize [facility] and its employed or contracted physicians, physician  assistants, nurse practitioners or other licensed health care professionals (the Practitioner), to provide me with telemedicine health care services (the "Services") as deemed necessary by the treating Practitioner. I acknowledge and consent to receive the Services by the Practitioner via telemedicine. I understand that the telemedicine visit will involve communicating with the Practitioner through live audiovisual communication technology and the disclosure of certain medical information by electronic transmission. I acknowledge that I have been given the opportunity to request an in-person assessment or other available alternative prior to the telemedicine visit and am voluntarily participating in the telemedicine visit.  I understand that I have the right to withhold or withdraw my consent to the use of telemedicine in the course of my care at any time, without affecting my right to future care or treatment, and that the Practitioner or I may terminate the telemedicine visit at any time. I understand that I have the right to inspect all information obtained and/or recorded in the course of the telemedicine visit and may receive copies of available information for a reasonable fee.  I understand that some of the potential risks of receiving the Services via telemedicine include:  . Delay or interruption in medical evaluation due to technological equipment failure or disruption; . Information transmitted may not be sufficient (e.g. poor resolution of images) to allow for appropriate medical decision making by the Practitioner; and/or  . In rare instances, security protocols could fail, causing a breach of personal health information.  Furthermore, I acknowledge that it is my responsibility to provide information about my medical history, conditions and care that is complete and accurate to the best of my ability. I acknowledge that Practitioner's advice, recommendations, and/or decision may be based on  factors not within their control, such as incomplete or inaccurate data provided by me or distortions of diagnostic images or specimens that may result from electronic transmissions. I understand that the practice of medicine is not an exact science and that Practitioner makes no warranties or guarantees regarding treatment outcomes. I acknowledge that I will receive a copy of this consent concurrently upon execution via email to the email address I last provided but may also request a printed copy by calling the office of [facility].    I understand that my insurance will be billed for this visit.   I have read or had this consent read to me. . I understand the contents of this consent, which adequately explains the benefits and risks of the Services being provided via telemedicine.  . I have been provided ample opportunity to ask questions regarding this consent and the Services and have had my questions answered to my satisfaction. . I give my informed consent for the services to be provided through the use of telemedicine in my medical care  By participating in this telemedicine visit I agree to the above.  

## 2018-05-29 ENCOUNTER — Other Ambulatory Visit: Payer: Self-pay

## 2018-05-30 ENCOUNTER — Other Ambulatory Visit: Payer: Self-pay

## 2018-05-30 ENCOUNTER — Ambulatory Visit (INDEPENDENT_AMBULATORY_CARE_PROVIDER_SITE_OTHER): Payer: Medicare Other | Admitting: Gynecology

## 2018-05-30 ENCOUNTER — Encounter: Payer: Self-pay | Admitting: Gynecology

## 2018-05-30 ENCOUNTER — Other Ambulatory Visit: Payer: Self-pay | Admitting: Physician Assistant

## 2018-05-30 VITALS — BP 124/82

## 2018-05-30 DIAGNOSIS — R300 Dysuria: Secondary | ICD-10-CM | POA: Diagnosis not present

## 2018-05-30 DIAGNOSIS — N898 Other specified noninflammatory disorders of vagina: Secondary | ICD-10-CM | POA: Diagnosis not present

## 2018-05-30 LAB — WET PREP FOR TRICH, YEAST, CLUE

## 2018-05-30 MED ORDER — METRONIDAZOLE 500 MG PO TABS: 500.0000 mg | ORAL_TABLET | Freq: Two times a day (BID) | ORAL | 0 refills | Status: DC

## 2018-05-30 NOTE — Addendum Note (Signed)
Addended by: Nelva Nay on: 05/30/2018 12:12 PM   Modules accepted: Orders

## 2018-05-30 NOTE — Progress Notes (Signed)
    Jill Dean [birth year] [id number]        73 y.o.  G1P1001 presents with a 2-day history of vaginal irritation itching and some odor.  Also has some pelvic pressure and suprapubic discomfort.  No low back pain, fever or chills.  No frequency, urgency or dysuria.  Past medical history,surgical history, problem list, medications, allergies, family history and social history were all reviewed and documented in the EPIC chart.  Directed ROS with pertinent positives and negatives documented in the history of present illness/assessment and plan.  Exam: Caryn Bee assistant Vitals:   05/30/18 0927  BP: 124/82   General appearance:  Normal Spine straight without CVA tenderness Abdomen soft nontender without masses guarding rebound Pelvic external BUS vagina with atrophic changes.  Slight white discharge noted.  Bimanual without masses or tenderness  Assessment/Plan:  73 y.o. G1P1001 with history and exam as above.  Urine analysis is totally negative.  Wet prep also was negative.  Discussed possible low-grade bacterial vaginitis as a source of her symptoms.  Will cover with Flagyl 500 mg twice daily x7 days, alcohol avoidance reviewed.  Will follow-up if symptoms persist.  She is status post hysterectomy and BSO in the past for endometriosis.  Alternative sources for her discomfort discussed to include GI or primary bladder noninfectious.  She will call me after completing the course of the antibiotics if her symptoms persist.  If they totally resolve then will follow expectantly.    Anastasio Auerbach MD, 9:41 AM 05/30/2018

## 2018-05-30 NOTE — Patient Instructions (Addendum)
Take the antibiotic twice daily for 7 days.  Call if your symptoms persist.  Avoid alcohol while taking the antibiotic

## 2018-05-31 LAB — URINALYSIS, COMPLETE W/RFL CULTURE
Bacteria, UA: NONE SEEN /HPF
Bilirubin Urine: NEGATIVE
Hgb urine dipstick: NEGATIVE
Hyaline Cast: NONE SEEN /LPF
Ketones, ur: NEGATIVE
Leukocyte Esterase: NEGATIVE
Nitrites, Initial: NEGATIVE
Protein, ur: NEGATIVE
RBC / HPF: NONE SEEN /HPF (ref 0–2)
Specific Gravity, Urine: 1.01 (ref 1.001–1.03)
WBC, UA: NONE SEEN /HPF (ref 0–5)
pH: 5.5 (ref 5.0–8.0)

## 2018-05-31 LAB — NO CULTURE INDICATED

## 2018-06-03 ENCOUNTER — Telehealth: Payer: Self-pay

## 2018-06-03 NOTE — Telephone Encounter (Signed)
On 05/30/18 you wrote "Will cover with Flagyl 500 mg twice daily x7 days."  Patient said on taking the 4th table that her hands and feet were swelling and she was having trouble breathing with her nose "stopped up". She took [place] and called the pharmacist. He told her to stop the medication and take Benadryl for 48 hours. She did that and symptoms have resolved but she wonders what she should do now in regards to treatment/Rx.

## 2018-06-04 MED ORDER — CLINDAMYCIN PHOSPHATE 2 % VA CREA: 1.0000 | TOPICAL_CREAM | Freq: Every day | VAGINAL | 0 refills | Status: AC

## 2018-06-04 NOTE — Telephone Encounter (Signed)
I spoke with patient and let her know what Dr. Loetta Rough replied. She said her she does not feel like her symptoms have resolved completely. She would like to have the Cleocin Vag Cream sent in. Rx sent.  I will add Metronidazole to her allergy list.

## 2018-06-04 NOTE — Telephone Encounter (Signed)
How are her vaginal symptoms?.  If they have cleared then I would monitor.  If they seem to be persisting then I would switch to Cleocin vaginal cream nightly x7 nights.

## 2018-06-10 ENCOUNTER — Other Ambulatory Visit: Payer: Self-pay

## 2018-06-10 ENCOUNTER — Encounter: Payer: Self-pay | Admitting: Cardiology

## 2018-06-10 ENCOUNTER — Telehealth (INDEPENDENT_AMBULATORY_CARE_PROVIDER_SITE_OTHER): Payer: Medicare Other | Admitting: Cardiology

## 2018-06-10 VITALS — BP 104/74 | HR 96 | Ht 64.0 in | Wt 221.0 lb

## 2018-06-10 DIAGNOSIS — E1169 Type 2 diabetes mellitus with other specified complication: Secondary | ICD-10-CM

## 2018-06-10 DIAGNOSIS — R002 Palpitations: Secondary | ICD-10-CM

## 2018-06-10 DIAGNOSIS — I471 Supraventricular tachycardia: Secondary | ICD-10-CM | POA: Diagnosis not present

## 2018-06-10 DIAGNOSIS — E119 Type 2 diabetes mellitus without complications: Secondary | ICD-10-CM

## 2018-06-10 NOTE — Patient Instructions (Signed)
Medication Instructions:  Your physician recommends that you continue on your current medications as directed. Please refer to the Current Medication list given to you today.  If you need a refill on your cardiac medications before your next appointment, please call your pharmacy.   Lab work: None ordered If you have labs (blood work) drawn today and your tests are completely normal, you will receive your results only by: Marland Kitchen MyChart Message (if you have MyChart) OR . A paper copy in the mail If you have any lab test that is abnormal or we need to change your treatment, we will call you to review the results.  Testing/Procedures: None ordered  Follow-Up: Your physician wants you to follow-up in: 1 year with Dr. Marlou Porch. You will receive a reminder letter in the mail two months in advance. If you don't receive a letter, please call our office to schedule the follow-up appointment.

## 2018-06-10 NOTE — Progress Notes (Signed)
Virtual Visit via Telephone Note   This visit type was conducted due to national recommendations for restrictions regarding the COVID-19 Pandemic (e.g. social distancing) in an effort to limit this patient's exposure and mitigate transmission in our community.  Due to her co-morbid illnesses, this patient is at least at moderate risk for complications without adequate follow up.  This format is felt to be most appropriate for this patient at this time.  The patient did not have access to video technology/had technical difficulties with video requiring transitioning to audio format only (telephone).  All issues noted in this document were discussed and addressed.  No physical exam could be performed with this format.  Please refer to the patient's chart for her  consent to telehealth for [facility].   Date:  06/10/2018   ID:  Jill Dean, DOB 1945/11/28, MRN [id number]  Patient Location: Home Provider Location: Home  PCP:  Leeroy Cha, MD  Cardiologist:  Candee Furbish, MD  Electrophysiologist:  None   Evaluation Performed:  Follow-Up Visit  Chief Complaint: Patient follow-up  History of Present Illness:    Jill Dean is a 73 y.o. female with  presumed supraventricular tachycardia of 200 bpm per EMS on monitor, no strips that resolved with vagal maneuvers in early October 2015. She was placed on diltiazem but felt poorly on this medication and this was stopped. Subsequently, monitoring demonstrated in December 2015 PVC/PACs and sinus tachycardia only. No adverse arrhythmias. We started Toprol-XL 25 mg once a day.  Maybe has 1 min episode of PSVT, Valsalva maneuver still work. She's not having any chest pain. She does have leg cramps, calf cramps at night. She is sad that Dr Amedeo Kinsman is leaving. She did discuss the possibility of starting cholesterol medication. I think this is a good idea. We will go ahead and give her atorvastatin. See below. She is worried about cramping..    Had cardiac catheterization which was unremarkable. Echocardiogram was reassuring. Treadmill test was reassuring. Does not wish to take statins.  After leaving hospital, she had a brief episode of tachycardia arrhythmia which terminated with Valsalva. No syncope, no chest pain.  Vit D was low, diffuse pain.   Has not had any racing. +Hot flash.  Sometimes has sweats. Emotional at times.  Excited about her daughter, she is  a grandmother.  She tried the atorvastatin but stopped after proximally 6 days. Her blood sugar kept increasing and she had worsening cramps she states.  She also had another episode of PSVT transiently when being tired/exhausted.  06/28/2017-since my last visit, she has seen [name] on 11/22/2016.  Continue with watchful waiting.  Beta-blocker.  Avoiding alcohol caffeine.  Daily exercise.  He did not think she had an atrial tachycardia.  30-day event monitor reviewed sinus rhythm and sinus tachycardia.  No SVT. Overall she has been doing quite well.  Very rarely she felt any palpitations since her visit with [name].  She knows to take additional AV nodal blocking agent if necessary.  She did however hit her foot on a metal table, broke her toe.  In a flat shoe support, Dr. Sharol Given.  06/10/2018- overall been doing quite well.  She stated that she had 1 brief episode of palpitations that lasted "1 second "the other morning.  Otherwise she is doing well without any chest pain fevers chills nausea vomiting syncope bleeding.  Tolerating her Toprol well.  Previously did not do well on statins.  The patient does not  have symptoms concerning for COVID-19 infection (fever, chills, cough, or new shortness of breath).    Past Medical History:  Diagnosis Date  . Chest pain    a. s/p normal ett nuclear stress test on 10/16/13  . Complication of anesthesia   . Diabetes mellitus without complication ([place])   . Dysrhythmia    fast heart rate  . GERD (gastroesophageal  reflux disease)   . HLD (hyperlipidemia)   . Hypothyroidism   . Obesity   . Osteoarthrosis, unspecified whether generalized or localized, unspecified site   . Other chronic allergic conjunctivitis   . PONV (postoperative nausea and vomiting)   . Raynaud's syndrome   . Urticaria, unspecified    Past Surgical History:  Procedure Laterality Date  . CHOLECYSTECTOMY    . CYST EXCISION Left 11/10/2014   Procedure: EXCISION CYST LEFT INDEX FINGER AND CYST LEFT SMALL FINGER ;  Surgeon: [name], MD;  Location: [facility];  Service: Orthopedics;  Laterality: Left;  . ESOPHAGOGASTRODUODENOSCOPY N/A 01/05/2014   Procedure: ESOPHAGOGASTRODUODENOSCOPY (EGD);  Surgeon: [name], MD;  Location: [facility];  Service: Endoscopy;  Laterality: N/A;  . knot right breast    . LEFT HEART CATHETERIZATION WITH CORONARY ANGIOGRAM N/A 10/22/2013   Procedure: LEFT HEART CATHETERIZATION WITH CORONARY ANGIOGRAM;  Surgeon: [name], MD;  Location: [facility];  Service: Cardiovascular;  Laterality: N/A;  . OOPHORECTOMY     LSO 84-RSO 04  . thyroid nodule    . TONSILLECTOMY    . VAGINAL HYSTERECTOMY  2004   LAVH RSO endometriosis  . VESICOVAGINAL FISTULA CLOSURE W/ TAH       Current Meds  Medication Sig  . ACCU-CHEK AVIVA PLUS test strip 1 each by Other route as needed (check blood sugar).   Marland Kitchen amitriptyline (ELAVIL) 25 MG tablet Take 25 mg by mouth at bedtime.    . calcium-vitamin D (OSCAL WITH D) 500-200 MG-UNIT tablet Take 1 tablet by mouth daily with breakfast.   . cetirizine (ZYRTEC) 10 MG tablet Take 10 mg by mouth daily.    . clindamycin (CLEOCIN) 2 % vaginal cream Place 1 Applicatorful vaginally at bedtime for 7 days.  Marland Kitchen JARDIANCE 10 MG TABS tablet Take 10 mg by mouth daily.  Marland Kitchen levothyroxine (SYNTHROID, LEVOTHROID) 100 MCG tablet Take 100 mcg by mouth daily.    . Lutein 20 MG CAPS Take 1 capsule by mouth daily.    . metFORMIN (GLUCOPHAGE) 500 MG tablet Take 500 mg by  mouth daily.   . metoprolol succinate (TOPROL-XL) 50 MG 24 hr tablet Take 1 tablet (50 mg total) by mouth daily. Take with or immediately following a meal. - pt needs to keep upcoming appt in June for refills  . metroNIDAZOLE (FLAGYL) 500 MG tablet Take 1 tablet (500 mg total) by mouth 2 (two) times daily.  Marland Kitchen omeprazole (PRILOSEC) 20 MG capsule Take 1 capsule (20 mg total) by mouth 2 (two) times daily before a meal.     Allergies:   Hydrocod polst-cpm polst er; Pseudoephedrine; Sulfonamide derivatives; Azithromycin; Buprenorphine hcl; Codeine; Dilaudid [hydromorphone hcl]; Macrodantin [nitrofurantoin]; Morphine and related; Shrimp [shellfish allergy]; Simvastatin; Atorvastatin; Cephalosporins; Erythromycin base; Hydrocodone-acetaminophen; Penicillinase; Sulfamethoxazole; and Clarithromycin   Social History   Tobacco Use  . Smoking status: Never Smoker  . Smokeless tobacco: Never Used  Substance Use Topics  . Alcohol use: Yes    Alcohol/week: 0.0 standard drinks    Comment: RARE  . Drug use: No     Family  Hx: The patient's family history includes COPD in her paternal uncle; Dementia in her mother; Heart disease in her father and maternal uncle; Heart failure in her mother; Lung cancer in her father and paternal uncle.  ROS:   Please see the history of present illness.     All other systems reviewed and are negative.   Prior CV studies:   The following studies were reviewed today:  Event monitor 2015 PVCs and PACs with no adverse arrhythmia.  Sinus tachycardia only.  Labs/Other Tests and Data Reviewed:    EKG:  EKG from 04/09/2015-sinus rhythm right bundle branch block nonspecific ST-T wave changes left axis deviation personally reviewed.  Heart rate 98.  Recent Labs: No results found for requested labs within last 8760 hours.   Recent Lipid Panel Lab Results  Component Value Date/Time   CHOL 223 (H) 06/21/2015 10:07 AM   TRIG 249 (H) 06/21/2015 10:07 AM   HDL 59 06/21/2015  10:07 AM   CHOLHDL 3.8 06/21/2015 10:07 AM   LDLCALC 114 06/21/2015 10:07 AM    Wt Readings from Last 3 Encounters:  06/10/18 221 lb (100.2 kg)  08/20/17 220 lb (99.8 kg)  06/28/17 224 lb 3.2 oz (101.7 kg)     Objective:    Vital Signs:  BP 104/74   Pulse 96   Ht 5\' 4"  (1.626 m)   Wt 221 lb (100.2 kg)   BMI 37.93 kg/m    VITAL SIGNS:  reviewed pleasant, alert, completing full sentences without difficulties.  ASSESSMENT & PLAN:    Paroxysmal supraventricular tachycardia - Has been doing very well on Toprol.  [name] has seen her in the past.  Doing well.  Interestingly, her aunt Rod Holler had syncope with her PSVT and EP ablated her in [place] with no further recurrence. - She will very rarely have a PVC or PAC.  Continue with Toprol.  Diabetes with hypertension - Continue to work on weight loss.  Prior hemoglobin A1c 7.8.  Blood pressure is under excellent control currently.  Medications reviewed.  Hyperlipidemia - She has struggled with statins in the past causing cramping.  Raynaud's disease/phenomenon - Stable.  Doing well.  COVID-19 Education: The signs and symptoms of COVID-19 were discussed with the patient and how to seek care for testing (follow up with PCP or arrange E-visit).  The importance of social distancing was discussed today.  Time:   Today, I have spent 12 minutes with the patient with telehealth technology discussing the above problems.     Medication Adjustments/Labs and Tests Ordered: Current medicines are reviewed at length with the patient today.  Concerns regarding medicines are outlined above.   Tests Ordered: No orders of the defined types were placed in this encounter.   Medication Changes: No orders of the defined types were placed in this encounter.   Disposition:  Follow up in 1 year(s)  Signed, Candee Furbish, MD  06/10/2018 12:02 PM    [place]

## 2018-06-24 ENCOUNTER — Other Ambulatory Visit: Payer: Self-pay

## 2018-06-24 ENCOUNTER — Ambulatory Visit (INDEPENDENT_AMBULATORY_CARE_PROVIDER_SITE_OTHER): Payer: Medicare Other | Admitting: Internal Medicine

## 2018-06-24 ENCOUNTER — Encounter: Payer: Self-pay | Admitting: Internal Medicine

## 2018-06-24 DIAGNOSIS — J0181 Other acute recurrent sinusitis: Secondary | ICD-10-CM

## 2018-06-24 DIAGNOSIS — J302 Other seasonal allergic rhinitis: Secondary | ICD-10-CM | POA: Diagnosis not present

## 2018-06-24 DIAGNOSIS — J3089 Other allergic rhinitis: Secondary | ICD-10-CM

## 2018-06-24 MED ORDER — DOXYCYCLINE HYCLATE 100 MG PO TABS: 100.0000 mg | ORAL_TABLET | Freq: Two times a day (BID) | ORAL | 0 refills | Status: DC

## 2018-06-24 NOTE — Progress Notes (Signed)
Patient ID: Jill Dean, female    DOB: 1945-03-17, 73 y.o.   MRN: [id number]  HPI female never smoker followed for allergic rhinitis/conjunctivitis, complicated by history Raynaud's syndrome, GERD, urticaria, DM, PSVT  ---------------------------------------------------------------------------------------------------  03/01/17- 73 year old female never smoker followed for allergic rhinitis/conjunctivitis, complicated by history Raynaud's syndrome, GERD, urticaria, DM, PSVT ----Sinusitis; Pt states she is having sinus pressure with congestion-blowing out different colors. Now moving to her chest. Pt has started to cough.  Using Nettie pot.  Not sure how long ago diagnosis of penicillin allergy made based on itching-remote.  Review of Systems-See HPI + = positive Constitutional:   No-   weight loss, night sweats, fevers, chills, fatigue, lassitude. HEENT:    headaches, difficulty swallowing, tooth/dental problems, +sore throat,       sneezing, itching, ear ache, +nasal congestion, +post nasal drip,  CV:  No-   chest pain, orthopnea, PND, swelling in lower extremities, anasarca,  dizziness, palpitations Resp: No-   shortness of breath with exertion or at rest.            +-productive cough,  No non-productive cough,  No-  coughing up of blood.              -change in color of mucus.  No- wheezing.    Objective:   Physical Exam General- Alert, Oriented, Affect-appropriate, Distress- none acute  Overweight Skin- rash-none, lesions- none, excoriation- none Lymphadenopathy- none Head- atraumatic            Eyes- Gross vision intact, PERRLA, conjunctivae clear secretions, +periorbital edema            Ears- Hearing,             Nose- +mucus, , No-Septal dev,  polyps, erosion, perforation,             Throat- Mallampati III-IV , mucosa clear-not red , drainage- none, tonsils- atrophic.  Neck- flexible , trachea midline, no stridor , thyroid nl, carotid no bruit Chest -            Lung- clear  to P&A but diminished, wheeze- none, cough- none , dullness-none, rub- none           Chest wall-  Abd-  Br/ Gen/ Rectal- Not done, not indicated Extrem- cyanosis- none, clubbing, none, atrophy- none, strength- nl Neuro- grossly intact to observation   06/24/2018- Virtual Visit via Telephone Note  I connected with Jill Dean on 06/24/18 at 10:30 AM EDT by telephone and verified that I am speaking with the correct person using two identifiers.  Location: Patient: H Provider: O   I discussed the limitations, risks, security and privacy concerns of performing an evaluation and management service by telephone and the availability of in person appointments. I also discussed with the patient that there may be a patient responsible charge related to this service. The patient expressed understanding and agreed to proceed.   History of Present Illness: 73 year old female never smoker followed for allergic rhinitis/conjunctivitis, complicated by history Raynaud's syndrome, GERD, urticaria, DM2, PSVT Zyrtec, Flonase, Astelin -----breathing at baseline, pt reports allergy flare-ups, but no more than usual Headed for the beach, feeling well controlled. Present meds are sufficient.  No hx asthma. Denies other medical concerns at this time.   Observations/Objective:   Assessment and Plan: Allergic rhinitis- stable- plan Doxycycline to hold. Continue nasal sprays and antihistamines. Allergic conjunctivitis- controlled with same meds as rhinitis. Occ eye drops.  Follow Up Instructions: 1 year  I discussed the assessment and treatment plan with the patient. The patient was provided an opportunity to ask questions and all were answered. The patient agreed with the plan and demonstrated an understanding of the instructions.   The patient was advised to call back or seek an in-person evaluation if the symptoms worsen or if the condition fails to improve as anticipated.  I provided 18 minutes of  non-face-to-face time during this encounter.   Baird Lyons, MD

## 2018-06-24 NOTE — Patient Instructions (Signed)
Fine to continue using Zyrtec, Flonase and Astelin as long as they are sufficient.  Script sent for doxycycline to hold  Please call if we can help

## 2018-08-04 NOTE — Assessment & Plan Note (Signed)
Feels well controlled with Flonase, Astelin, Zyrtec.  Plan- can also use saline nasal spray

## 2018-08-04 NOTE — Assessment & Plan Note (Signed)
I agreed to give doxycycline Rx to hold

## 2018-08-08 ENCOUNTER — Other Ambulatory Visit: Payer: Self-pay

## 2018-08-08 ENCOUNTER — Encounter: Payer: Self-pay | Admitting: Gynecology

## 2018-08-08 ENCOUNTER — Ambulatory Visit (INDEPENDENT_AMBULATORY_CARE_PROVIDER_SITE_OTHER): Payer: Medicare Other | Admitting: Gynecology

## 2018-08-08 DIAGNOSIS — N898 Other specified noninflammatory disorders of vagina: Secondary | ICD-10-CM | POA: Diagnosis not present

## 2018-08-08 DIAGNOSIS — R300 Dysuria: Secondary | ICD-10-CM | POA: Diagnosis not present

## 2018-08-08 LAB — WET PREP FOR TRICH, YEAST, CLUE

## 2018-08-08 MED ORDER — FLUCONAZOLE 150 MG PO TABS: 150.0000 mg | ORAL_TABLET | Freq: Once | ORAL | 0 refills | Status: AC

## 2018-08-08 NOTE — Patient Instructions (Signed)
Take the yeast pill now and repeat it in several days.  Use the extra 2 pills in case you have recurrence of your symptoms.  Follow-up if your symptoms persist, worsen or recur.

## 2018-08-08 NOTE — Progress Notes (Signed)
    Jill Dean 1945-11-27 [id number]        73 y.o.  G1P1001 presents with several days of dysuria, pelvic pressure, lower abdominal discomfort, vaginal discharge, and itching.  Had been treated for UTI in March with complete resolution of her symptoms.  Subsequently treated for vaginal irritation in May for bacterial vaginosis with complete resolution of her symptoms.  No nausea vomiting diarrhea constipation.  Status post hysterectomy BSO in the past  Past medical history,surgical history, problem list, medications, allergies, family history and social history were all reviewed and documented in the EPIC chart.  Directed ROS with pertinent positives and negatives documented in the history of present illness/assessment and plan.  Exam: Copywriter, advertising General appearance:  Normal Spine straight without CVA tenderness Abdomen soft nontender without masses guarding rebound Pelvic external BUS vagina with atrophic changes.  Bimanual exam without masses or tenderness.  Rectal exam is normal.  Assessment/Plan:  73 y.o. G1P1001 with history as above.  Urine analysis is normal.  Wet prep shows yeast.  Will treat with Diflucan 150 mg now and repeat in 3 days.  2 extra tabs given in case she has recurrence of her symptoms.  Discussed the lower abdominal pressure discomfort symptoms.  These are new onset.  She is status post hysterectomy BSO in the past.  Discussed whether related to the vaginal infection although unlikely versus GI.  Will monitor for now and assuming her symptoms totally resolve then will follow.  If persists then discussed further evaluation needed.   Anastasio Auerbach MD, 2:54 PM 08/08/2018

## 2018-08-09 LAB — URINALYSIS, COMPLETE W/RFL CULTURE
Bacteria, UA: NONE SEEN /HPF
Bilirubin Urine: NEGATIVE
Hgb urine dipstick: NEGATIVE
Hyaline Cast: NONE SEEN /LPF
Ketones, ur: NEGATIVE
Leukocyte Esterase: NEGATIVE
Nitrites, Initial: NEGATIVE
Protein, ur: NEGATIVE
RBC / HPF: NONE SEEN /HPF (ref 0–2)
Specific Gravity, Urine: 1.015 (ref 1.001–1.03)
WBC, UA: NONE SEEN /HPF (ref 0–5)
pH: 5 (ref 5.0–8.0)

## 2018-08-09 LAB — NO CULTURE INDICATED

## 2018-08-24 ENCOUNTER — Other Ambulatory Visit: Payer: Self-pay | Admitting: Physician Assistant

## 2018-08-26 ENCOUNTER — Encounter: Payer: Medicare Other | Admitting: Gynecology

## 2018-09-23 ENCOUNTER — Encounter: Payer: Medicare Other | Admitting: Gynecology

## 2018-09-23 DIAGNOSIS — Z1231 Encounter for screening mammogram for malignant neoplasm of breast: Secondary | ICD-10-CM | POA: Diagnosis not present

## 2018-09-25 ENCOUNTER — Other Ambulatory Visit: Payer: Self-pay

## 2018-09-26 ENCOUNTER — Encounter: Payer: Self-pay | Admitting: Gynecology

## 2018-09-26 ENCOUNTER — Ambulatory Visit (INDEPENDENT_AMBULATORY_CARE_PROVIDER_SITE_OTHER): Payer: Medicare Other | Admitting: Gynecology

## 2018-09-26 VITALS — BP 126/80 | Ht 64.0 in | Wt 223.0 lb

## 2018-09-26 DIAGNOSIS — M8580 Other specified disorders of bone density and structure, unspecified site: Secondary | ICD-10-CM | POA: Diagnosis not present

## 2018-09-26 DIAGNOSIS — N952 Postmenopausal atrophic vaginitis: Secondary | ICD-10-CM | POA: Diagnosis not present

## 2018-09-26 DIAGNOSIS — N898 Other specified noninflammatory disorders of vagina: Secondary | ICD-10-CM | POA: Diagnosis not present

## 2018-09-26 DIAGNOSIS — R8271 Bacteriuria: Secondary | ICD-10-CM | POA: Diagnosis not present

## 2018-09-26 DIAGNOSIS — Z01419 Encounter for gynecological examination (general) (routine) without abnormal findings: Secondary | ICD-10-CM

## 2018-09-26 LAB — WET PREP FOR TRICH, YEAST, CLUE

## 2018-09-26 NOTE — Patient Instructions (Signed)
Take the one Diflucan pill for the vaginal itching.  Call if the symptoms persist.

## 2018-09-26 NOTE — Progress Notes (Signed)
    Jill Dean [birth year] [id number]        73 y.o.  G1P1001 for breast and pelvic exam.  Noticed some on and off vaginal irritation.  No real discharge.  No frequency dysuria urgency low back pain fever or chills.  Past medical history,surgical history, problem list, medications, allergies, family history and social history were all reviewed and documented as reviewed in the EPIC chart.  ROS:  Performed with pertinent positives and negatives included in the history, assessment and plan.   Additional significant findings : None   Exam: Wandra Scot assistant Vitals:   09/26/18 1147  BP: 126/80  Weight: 223 lb (101.2 kg)  Height: 5\' 4"  (1.626 m)   Body mass index is 38.28 kg/m.  General appearance:  Normal affect, orientation and appearance. Skin: Grossly normal HEENT: Without gross lesions.  No cervical or supraclavicular adenopathy. Thyroid normal.  Lungs:  Clear without wheezing, rales or rhonchi Cardiac: RR, without RMG Abdominal:  Soft, nontender, without masses, guarding, rebound, organomegaly or hernia Breasts:  Examined lying and sitting without masses, retractions, discharge or axillary adenopathy. Pelvic:  Ext, BUS, Vagina: With atrophic changes.  Scant discharge noted.  Adnexa: Without masses or tenderness    Anus and perineum: Normal   Rectovaginal: Normal sphincter tone without palpated masses or tenderness.    Assessment/Plan:  73 y.o. G62P1001 female for breast and pelvic exam.  Status post LAVH RSO 2004 for endometriosis.  LSO 1984  1. Postmenopausal/atrophic genital changes.  No significant menopausal symptoms. 2. Vaginal irritation intermittently.  Wet prep is positive for yeast.  Urine analysis also shows some yeast.  Will culture and treat if grows out bacteria.  Otherwise we will go ahead and cover with Diflucan 150 mg x 1 dose.  She already has 1 tablet at home and will go ahead and take this.  Will call if she has continued symptoms. 3. Mammography 09/2018.   Continue with annual mammography next year.  Breast exam normal today. 4. Colonoscopy 2018.  Repeat at their recommended interval. 5. Osteopenia.  DEXA 2017 T score -1.7 FRAX 14.9% / 2.9%.  She is followed by her primary physician for this and is going to check with them as far as when she is due for another bone density.  She will continue to follow-up with them in reference to bone health. 6. Pap smear 2012.  No Pap smear done today.  No history of significant abnormal Pap smears.  We both agree to stop screening per current screening guidelines based on age and hysterectomy history. 7. Health maintenance.  No routine lab work done as this is done elsewhere.  Follow-up 1 year, sooner as needed.   Anastasio Auerbach MD, 12:05 PM 09/26/2018

## 2018-09-28 LAB — URINALYSIS, COMPLETE W/RFL CULTURE
Bilirubin Urine: NEGATIVE
Hgb urine dipstick: NEGATIVE
Hyaline Cast: NONE SEEN /LPF
Ketones, ur: NEGATIVE
Leukocyte Esterase: NEGATIVE
Nitrites, Initial: NEGATIVE
Protein, ur: NEGATIVE
RBC / HPF: NONE SEEN /HPF (ref 0–2)
Specific Gravity, Urine: 1.011 (ref 1.001–1.03)
pH: 5 (ref 5.0–8.0)

## 2018-09-28 LAB — URINE CULTURE
MICRO NUMBER:: 922899
Result:: NO GROWTH
SPECIMEN QUALITY:: ADEQUATE

## 2018-09-28 LAB — CULTURE INDICATED

## 2018-10-09 ENCOUNTER — Encounter: Payer: Self-pay | Admitting: Gynecology

## 2018-11-25 DIAGNOSIS — Z23 Encounter for immunization: Secondary | ICD-10-CM | POA: Diagnosis not present

## 2018-11-25 DIAGNOSIS — I872 Venous insufficiency (chronic) (peripheral): Secondary | ICD-10-CM | POA: Diagnosis not present

## 2018-11-25 DIAGNOSIS — E119 Type 2 diabetes mellitus without complications: Secondary | ICD-10-CM | POA: Diagnosis not present

## 2018-11-25 DIAGNOSIS — E039 Hypothyroidism, unspecified: Secondary | ICD-10-CM | POA: Diagnosis not present

## 2018-11-25 DIAGNOSIS — Z1389 Encounter for screening for other disorder: Secondary | ICD-10-CM | POA: Diagnosis not present

## 2018-11-25 DIAGNOSIS — Z0000 Encounter for general adult medical examination without abnormal findings: Secondary | ICD-10-CM | POA: Diagnosis not present

## 2018-11-25 DIAGNOSIS — E559 Vitamin D deficiency, unspecified: Secondary | ICD-10-CM | POA: Diagnosis not present

## 2018-11-25 DIAGNOSIS — E785 Hyperlipidemia, unspecified: Secondary | ICD-10-CM | POA: Diagnosis not present

## 2018-11-25 DIAGNOSIS — K219 Gastro-esophageal reflux disease without esophagitis: Secondary | ICD-10-CM | POA: Diagnosis not present

## 2018-11-25 DIAGNOSIS — Z7984 Long term (current) use of oral hypoglycemic drugs: Secondary | ICD-10-CM | POA: Diagnosis not present

## 2018-12-05 DIAGNOSIS — H524 Presbyopia: Secondary | ICD-10-CM | POA: Diagnosis not present

## 2018-12-05 DIAGNOSIS — H52203 Unspecified astigmatism, bilateral: Secondary | ICD-10-CM | POA: Diagnosis not present

## 2018-12-05 DIAGNOSIS — E119 Type 2 diabetes mellitus without complications: Secondary | ICD-10-CM | POA: Diagnosis not present

## 2018-12-05 DIAGNOSIS — H2513 Age-related nuclear cataract, bilateral: Secondary | ICD-10-CM | POA: Diagnosis not present

## 2018-12-19 DIAGNOSIS — Z20828 Contact with and (suspected) exposure to other viral communicable diseases: Secondary | ICD-10-CM | POA: Diagnosis not present

## 2018-12-19 DIAGNOSIS — B349 Viral infection, unspecified: Secondary | ICD-10-CM | POA: Diagnosis not present

## 2019-01-14 ENCOUNTER — Telehealth: Payer: Self-pay

## 2019-01-14 MED ORDER — FLUCONAZOLE 150 MG PO TABS: ORAL_TABLET | ORAL | 0 refills | Status: DC

## 2019-01-14 NOTE — Telephone Encounter (Signed)
Rx sent and patient informed.  

## 2019-01-14 NOTE — Telephone Encounter (Signed)
Patient had yeast at Aug and Sept visit with Dr. Loetta Rough and was treated with Diflucan.  She said she has symptoms again and would like Rx if you think that would be ok.

## 2019-01-14 NOTE — Telephone Encounter (Signed)
[place] for refill, office visit if no relief

## 2019-02-24 DIAGNOSIS — B079 Viral wart, unspecified: Secondary | ICD-10-CM | POA: Diagnosis not present

## 2019-02-24 DIAGNOSIS — M85859 Other specified disorders of bone density and structure, unspecified thigh: Secondary | ICD-10-CM | POA: Diagnosis not present

## 2019-02-24 DIAGNOSIS — E785 Hyperlipidemia, unspecified: Secondary | ICD-10-CM | POA: Diagnosis not present

## 2019-02-24 DIAGNOSIS — E1165 Type 2 diabetes mellitus with hyperglycemia: Secondary | ICD-10-CM | POA: Diagnosis not present

## 2019-02-24 DIAGNOSIS — D485 Neoplasm of uncertain behavior of skin: Secondary | ICD-10-CM | POA: Diagnosis not present

## 2019-03-10 DIAGNOSIS — M85851 Other specified disorders of bone density and structure, right thigh: Secondary | ICD-10-CM | POA: Diagnosis not present

## 2019-03-10 DIAGNOSIS — Z780 Asymptomatic menopausal state: Secondary | ICD-10-CM | POA: Diagnosis not present

## 2019-03-13 ENCOUNTER — Other Ambulatory Visit: Payer: Self-pay

## 2019-03-14 ENCOUNTER — Encounter: Payer: Self-pay | Admitting: Obstetrics and Gynecology

## 2019-03-14 ENCOUNTER — Ambulatory Visit (INDEPENDENT_AMBULATORY_CARE_PROVIDER_SITE_OTHER): Payer: Medicare Other | Admitting: Obstetrics and Gynecology

## 2019-03-14 ENCOUNTER — Other Ambulatory Visit: Payer: Self-pay

## 2019-03-14 VITALS — BP 122/76

## 2019-03-14 DIAGNOSIS — R102 Pelvic and perineal pain: Secondary | ICD-10-CM | POA: Diagnosis not present

## 2019-03-14 DIAGNOSIS — N898 Other specified noninflammatory disorders of vagina: Secondary | ICD-10-CM

## 2019-03-14 DIAGNOSIS — B373 Candidiasis of vulva and vagina: Secondary | ICD-10-CM | POA: Diagnosis not present

## 2019-03-14 DIAGNOSIS — B3731 Acute candidiasis of vulva and vagina: Secondary | ICD-10-CM

## 2019-03-14 LAB — WET PREP FOR TRICH, YEAST, CLUE

## 2019-03-14 MED ORDER — FLUCONAZOLE 150 MG PO TABS: 150.0000 mg | ORAL_TABLET | ORAL | 0 refills | Status: AC

## 2019-03-14 NOTE — Progress Notes (Signed)
   Jill Dean  1945/07/01 [id number]  HPI The patient is a 74 y.o. G1P1001 with type II diabetes mellitus who presents today for vaginal itching and pelvic pressure. She did notice this as she increased the Jardiance dose.    Physical Exam  BP 122/76   General: Pleasant female, no acute distress, alert and oriented PELVIC EXAM: VULVA: normal appearing vulva with no masses, tenderness or lesions, VAGINA: normal appearing vagina with normal color and discharge, no lesions, CERVIX: surgically absent, UTERUS: surgically absent, vaginal cuff normal.  WET MOUNT done - results: +hyphae, negative clue cells or trichomonas UA negative Caryn Bee present for exam  Assessment 74 yo G1P1 with vaginal candidiasis.   Plan Fluconazole 150 mg PO, repeat in 72 hours (2 doses).  Let us know if symptoms do not improve.   Joseph Pierini MD 03/14/19

## 2019-03-16 LAB — URINALYSIS, COMPLETE W/RFL CULTURE
Bacteria, UA: NONE SEEN /HPF
Bilirubin Urine: NEGATIVE
Hgb urine dipstick: NEGATIVE
Hyaline Cast: NONE SEEN /LPF
Ketones, ur: NEGATIVE
Leukocyte Esterase: NEGATIVE
Nitrites, Initial: NEGATIVE
Protein, ur: NEGATIVE
RBC / HPF: NONE SEEN /HPF (ref 0–2)
Specific Gravity, Urine: 1.013 (ref 1.001–1.03)
pH: 5 (ref 5.0–8.0)

## 2019-03-16 LAB — CULTURE INDICATED

## 2019-03-16 LAB — URINE CULTURE
MICRO NUMBER:: 10245721
Result:: NO GROWTH
SPECIMEN QUALITY:: ADEQUATE

## 2019-03-24 ENCOUNTER — Telehealth: Payer: Self-pay | Admitting: Internal Medicine

## 2019-03-24 MED ORDER — DOXYCYCLINE HYCLATE 100 MG PO TABS: 100.0000 mg | ORAL_TABLET | Freq: Two times a day (BID) | ORAL | 0 refills | Status: DC

## 2019-03-24 NOTE — Telephone Encounter (Signed)
Called and spoke to pt. Pt states she knows she has a sinus infection. Pt c/o blowing out yellow mucus mixed with blood, sinus pressure/headache x 1 week. Pt states she is having frontal and maxillary sinus pressure x 1 week. Pt taking OTC zyrtec. Pt denies sob, cough, chest congestion, f/c/s. Dr. Annamaria Boots does not have any current openings. Pt is scheduled for her 1 year ROV in 06/2019.   Dr. Annamaria Boots please advise. Thanks.   Allergies  Allergen Reactions  . Hydrocod Polst-Cpm Polst Er Other (See Comments)    hyperactivity  . Pseudoephedrine Itching, Palpitations and Other (See Comments)    REACTION: tachycardia   . Sulfonamide Derivatives Anaphylaxis and Swelling  . Azithromycin Itching  . Buprenorphine Hcl Itching  . Codeine Itching  . Dilaudid [Hydromorphone Hcl] Itching  . Macrodantin [Nitrofurantoin] Itching    Hands and feet itching. "Trouble breathing" w/lump in throat and stuffy nose.  Marland Kitchen Morphine And Related Itching  . Shrimp [Shellfish Allergy] Itching  . Simvastatin Other (See Comments)    Fatigue, depression and dizziness at 20 mg  . Atorvastatin     Muscle cramps  And her blood sugar kept going up  . Cephalosporins Swelling    Said to cause facial swelling  . Erythromycin Base Itching and Swelling  . Hydrocodone-Acetaminophen Other (See Comments)    Redness of face, swelling of tolerates tylenol  . Penicillinase Itching  . Sulfamethoxazole Itching  . Clarithromycin Itching    REACTION: severe itching    Current Outpatient Medications on File Prior to Visit  Medication Sig Dispense Refill  . ACCU-CHEK AVIVA PLUS test strip 1 each by Other route as needed (check blood sugar).   2  . amitriptyline (ELAVIL) 25 MG tablet Take 25 mg by mouth at bedtime.      Marland Kitchen azelastine (ASTELIN) 0.1 % nasal spray Place into both nostrils as needed for rhinitis. Use in each nostril as directed    . calcium-vitamin D (OSCAL WITH D) 500-200 MG-UNIT tablet Take 1 tablet by mouth daily with  breakfast.     . cetirizine (ZYRTEC) 10 MG tablet Take 10 mg by mouth daily.      Marland Kitchen doxycycline (VIBRA-TABS) 100 MG tablet Take 1 tablet (100 mg total) by mouth 2 (two) times daily. (Patient not taking: Reported on 03/14/2019) 14 tablet 0  . fluconazole (DIFLUCAN) 150 MG tablet Take one tab po and one tab in 3 days prn. (Patient not taking: Reported on 03/14/2019) 2 tablet 0  . fluticasone (FLONASE) 50 MCG/ACT nasal spray Place into both nostrils daily.    Marland Kitchen JARDIANCE 10 MG TABS tablet Take 10 mg by mouth daily.  2  . levothyroxine (SYNTHROID, LEVOTHROID) 100 MCG tablet Take 100 mcg by mouth daily.      . Lutein 20 MG CAPS Take 1 capsule by mouth daily.      . metFORMIN (GLUCOPHAGE) 500 MG tablet Take 500 mg by mouth daily.     . metoprolol succinate (TOPROL-XL) 50 MG 24 hr tablet TAKE 1 TABLET BY MOUTH EVERY DAY WITH OR IMMEDIATELY FOLLOWING A MEAL 90 tablet 3  . omeprazole (PRILOSEC) 20 MG capsule Take 1 capsule (20 mg total) by mouth 2 (two) times daily before a meal. 60 capsule 0   No current facility-administered medications on file prior to visit.

## 2019-03-24 NOTE — Telephone Encounter (Signed)
I think she can take doxycycline  Offer doxycycline 100 mg, # 14, 1 twice daily  Consider using a saline sinus rinse like [facility]

## 2019-03-24 NOTE — Telephone Encounter (Signed)
Called and spoke with Patient.  Dr. Janee Morn recommendations given.  Patient stated she is using a saline rinse as needed.  Patient requested Doxycyline prescription to go to [facility]. Nothing further at this time.

## 2019-04-16 ENCOUNTER — Other Ambulatory Visit: Payer: Self-pay

## 2019-04-18 ENCOUNTER — Encounter: Payer: Self-pay | Admitting: Internal Medicine

## 2019-04-18 ENCOUNTER — Other Ambulatory Visit: Payer: Self-pay

## 2019-04-18 ENCOUNTER — Ambulatory Visit (INDEPENDENT_AMBULATORY_CARE_PROVIDER_SITE_OTHER): Payer: Medicare Other | Admitting: Internal Medicine

## 2019-04-18 VITALS — BP 122/74 | HR 79 | Temp 98.0°F | Ht 62.0 in | Wt 222.8 lb

## 2019-04-18 DIAGNOSIS — E119 Type 2 diabetes mellitus without complications: Secondary | ICD-10-CM | POA: Diagnosis not present

## 2019-04-18 DIAGNOSIS — E781 Pure hyperglyceridemia: Secondary | ICD-10-CM | POA: Diagnosis not present

## 2019-04-18 LAB — POCT GLYCOSYLATED HEMOGLOBIN (HGB A1C): Hemoglobin A1C: 7 % — AB (ref 4.0–5.6)

## 2019-04-18 MED ORDER — GLIPIZIDE 5 MG PO TABS: 5.0000 mg | ORAL_TABLET | Freq: Every day | ORAL | 1 refills | Status: DC

## 2019-04-18 NOTE — Progress Notes (Signed)
Name: Jill Dean  MRN/ DOB: [id number], October 07, 1945   Age/ Sex: 74 y.o., female    PCP: Leeroy Cha, MD   Reason for Endocrinology Evaluation: Type 2 Diabetes Mellitus     Date of Initial Endocrinology Visit: 04/18/2019     PATIENT IDENTIFIER: Jill Dean is a 74 y.o. female with a past medical history of T2DM, Hypothyroidism and Dyslipidemia. The patient presented for initial endocrinology clinic visit on 04/18/2019 for consultative assistance with her diabetes management.    HPI: Ms. Surgeon was    Diagnosed with T2DM in 2013 Prior Medications tried/Intolerance: Metformin - GI issues, Jardiance - recurrent yeast infection  Currently checking blood sugars 1 x / day Hypoglycemia episodes : no         Hemoglobin A1c has ranged from 7.0% in 2015, peaking at 7.8% in 2020. Patient required assistance for hypoglycemia: no  Patient has required hospitalization within the last 1 year from hyper or hypoglycemia: no   In terms of diet, the patient eats 3 meals a day, snacks in the afternoon . Avoids- sugar sweetened beverages    Has recurrent yeast infection   HOME DIABETES REGIMEN: Glimepiride 2 mg daily  Jardiance 25 mg daily  Metformin 500 mg ER BID    Statin:  Intolerant  ACE-I/ARB: no Prior Diabetic Education: Yes   METER DOWNLOAD SUMMARY: Date range evaluated: 4/3-4/16/2021 Fingerstick Blood Glucose Tests = 15 Average Number Tests/Day = 1.1 Overall Mean FS Glucose = 138   BG Ranges: Low = 103 High = 174   Hypoglycemic Events/30 Days: BG < 50 = 0 Episodes of symptomatic severe hypoglycemia = 0   DIABETIC COMPLICATIONS: Microvascular complications:    Denies: CKD, retinopathy   Last eye exam: Completed 11/2018  Macrovascular complications:    Denies: CAD, PVD, CVA   PAST HISTORY: Past Medical History:  Past Medical History:  Diagnosis Date  . Chest pain    a. s/p normal ett nuclear stress test on 10/16/13  . Complication of  anesthesia   . Diabetes mellitus without complication ([place])   . Dysrhythmia    fast heart rate  . GERD (gastroesophageal reflux disease)   . HLD (hyperlipidemia)   . Hypothyroidism   . Obesity   . Osteoarthrosis, unspecified whether generalized or localized, unspecified site   . Other chronic allergic conjunctivitis   . PONV (postoperative nausea and vomiting)   . Raynaud's syndrome   . Urticaria, unspecified    Past Surgical History:  Past Surgical History:  Procedure Laterality Date  . CHOLECYSTECTOMY    . CYST EXCISION Left 11/10/2014   Procedure: EXCISION CYST LEFT INDEX FINGER AND CYST LEFT SMALL FINGER ;  Surgeon: [name], MD;  Location: [facility];  Service: Orthopedics;  Laterality: Left;  . ESOPHAGOGASTRODUODENOSCOPY N/A 01/05/2014   Procedure: ESOPHAGOGASTRODUODENOSCOPY (EGD);  Surgeon: [name], MD;  Location: [facility];  Service: Endoscopy;  Laterality: N/A;  . knot right breast    . LEFT HEART CATHETERIZATION WITH CORONARY ANGIOGRAM N/A 10/22/2013   Procedure: LEFT HEART CATHETERIZATION WITH CORONARY ANGIOGRAM;  Surgeon: [name], MD;  Location: [facility];  Service: Cardiovascular;  Laterality: N/A;  . OOPHORECTOMY     LSO 84-RSO 04  . thyroid nodule    . TONSILLECTOMY    . VAGINAL HYSTERECTOMY  2004   LAVH RSO endometriosis  . VESICOVAGINAL FISTULA CLOSURE W/ TAH        Social History:  reports that she has never  smoked. She has never used smokeless tobacco. She reports current alcohol use. She reports that she does not use drugs. Family History:  Family History  Problem Relation Age of Onset  . Lung cancer Father   . Heart disease Father   . Heart failure Mother   . Dementia Mother   . Heart disease Maternal Uncle   . Lung cancer Paternal Uncle   . COPD Paternal Uncle      HOME MEDICATIONS: Allergies as of 04/18/2019      Reactions   Hydrocod Polst-cpm Polst Er Other (See Comments)   hyperactivity    Pseudoephedrine Itching, Palpitations, Other (See Comments)   REACTION: tachycardia   Sulfonamide Derivatives Anaphylaxis, Swelling   Azithromycin Itching   Buprenorphine Hcl Itching   Codeine Itching   Dilaudid [hydromorphone Hcl] Itching   Macrodantin [nitrofurantoin] Itching   Hands and feet itching. "Trouble breathing" w/lump in throat and stuffy nose.   Morphine And Related Itching   Shrimp [shellfish Allergy] Itching   Simvastatin Other (See Comments)   Fatigue, depression and dizziness at 20 mg   Atorvastatin    Muscle cramps  And her blood sugar kept going up   Cephalosporins Swelling   Said to cause facial swelling   Erythromycin Base Itching, Swelling   Hydrocodone-acetaminophen Other (See Comments)   Redness of face, swelling of tolerates tylenol   Penicillinase Itching   Sulfamethoxazole Itching   Clarithromycin Itching   REACTION: severe itching      Medication List       Accurate as of April 18, 2019  2:09 PM. If you have any questions, ask your nurse or doctor.        STOP taking these medications   doxycycline 100 MG tablet Commonly known as: VIBRA-TABS Stopped by: Dorita Sciara, MD   fluconazole 150 MG tablet Commonly known as: DIFLUCAN Stopped by: Dorita Sciara, MD   metFORMIN 500 MG tablet Commonly known as: GLUCOPHAGE Stopped by: Dorita Sciara, MD     TAKE these medications   Accu-Chek Aviva Plus test strip Generic drug: glucose blood 1 each by Other route as needed (check blood sugar).   amitriptyline 25 MG tablet Commonly known as: ELAVIL Take 25 mg by mouth at bedtime.   azelastine 0.1 % nasal spray Commonly known as: ASTELIN Place into both nostrils as needed for rhinitis. Use in each nostril as directed   calcium-vitamin D 500-200 MG-UNIT tablet Commonly known as: OSCAL WITH D Take 1 tablet by mouth daily with breakfast.   cetirizine 10 MG tablet Commonly known as: ZYRTEC Take 10 mg by mouth daily.     fluticasone 50 MCG/ACT nasal spray Commonly known as: FLONASE Place into both nostrils daily.   HEMP OIL-VANILLYL BUTYL ETHER EX Apply topically.   Jardiance 10 MG Tabs tablet Generic drug: empagliflozin Take 10 mg by mouth daily.   levothyroxine 100 MCG tablet Commonly known as: SYNTHROID Take 100 mcg by mouth daily.   Lutein 20 MG Caps Take 1 capsule by mouth daily.   metoprolol succinate 50 MG 24 hr tablet Commonly known as: TOPROL-XL TAKE 1 TABLET BY MOUTH EVERY DAY WITH OR IMMEDIATELY FOLLOWING A MEAL   omeprazole 20 MG capsule Commonly known as: PRILOSEC Take 1 capsule (20 mg total) by mouth 2 (two) times daily before a meal.        ALLERGIES: Allergies  Allergen Reactions  . Hydrocod Polst-Cpm Polst Er Other (See Comments)    hyperactivity  . Pseudoephedrine Itching,  Palpitations and Other (See Comments)    REACTION: tachycardia   . Sulfonamide Derivatives Anaphylaxis and Swelling  . Azithromycin Itching  . Buprenorphine Hcl Itching  . Codeine Itching  . Dilaudid [Hydromorphone Hcl] Itching  . Macrodantin [Nitrofurantoin] Itching    Hands and feet itching. "Trouble breathing" w/lump in throat and stuffy nose.  Marland Kitchen Morphine And Related Itching  . Shrimp [Shellfish Allergy] Itching  . Simvastatin Other (See Comments)    Fatigue, depression and dizziness at 20 mg  . Atorvastatin     Muscle cramps  And her blood sugar kept going up  . Cephalosporins Swelling    Said to cause facial swelling  . Erythromycin Base Itching and Swelling  . Hydrocodone-Acetaminophen Other (See Comments)    Redness of face, swelling of tolerates tylenol  . Penicillinase Itching  . Sulfamethoxazole Itching  . Clarithromycin Itching    REACTION: severe itching     REVIEW OF SYSTEMS: A comprehensive ROS was conducted with the patient and is negative except as per HPI and below:  Review of Systems  Gastrointestinal: Negative for diarrhea and nausea.  Genitourinary: Negative  for frequency.  Neurological: Negative for tingling.  Endo/Heme/Allergies: Negative for polydipsia.      OBJECTIVE:   VITAL SIGNS: BP 122/74 (BP Location: Left Arm, Patient Position: Sitting, Cuff Size: Normal)   Pulse 79   Temp 98 F (36.7 C)   Ht 5\' 2"  (1.575 m)   Wt 222 lb 12.8 oz (101.1 kg)   SpO2 98%   BMI 40.75 kg/m    PHYSICAL EXAM:  General: Pt appears well and is in NAD  HEENT:  Eyes: External eye exam normal without stare, lid lag or exophthalmos.  EOM intact.  PERRL.  Neck: General: Supple without adenopathy or carotid bruits. Thyroid: Thyroid size normal.  No goiter or nodules appreciated. No thyroid bruit.  Lungs: Clear with good BS bilat with no rales, rhonchi, or wheezes  Heart: RRR with normal S1 and S2 and no gallops; no murmurs; no rub  Abdomen: Normoactive bowel sounds, soft, nontender, without masses or organomegaly palpable  Extremities:  Lower extremities - No pretibial edema. No lesions.  Skin: Normal texture and temperature to palpation. No rash noted. No Acanthosis nigricans/skin tags. No lipohypertrophy.  Neuro: MS is good with appropriate affect, pt is alert and Ox3    DM foot exam: 04/18/2019  The skin of the feet is intact without sores or ulcerations. The pedal pulses are 2+ on right and 2+ on left. The sensation is intact to a screening 5.07, 10 gram monofilament bilaterally   DATA REVIEWED: 11/25/2018 BuN/ Cr 18/0.71  GFR 81 Tg 367 HDL 55 LDL 96  A1c 7.8%  MA/Cr ratio < 41.2  TSH 2.17   ASSESSMENT / PLAN / RECOMMENDATIONS:   1) Type 2 Diabetes Mellitus, Optimally controlled, Without complications - Most recent A1c of 7.0 %. Goal A1c < 7.0 %.    Plan: GENERAL: I have discussed with the patient the pathophysiology of diabetes. We went over the natural progression of the disease. We talked about both insulin resistance and insulin deficiency. We stressed the importance of lifestyle changes including diet and exercise. I explained  the complications associated with diabetes including retinopathy, nephropathy, neuropathy as well as increased risk of cardiovascular disease. We went over the benefit seen with glycemic control.    I explained to the patient that diabetic patients are at higher than normal risk for amputations.   In review of her glucose meter  download today her BG's have been optimal.  Patient is intolerant to Jardiance due to recurrent vaginal yeast infections, we will have to stop this.  She is intolerant to Metformin as well  I am going to stop glimepiride, and switch her to glipizide that way we can do twice daily dosing if needed in the future.  MEDICATIONS: - STOP Jardiance  - STOP Glimepiride  - Start Glipizide 5 mg before Breakfast     EDUCATION / INSTRUCTIONS:  BG monitoring instructions: Patient is instructed to check her blood sugars 1 times a day.  Call [facility] if: BG persistently < 70 or > 300. . I reviewed the Rule of 15 for the treatment of hypoglycemia in detail with the patient. Literature supplied.   2) Diabetic complications:   Eye: Does not have known diabetic retinopathy.   Neuro/ Feet: Does not have known diabetic peripheral neuropathy.  Renal: Patient does not have known baseline CKD. She is not on an ACEI/ARB at present.Up to date on urine albumin/creatinine ratio .  3) Hypertriglyceridemia: Patient is not on a statin.  Patient is intolerant to statins.  We discussed the cardiovascular benefits.  We discussed her elevated triglyceride levels, I have encouraged her to follow a low-fat diet as well as a low-carb diet.   Follow-up in 3 months      Signed electronically by: Mack Guise, MD  [facility]  [facility] [address] [place], [place] [address] Phone: [phone] FAX: [phone]   CC: Leeroy Cha, MD [address] [place] [address] Phone:  [phone]  Fax: [phone]    Return to Endocrinology clinic as below: Future Appointments  Date Time Provider [place]  06/16/2019  1:30 PM Deneise Lever, MD [facility] None

## 2019-04-18 NOTE — Patient Instructions (Signed)
-   STOP Jardiance  - STOP Glimepiride  - Start Glipizide 5 mg before Breakfast       HOW TO TREAT LOW BLOOD SUGARS (Blood sugar LESS THAN 70 MG/DL)  Please follow the RULE OF 15 for the treatment of hypoglycemia treatment (when your (blood sugars are less than 70 mg/dL)    STEP 1: Take 15 grams of carbohydrates when your blood sugar is low, which includes:   3-4 GLUCOSE TABS  OR  3-4 OZ OF JUICE OR REGULAR SODA OR  ONE TUBE OF GLUCOSE GEL     STEP 2: RECHECK blood sugar in 15 MINUTES STEP 3: If your blood sugar is still low at the 15 minute recheck --> then, go back to STEP 1 and treat AGAIN with another 15 grams of carbohydrates.

## 2019-04-21 ENCOUNTER — Encounter: Payer: Self-pay | Admitting: Internal Medicine

## 2019-04-21 ENCOUNTER — Ambulatory Visit (INDEPENDENT_AMBULATORY_CARE_PROVIDER_SITE_OTHER): Payer: Medicare Other | Admitting: Internal Medicine

## 2019-04-21 ENCOUNTER — Other Ambulatory Visit: Payer: Self-pay

## 2019-04-21 DIAGNOSIS — J3089 Other allergic rhinitis: Secondary | ICD-10-CM

## 2019-04-21 DIAGNOSIS — J0181 Other acute recurrent sinusitis: Secondary | ICD-10-CM | POA: Diagnosis not present

## 2019-04-21 DIAGNOSIS — J302 Other seasonal allergic rhinitis: Secondary | ICD-10-CM | POA: Diagnosis not present

## 2019-04-21 MED ORDER — DOXYCYCLINE HYCLATE 100 MG PO TABS: 100.0000 mg | ORAL_TABLET | Freq: Two times a day (BID) | ORAL | 0 refills | Status: DC

## 2019-04-21 NOTE — Progress Notes (Signed)
Patient ID: Jill Dean, female    DOB: Dec 08, 1945, 74 y.o.   MRN: [id number]  HPI female never smoker followed for allergic rhinitis/conjunctivitis, complicated by history Raynaud's syndrome, GERD, urticaria, DM, PSVT  ---------------------------------------------------------------------------------------------------  06/24/2018- Virtual Visit via Telephone Note  History of Present Illness: 74 year old female never smoker followed for allergic rhinitis/conjunctivitis, complicated by history Raynaud's syndrome, GERD, urticaria, DM2, PSVT Zyrtec, Flonase, Astelin -----breathing at baseline, pt reports allergy flare-ups, but no more than usual Headed for the beach, feeling well controlled. Present meds are sufficient.  No hx asthma. Denies other medical concerns at this time.   Observations/Objective: Assessment and Plan: Allergic rhinitis- stable- plan Doxycycline to hold. Continue nasal sprays and antihistamines. Allergic conjunctivitis- controlled with same meds as rhinitis. Occ eye drops.  Follow Up Instructions: 1 year   Baird Lyons, MD  04/21/19-  Virtual Visit via Telephone Note  I connected with Jill Dean on 04/21/19 at  2:30 PM EDT by telephone and verified that I am speaking with the correct person using two identifiers.  Location: Patient: H Provider: O   I discussed the limitations, risks, security and privacy concerns of performing an evaluation and management service by telephone and the availability of in person appointments. I also discussed with the patient that there may be a patient responsible charge related to this service. The patient expressed understanding and agreed to proceed.   History of Present Illness: 74 year old female never smoker followed for allergic rhinitis/conjunctivitis, complicated by history Raynaud's syndrome, GERD, urticaria, DM2, Hypothyroid, Dyslipidemia, PSVT Zyrtec, Flonase, Astelin Resolved a sinus infection with doxycyline  last month. Was well until exposed to 14 YO grandchild a few days ago, who got sick at daycare. She has now had 3 days of cough, postnasal drainage, fever. Taking Zyrtec. No Covax Planning trip to beach soon.  I encouraged Covax.   Observations/Objective:   Assessment and Plan: URI with rhinosinusitis and bronchitis- plan fluids, doxycycline  Follow Up Instructions: As scheduled June 14   I discussed the assessment and treatment plan with the patient. The patient was provided an opportunity to ask questions and all were answered. The patient agreed with the plan and demonstrated an understanding of the instructions.   The patient was advised to call back or seek an in-person evaluation if the symptoms worsen or if the condition fails to improve as anticipated.  I provided 17 minutes of non-face-to-face time during this encounter.   Baird Lyons, MD     Review of Systems-See HPI + = positive Constitutional:   No-   weight loss, night sweats, fevers, chills, fatigue, lassitude. HEENT:    headaches, difficulty swallowing, tooth/dental problems, +sore throat,       sneezing, itching, ear ache, +nasal congestion, +post nasal drip,  CV:  No-   chest pain, orthopnea, PND, swelling in lower extremities, anasarca,  dizziness, palpitations Resp: No-   shortness of breath with exertion or at rest.            +-productive cough,  No non-productive cough,  No-  coughing up of blood.              -change in color of mucus.  No- wheezing.    Objective:   Physical Exam General- Alert, Oriented, Affect-appropriate, Distress- none acute  Overweight Skin- rash-none, lesions- none, excoriation- none Lymphadenopathy- none Head- atraumatic            Eyes- Gross vision intact, PERRLA, conjunctivae clear secretions, +periorbital edema  Ears- Hearing,             Nose- +mucus, , No-Septal dev,  polyps, erosion, perforation,             Throat- Mallampati III-IV , mucosa clear-not red ,  drainage- none, tonsils- atrophic.  Neck- flexible , trachea midline, no stridor , thyroid nl, carotid no bruit Chest -            Lung- clear to P&A but diminished, wheeze- none, cough- none , dullness-none, rub- none           Chest wall-  Abd-  Br/ Gen/ Rectal- Not done, not indicated Extrem- cyanosis- none, clubbing, none, atrophy- none, strength- nl Neuro- grossly intact to observation

## 2019-04-21 NOTE — Assessment & Plan Note (Signed)
Acute URI- viral with tendency to develop sinusitis/ bronchitis Plan- sips, hydration, cough syrup. Doxycycline.

## 2019-04-21 NOTE — Assessment & Plan Note (Signed)
Heavy spring pollen season may contribute to her tendency to sinus infections Plan- consider mask outdoors. Ok to try different antihistamines as needed.

## 2019-04-21 NOTE — Patient Instructions (Addendum)
Script for [facility]  Fluids, sips of liquids, throat lozenges and OTC cough syrup like Delsym may help  Please call as needed  Ok to keep June 14 appointment

## 2019-05-05 ENCOUNTER — Ambulatory Visit: Payer: Medicare Other | Admitting: Pulmonary Disease

## 2019-05-05 ENCOUNTER — Telehealth: Payer: Self-pay | Admitting: Internal Medicine

## 2019-05-05 DIAGNOSIS — Z20822 Contact with and (suspected) exposure to covid-19: Secondary | ICD-10-CM | POA: Diagnosis not present

## 2019-05-05 DIAGNOSIS — J209 Acute bronchitis, unspecified: Secondary | ICD-10-CM | POA: Diagnosis not present

## 2019-05-05 NOTE — Telephone Encounter (Signed)
I called pt and husband said she wasn't there and that she was getting a covid test. I advised him to have her call us back to schedule appt with Dr. Annamaria Boots for a televisit. Will await a return call.

## 2019-05-05 NOTE — Telephone Encounter (Signed)
Called spoke with patient, she has NOT been vaccinated - stated that she "cannot get well enough to be vaccinated."  Patient refuses to get covid tested and would like message routed to Dr Annamaria Boots as recommended by Aaron Edelman NP below.  Dr Annamaria Boots please advise if you would like to manage this virtually.  Thank you.  Appt with Aaron Edelman NP has been cancelled.

## 2019-05-05 NOTE — Telephone Encounter (Signed)
Called spoke with patient who reports that she is still having cough.  Patient had telelvisit with Dr Annamaria Boots 4/19 and given Docycycline x7 days.  Patient stated that she is still having cough, occasionally "milky yellow" mucus.  Patient does check her O2 periodically at home and notes that it has been as low as 87% (patient has documented history of Raynaud's Syndrome).  Sats currently are 92%.  No increased shortness of breath, no hemoptysis, no purulent sputum, no head congestion or PND, no wheezing or tightness in chest.  No f/c/s.  Patient has had no known sick contacts.  Patient stated that she had Covid in December (no record of this in Epic).  Patient declined virtual visit - would like to come in for appt.  Appt scheduled with Aaron Edelman NP but sending message to Aaron Edelman NP to ensure this is okay for patient to come in for appt.  Covid screening neg on the phone

## 2019-05-05 NOTE — Telephone Encounter (Signed)
Ok to set up televisit in the next week as available

## 2019-05-05 NOTE — Telephone Encounter (Signed)
Called and spoke with pt who stated that she ended up going to [facility] to be evaluated. She stated that they performed a covid test which came back negative. She stated when they listened to her lungs, her lungs sounded clear but when they had her cough she stated they said that she had bronchitis. They are treating her with moxifloxacin x7 days and also gave her meds for yeast infection due to possibility of having one due to the abx she was prescribed.  Pt stated she did not need to schedule a televisit at this time with [facility] but stated if she was no better after meds she was placed on, she would call back.  Nothing further needed.

## 2019-05-05 NOTE — Telephone Encounter (Signed)
Pt called back, has bronchitis and does not need to come in any more.

## 2019-05-05 NOTE — Telephone Encounter (Signed)
Is the patient vaccinated?  If not she will need to have negative Covid testing.  We need documented proof before we will see an acute patient in office.  Given the ongoing pandemic.If patient disagrees with this this message can be routed to Dr. Annamaria Boots and he can see if he wants to manage this virtually.  Wyn Quaker FNP

## 2019-05-08 ENCOUNTER — Telehealth: Payer: Self-pay | Admitting: Internal Medicine

## 2019-05-08 MED ORDER — DOXYCYCLINE HYCLATE 100 MG PO TABS: ORAL_TABLET | ORAL | 0 refills | Status: DC

## 2019-05-08 NOTE — Telephone Encounter (Signed)
Offer doxycycline 100 mg, # 8, 2 today then one daily 

## 2019-05-08 NOTE — Telephone Encounter (Signed)
Please advise 

## 2019-05-08 NOTE — Telephone Encounter (Signed)
Called and spoke with Patient.  Patient stated she was prescribed Moxifloxacin from Urgent Care for bronchitis.  Patient stated she has taken 3 days worth, but is now starting to break out in a rash.  Patient stated she is still having a dry, nagging cough, but her other symptoms have gotten better. Patient is requesting a prescription of doxycycline for her bronchitis, so it does not start back up.  Allergies  Allergen Reactions  . Hydrocod Polst-Cpm Polst Er Other (See Comments)    hyperactivity  . Pseudoephedrine Itching, Palpitations and Other (See Comments)    REACTION: tachycardia   . Sulfonamide Derivatives Anaphylaxis and Swelling  . Azithromycin Itching  . Buprenorphine Hcl Itching  . Codeine Itching  . Dilaudid [Hydromorphone Hcl] Itching  . Macrodantin [Nitrofurantoin] Itching    Hands and feet itching. "Trouble breathing" w/lump in throat and stuffy nose.  Marland Kitchen Morphine And Related Itching  . Shrimp [Shellfish Allergy] Itching  . Simvastatin Other (See Comments)    Fatigue, depression and dizziness at 20 mg  . Atorvastatin     Muscle cramps  And her blood sugar kept going up  . Cephalosporins Swelling    Said to cause facial swelling  . Erythromycin Base Itching and Swelling  . Hydrocodone-Acetaminophen Other (See Comments)    Redness of face, swelling of tolerates tylenol  . Penicillinase Itching  . Sulfamethoxazole Itching  . Clarithromycin Itching    REACTION: severe itching   Current Outpatient Medications on File Prior to Visit  Medication Sig Dispense Refill  . ACCU-CHEK AVIVA PLUS test strip 1 each by Other route as needed (check blood sugar).   2  . amitriptyline (ELAVIL) 25 MG tablet Take 25 mg by mouth at bedtime.      Marland Kitchen azelastine (ASTELIN) 0.1 % nasal spray Place into both nostrils as needed for rhinitis. Use in each nostril as directed    . calcium-vitamin D (OSCAL WITH D) 500-200 MG-UNIT tablet Take 1 tablet by mouth daily with breakfast.     . cetirizine  (ZYRTEC) 10 MG tablet Take 10 mg by mouth daily.      Marland Kitchen doxycycline (VIBRA-TABS) 100 MG tablet Take 1 tablet (100 mg total) by mouth 2 (two) times daily. 14 tablet 0  . fluticasone (FLONASE) 50 MCG/ACT nasal spray Place into both nostrils daily.    Marland Kitchen glipiZIDE (GLUCOTROL) 5 MG tablet Take 1 tablet (5 mg total) by mouth daily before breakfast. 90 tablet 1  . levothyroxine (SYNTHROID, LEVOTHROID) 100 MCG tablet Take 100 mcg by mouth daily.      . Lutein 20 MG CAPS Take 1 capsule by mouth daily.      . metoprolol succinate (TOPROL-XL) 50 MG 24 hr tablet TAKE 1 TABLET BY MOUTH EVERY DAY WITH OR IMMEDIATELY FOLLOWING A MEAL 90 tablet 3  . omeprazole (PRILOSEC) 20 MG capsule Take 1 capsule (20 mg total) by mouth 2 (two) times daily before a meal. 60 capsule 0  . SHINGRIX injection      No current facility-administered medications on file prior to visit.   Message routed to Dr Annamaria Boots

## 2019-05-08 NOTE — Telephone Encounter (Signed)
Pt scheduled for 05/12/2019 @ 9:10 and was instructed to d/c glipizide until then per provider

## 2019-05-08 NOTE — Telephone Encounter (Signed)
Called and spoke with Patient.  Dr Janee Morn recommendations given. Prescription sent to [facility], per Patient request.  Nothing further at this time.

## 2019-05-08 NOTE — Telephone Encounter (Signed)
Patient called stating she thinks she's having an allergic reaction to the glipizide - she has a rash on her arms and says its very itchy. She did say she did not want to go back on Jardiance due to causing yeast infections. Please advise. [phone]

## 2019-05-09 ENCOUNTER — Telehealth: Payer: Self-pay | Admitting: Internal Medicine

## 2019-05-09 NOTE — Telephone Encounter (Signed)
Spoke to pt and she stated that she still has glimepiride 5 mg tabs left and will start them tomorrow since directions are to take before breakfast. Pt stated that he blood sugars today have been 161 this morning and 125 as of our phone call. If pt shows up for appt Monday then meds did not work over the weekend but if she does not then they did.

## 2019-05-09 NOTE — Progress Notes (Deleted)
Name: Jill Dean  Age/ Sex: 74 y.o., female   MRN/ DOB: [id number], [birth year]     PCP: Leeroy Cha, MD   Reason for Endocrinology Evaluation: Type 2 Diabetes Mellitus  Initial Endocrine Consultative Visit: 04/18/2019    PATIENT IDENTIFIER: [name] is a 74 y.o. female with a past medical history of T2DM, Hypothyroidism and Dyslipidemia. The patient has followed with Endocrinology clinic since 04/18/2019 for consultative assistance with management of her diabetes.  DIABETIC HISTORY:  Jill Dean was diagnosed with DM in 2013. She has GI intolerance to Metformin she has reported recurrent yeast infections to Jardiance.  Her hemoglobin A1c has ranged from 7.0% in 2015, peaking at 7.8% in 2020.   On her initial visit to our clinic she had an A1c of 7.0%  , she was on Glimepiride, but was not taking  jardiance nor Metformin due to intolerance.   SUBJECTIVE:   During the last visit (04/18/2019): A1c 7.0% She was on Glimepiride, which we switched to Glipizide, she is not tolerating   Today (05/09/2019): Jill Dean  She checks her blood sugars *** times daily, preprandial to breakfast and ***. The patient has *** had hypoglycemic episodes since the last clinic visit, which typically occur *** x / - most often occuring ***. The patient is *** symptomatic with these episodes, with symptoms of {symptoms; hypoglycemia:9084048}. Otherwise, the patient {HAS/HAS NOT:522402} required any recent emergency interventions for hypoglycemia and {HAS/HAS NOT:522402} had recent hospitalizations secondary to hyper or hypoglycemic episodes.    ROS: As per HPI and as detailed below: ROS    HOME DIABETES REGIMEN:    Statin: *** ACE-I/ARB: *** Prior Diabetic Education: ***   METER DOWNLOAD SUMMARY: Date range evaluated: *** Fingerstick Blood Glucose Tests = *** Average Number Tests/Day = *** Overall Mean FS Glucose = *** Standard Deviation = ***  BG Ranges: Low = *** High =  ***   Hypoglycemic Events/30 Days: BG < 50 = *** Episodes of symptomatic severe hypoglycemia = ***    DIABETIC COMPLICATIONS: Microvascular complications:    Denies: CKD, retinopathy   Last eye exam: Completed 11/2018  Macrovascular complications:    Denies: CAD, PVD, CVA    HISTORY:  Past Medical History:  Past Medical History:  Diagnosis Date  . Chest pain    a. s/p normal ett nuclear stress test on 10/16/13  . Complication of anesthesia   . Diabetes mellitus without complication ([place])   . Dysrhythmia    fast heart rate  . GERD (gastroesophageal reflux disease)   . HLD (hyperlipidemia)   . Hypothyroidism   . Obesity   . Osteoarthrosis, unspecified whether generalized or localized, unspecified site   . Other chronic allergic conjunctivitis   . PONV (postoperative nausea and vomiting)   . Raynaud's syndrome   . Urticaria, unspecified     Past Surgical History:  Past Surgical History:  Procedure Laterality Date  . CHOLECYSTECTOMY    . CYST EXCISION Left 11/10/2014   Procedure: EXCISION CYST LEFT INDEX FINGER AND CYST LEFT SMALL FINGER ;  Surgeon: [name], MD;  Location: [place];  Service: Orthopedics;  Laterality: Left;  . ESOPHAGOGASTRODUODENOSCOPY N/A 01/05/2014   Procedure: ESOPHAGOGASTRODUODENOSCOPY (EGD);  Surgeon: [name], MD;  Location: [facility];  Service: Endoscopy;  Laterality: N/A;  . knot right breast    . LEFT HEART CATHETERIZATION WITH CORONARY ANGIOGRAM N/A 10/22/2013   Procedure: LEFT HEART CATHETERIZATION WITH CORONARY ANGIOGRAM;  Surgeon: [name], MD;  Location: [facility] [place] [facility];  Service: Cardiovascular;  Laterality: N/A;  . OOPHORECTOMY     LSO 84-RSO 04  . thyroid nodule    . TONSILLECTOMY    . VAGINAL HYSTERECTOMY  2004   LAVH RSO endometriosis  . VESICOVAGINAL FISTULA CLOSURE W/ TAH       Social History:  reports that she has never smoked. She has never used smokeless tobacco. She reports  current alcohol use. She reports that she does not use drugs. Family History:  Family History  Problem Relation Age of Onset  . Lung cancer Father   . Heart disease Father   . Heart failure Mother   . Dementia Mother   . Heart disease Maternal Uncle   . Lung cancer Paternal Uncle   . COPD Paternal Uncle       HOME MEDICATIONS: Allergies as of 05/12/2019      Reactions   Hydrocod Polst-cpm Polst Er Other (See Comments)   hyperactivity   Pseudoephedrine Itching, Palpitations, Other (See Comments)   REACTION: tachycardia   Sulfonamide Derivatives Anaphylaxis, Swelling   Azithromycin Itching   Buprenorphine Hcl Itching   Codeine Itching   Dilaudid [hydromorphone Hcl] Itching   Macrodantin [nitrofurantoin] Itching   Hands and feet itching. "Trouble breathing" w/lump in throat and stuffy nose.   Morphine And Related Itching   Shrimp [shellfish Allergy] Itching   Simvastatin Other (See Comments)   Fatigue, depression and dizziness at 20 mg   Atorvastatin    Muscle cramps  And her blood sugar kept going up   Cephalosporins Swelling   Said to cause facial swelling   Erythromycin Base Itching, Swelling   Hydrocodone-acetaminophen Other (See Comments)   Redness of face, swelling of tolerates tylenol   Penicillinase Itching   Sulfamethoxazole Itching   Clarithromycin Itching   REACTION: severe itching      Medication List       Accurate as of May 09, 2019  3:37 PM. If you have any questions, ask your nurse or doctor.        Accu-Chek Aviva Plus test strip Generic drug: glucose blood 1 each by Other route as needed (check blood sugar).   amitriptyline 25 MG tablet Commonly known as: ELAVIL Take 25 mg by mouth at bedtime.   azelastine 0.1 % nasal spray Commonly known as: ASTELIN Place into both nostrils as needed for rhinitis. Use in each nostril as directed   calcium-vitamin D 500-200 MG-UNIT tablet Commonly known as: OSCAL WITH D Take 1 tablet by mouth daily with  breakfast.   cetirizine 10 MG tablet Commonly known as: ZYRTEC Take 10 mg by mouth daily.   doxycycline 100 MG tablet Commonly known as: VIBRA-TABS Take 1 tablet (100 mg total) by mouth 2 (two) times daily.   doxycycline 100 MG tablet Commonly known as: VIBRA-TABS Take 2 tabs today, then 1 tab daily until gone   fluticasone 50 MCG/ACT nasal spray Commonly known as: FLONASE Place into both nostrils daily.   glipiZIDE 5 MG tablet Commonly known as: GLUCOTROL Take 1 tablet (5 mg total) by mouth daily before breakfast.   levothyroxine 100 MCG tablet Commonly known as: SYNTHROID Take 100 mcg by mouth daily.   Lutein 20 MG Caps Take 1 capsule by mouth daily.   metoprolol succinate 50 MG 24 hr tablet Commonly known as: TOPROL-XL TAKE 1 TABLET BY MOUTH EVERY DAY WITH OR IMMEDIATELY FOLLOWING A MEAL   omeprazole 20 MG capsule Commonly known as: PRILOSEC Take 1 capsule (20  mg total) by mouth 2 (two) times daily before a meal.   Shingrix injection Generic drug: Zoster Vaccine Adjuvanted        OBJECTIVE:   Vital Signs: There were no vitals taken for this visit.  Wt Readings from Last 3 Encounters:  04/18/19 222 lb 12.8 oz (101.1 kg)  09/26/18 223 lb (101.2 kg)  06/10/18 221 lb (100.2 kg)     Exam: General: Pt appears well and is in NAD  Hydration: Well-hydrated with moist mucous membranes and good skin turgor  HEENT: Head: Unremarkable with good dentition. Oropharynx clear without exudate.  Eyes: External eye exam normal without stare, lid lag or exophthalmos.  EOM intact.  PERRL.  Neck: General: Supple without adenopathy. Thyroid: Thyroid size normal.  No goiter or nodules appreciated. No thyroid bruit.  Lungs: Clear with good BS bilat with no rales, rhonchi, or wheezes  Heart: RRR with normal S1 and S2 and no gallops; no murmurs; no rub  Abdomen: Normoactive bowel sounds, soft, nontender, without masses or organomegaly palpable  Extremities: No pretibial edema.  No tremor. Normal strength and motion throughout. See detailed diabetic foot exam below.  Skin: Normal texture and temperature to palpation. No rash noted. No Acanthosis nigricans/skin tags. No lipohypertrophy.  Neuro: MS is good with appropriate affect, pt is alert and Ox3    DM foot exam: Please see diabetic assessment flow-sheet detailed below:           DATA REVIEWED:  Lab Results  Component Value Date   HGBA1C 7.0 (A) 04/18/2019   HGBA1C 7.0 (H) 10/16/2013   HGBA1C 7.2 (H) 10/16/2013   Lab Results  Component Value Date   LDLCALC 114 06/21/2015   CREATININE 0.63 12/13/2013   No results found for: [facility]   Lab Results  Component Value Date   CHOL 223 (H) 06/21/2015   HDL 59 06/21/2015   LDLCALC 114 06/21/2015   TRIG 249 (H) 06/21/2015   CHOLHDL 3.8 06/21/2015         ASSESSMENT / PLAN / RECOMMENDATIONS:   1) Type 2 Diabetes Mellitus, Optimally controlled, Without complications - Most recent A1c of 7.0 %. Goal A1c < 7.0 %.      MEDICATIONS:  ***  EDUCATION / INSTRUCTIONS:  BG monitoring instructions: Patient is instructed to check her blood sugars *** times a day, ***.  Call [place] [facility] if: BG persistently < 70 or > 300. . I reviewed the Rule of 15 for the treatment of hypoglycemia in detail with the patient. Literature supplied.   2) Diabetic complications:   Eye: Does *** have known diabetic retinopathy.   Neuro/ Feet: Does *** have known diabetic peripheral neuropathy .   Renal: Patient does *** have known baseline CKD. She   is *** on an ACEI/ARB at present. Check urine albumin/creatinine ratio yearly starting at time of diagnosis. If albuminuria is positive, treatment is geared toward better glucose, blood pressure control and use of ACE inhibitors or ARBs. Monitor electrolytes and creatinine once to twice yearly.   3) Lipids: Patient is *** on a statin.  4) Hypertension: *** at goal of < 140/90 mmHg.    F/U in ***     Signed electronically by: Mack Guise, MD  [facility]  [facility] [address] [place], [place] [address] Phone: [phone] FAX: [phone]   CC: Leeroy Cha, [place] [place] [address] Phone: [phone]  Fax: [phone]  Return to Endocrinology  clinic as below: Future Appointments  Date Time Provider [place]  05/12/2019  9:10 AM Analuisa Tudor, Melanie Crazier, MD [facility] None  06/16/2019  1:30 PM Deneise Lever, MD [facility] None  07/11/2019  1:20 PM Danyal Adorno, Melanie Crazier, MD [facility] None

## 2019-05-09 NOTE — Telephone Encounter (Signed)
Jill Dean, the pt called I believe yesterday saying that Glipizide had given her a rash and we said to stop it, can you ask her if she has Glimepiride ( she was on it before) ? If she does , then she can take it and see how she does with that (sugar and rash wise) if things are good , she can cancel her Monday appointment but if things are not good then keep the appointment     Thanks

## 2019-05-12 ENCOUNTER — Ambulatory Visit: Payer: Medicare Other | Admitting: Internal Medicine

## 2019-05-12 NOTE — Telephone Encounter (Signed)
Spoke to pt to see how med adjustment went over the weekend. Readings were as follows: 5/8 162 a.m 93 lunch 5/9 144 a.m. 5/10 162 a.m. 138 lunch

## 2019-05-26 DIAGNOSIS — M85859 Other specified disorders of bone density and structure, unspecified thigh: Secondary | ICD-10-CM | POA: Diagnosis not present

## 2019-05-26 DIAGNOSIS — E785 Hyperlipidemia, unspecified: Secondary | ICD-10-CM | POA: Diagnosis not present

## 2019-05-26 DIAGNOSIS — E1165 Type 2 diabetes mellitus with hyperglycemia: Secondary | ICD-10-CM | POA: Diagnosis not present

## 2019-06-12 ENCOUNTER — Other Ambulatory Visit: Payer: Self-pay

## 2019-06-12 ENCOUNTER — Ambulatory Visit (INDEPENDENT_AMBULATORY_CARE_PROVIDER_SITE_OTHER): Payer: Medicare Other | Admitting: Cardiology

## 2019-06-12 ENCOUNTER — Encounter: Payer: Self-pay | Admitting: Cardiology

## 2019-06-12 VITALS — BP 120/70 | HR 93 | Ht 62.0 in | Wt 229.0 lb

## 2019-06-12 DIAGNOSIS — R002 Palpitations: Secondary | ICD-10-CM

## 2019-06-12 DIAGNOSIS — E785 Hyperlipidemia, unspecified: Secondary | ICD-10-CM

## 2019-06-12 DIAGNOSIS — E1169 Type 2 diabetes mellitus with other specified complication: Secondary | ICD-10-CM

## 2019-06-12 DIAGNOSIS — I471 Supraventricular tachycardia, unspecified: Secondary | ICD-10-CM

## 2019-06-12 DIAGNOSIS — I4510 Unspecified right bundle-branch block: Secondary | ICD-10-CM | POA: Diagnosis not present

## 2019-06-12 NOTE — Progress Notes (Signed)
Cardiology Office Note:    Date:  06/12/2019   ID:  Jill Dean, DOB 01-17-1945, MRN [id number]  PCP:  Leeroy Cha, MD  [name] Cardiologist:  Candee Furbish, MD  [facility] Electrophysiologist:  None   Referring MD: Leeroy Cha,*     History of Present Illness:    Jill Dean is a 74 y.o. female withpresumed supraventricular tachycardia of 200 bpm per EMS on monitor, no strips, that resolved with vagal maneuvers in early October 2015. She was placed on diltiazem but felt poorly on this medication and this was stopped. Subsequently, monitoring demonstrated in December 2015 PVC/PACs and sinus tachycardia only. No adverse arrhythmias. We started Toprol-XL 25 mg once a day.  Maybe has 1 min episode of PSVT, Valsalva maneuver still work. She's not having any chest pain. She does have leg cramps, calf cramps at night.  Had cardiac catheterization which was unremarkable. Echocardiogram was reassuring. Treadmill test was reassuring. Does not wish to take statins.  After leaving hospital, she had a brief episode of tachycardia arrhythmia which terminated with Valsalva. No syncope, no chest pain.  Excited about her daughter, she is a grandmother.  She tried the atorvastatin but stopped after proximally 6 days. Her blood sugar kept increasing and she had worsening cramps she states.  She also had another episode of PSVT transiently when being tired/exhausted.  06/28/2017-since my last visit, she has seen [name] on 11/22/2016. Continue with watchful waiting. Beta-blocker. Avoiding alcohol caffeine. Daily exercise. He did not think she had an atrial tachycardia. 30-day event monitor reviewed sinus rhythm and sinus tachycardia. No SVT. Overall she has been doing quite well. Very rarely she felt any palpitations since her visit with [name]. She knows to take additional AV nodal blocking agent if necessary. She did however hit her foot on a  metal table, broke her toe. In a flat shoe support, Dr. Sharol Given.  06/10/2018- overall been doing quite well.  She stated that she had 1 brief episode of palpitations that lasted "1 second "the other morning.  Otherwise she is doing well without any chest pain fevers chills nausea vomiting syncope bleeding.  Tolerating her Toprol well.  Previously did not do well on statins.  06/12/2019-here for the follow-up of tachycardia.  Overall been doing well.  No significant palpitations sometimes she would feel her heart rate 115 bpm and take an extra 25 metoprolol.  This seems to help.  She did have Covid back in December.  Most of her family did have this as well.  Fevers for couple days.  I did encourage vaccine for her.  Husband is taken care of by [name].  Mechanical valve.  Past Medical History:  Diagnosis Date  . Chest pain    a. s/p normal ett nuclear stress test on 10/16/13  . Complication of anesthesia   . Diabetes mellitus without complication ([place])   . Dysrhythmia    fast heart rate  . GERD (gastroesophageal reflux disease)   . HLD (hyperlipidemia)   . Hypothyroidism   . Obesity   . Osteoarthrosis, unspecified whether generalized or localized, unspecified site   . Other chronic allergic conjunctivitis   . PONV (postoperative nausea and vomiting)   . Raynaud's syndrome   . Urticaria, unspecified     Past Surgical History:  Procedure Laterality Date  . CHOLECYSTECTOMY    . CYST EXCISION Left 11/10/2014   Procedure: EXCISION CYST LEFT INDEX FINGER AND CYST LEFT SMALL FINGER ;  Surgeon:  [name], MD;  Location: [place];  Service: Orthopedics;  Laterality: Left;  . ESOPHAGOGASTRODUODENOSCOPY N/A 01/05/2014   Procedure: ESOPHAGOGASTRODUODENOSCOPY (EGD);  Surgeon: [name], MD;  Location: [facility];  Service: Endoscopy;  Laterality: N/A;  . knot right breast    . LEFT HEART CATHETERIZATION WITH CORONARY ANGIOGRAM N/A 10/22/2013   Procedure: LEFT HEART  CATHETERIZATION WITH CORONARY ANGIOGRAM;  Surgeon: [name], MD;  Location: [place] [facility];  Service: Cardiovascular;  Laterality: N/A;  . OOPHORECTOMY     LSO 84-RSO 04  . thyroid nodule    . TONSILLECTOMY    . VAGINAL HYSTERECTOMY  2004   LAVH RSO endometriosis  . VESICOVAGINAL FISTULA CLOSURE W/ TAH      Current Medications: Current Meds  Medication Sig  . ACCU-CHEK AVIVA PLUS test strip 1 each by Other route as needed (check blood sugar).   Marland Kitchen amitriptyline (ELAVIL) 25 MG tablet Take 25 mg by mouth at bedtime.    Marland Kitchen azelastine (ASTELIN) 0.1 % nasal spray Place into both nostrils as needed for rhinitis. Use in each nostril as directed  . calcium-vitamin D (OSCAL WITH D) 500-200 MG-UNIT tablet Take 1 tablet by mouth daily with breakfast.   . cetirizine (ZYRTEC) 10 MG tablet Take 10 mg by mouth daily.    Marland Kitchen doxycycline (VIBRA-TABS) 100 MG tablet Take 1 tablet (100 mg total) by mouth 2 (two) times daily.  Marland Kitchen doxycycline (VIBRA-TABS) 100 MG tablet Take 2 tabs today, then 1 tab daily until gone  . fluticasone (FLONASE) 50 MCG/ACT nasal spray Place into both nostrils daily.  Marland Kitchen glipiZIDE (GLUCOTROL) 5 MG tablet Take 1 tablet (5 mg total) by mouth daily before breakfast.  . levothyroxine (SYNTHROID, LEVOTHROID) 100 MCG tablet Take 100 mcg by mouth daily.    . Lutein 20 MG CAPS Take 1 capsule by mouth daily.    . metoprolol succinate (TOPROL-XL) 50 MG 24 hr tablet TAKE 1 TABLET BY MOUTH EVERY DAY WITH OR IMMEDIATELY FOLLOWING A MEAL  . omeprazole (PRILOSEC) 20 MG capsule Take 1 capsule (20 mg total) by mouth 2 (two) times daily before a meal.  . SHINGRIX injection      Allergies:   Hydrocod polst-cpm polst er, Pseudoephedrine, Sulfonamide derivatives, Azithromycin, Buprenorphine hcl, Codeine, Dilaudid [hydromorphone hcl], Macrodantin [nitrofurantoin], Morphine and related, Shrimp [shellfish allergy], Simvastatin, Atorvastatin, Cephalosporins, Erythromycin base, Hydrocodone-acetaminophen,  Penicillinase, Sulfamethoxazole, and Clarithromycin   Social History   Socioeconomic History  . Marital status: Married    Spouse name: Not on file  . Number of children: Not on file  . Years of education: Not on file  . Highest education level: Not on file  Occupational History  . Occupation: Beautician  Tobacco Use  . Smoking status: Never Smoker  . Smokeless tobacco: Never Used  Vaping Use  . Vaping Use: Never used  Substance and Sexual Activity  . Alcohol use: Yes    Alcohol/week: 0.0 standard drinks    Comment: RARE  . Drug use: No  . Sexual activity: Not Currently    Birth control/protection: Surgical    Comment: INTERCOURSE AGE UNKOWN , SEXUAL PARTNERS LESS THAN 5  Other Topics Concern  . Not on file  Social History Narrative  . Not on file   Social Determinants of Health   Financial Resource Strain:   . Difficulty of Paying Living Expenses:   Food Insecurity:   . Worried About Charity fundraiser in the Last Year:   . [employer]  of Food in the Last Year:   Transportation Needs:   . Film/video editor (Medical):   Marland Kitchen Lack of Transportation (Non-Medical):   Physical Activity:   . Days of Exercise per Week:   . Minutes of Exercise per Session:   Stress:   . Feeling of Stress :   Social Connections:   . Frequency of Communication with Friends and Family:   . Frequency of Social Gatherings with Friends and Family:   . Attends Religious Services:   . Active Member of Clubs or Organizations:   . Attends Archivist Meetings:   Marland Kitchen Marital Status:      Family History: The patient's family history includes COPD in her paternal uncle; Dementia in her mother; Heart disease in her father and maternal uncle; Heart failure in her mother; Lung cancer in her father and paternal uncle.  ROS:   Please see the history of present illness.     All other systems reviewed and are negative.  EKGs/Labs/Other Studies Reviewed:    The following studies were reviewed  today: Prior office visits reviewed  EKG:  EKG is  ordered today.  The ekg ordered today demonstrates sinus rhythm right bundle branch block 93 bpm left anterior fascicular block  Recent Labs: No results found for requested labs within last 8760 hours.  Recent Lipid Panel    Component Value Date/Time   CHOL 223 (H) 06/21/2015 1007   TRIG 249 (H) 06/21/2015 1007   HDL 59 06/21/2015 1007   CHOLHDL 3.8 06/21/2015 1007   VLDL 50 (H) 06/21/2015 1007   LDLCALC 114 06/21/2015 1007    Physical Exam:    VS:  BP 120/70   Pulse 93   Ht 5\' 2"  (1.575 m)   Wt 229 lb (103.9 kg)   SpO2 96%   BMI 41.88 kg/m     Wt Readings from Last 3 Encounters:  06/12/19 229 lb (103.9 kg)  04/18/19 222 lb 12.8 oz (101.1 kg)  09/26/18 223 lb (101.2 kg)     GEN:  Well nourished, well developed in no acute distress HEENT: Normal NECK: No JVD; No carotid bruits LYMPHATICS: No lymphadenopathy CARDIAC: RRR, no murmurs, rubs, gallops RESPIRATORY:  Clear to auscultation without rales, wheezing or rhonchi  ABDOMEN: Soft, non-tender, non-distended MUSCULOSKELETAL:  No edema; No deformity  SKIN: Warm and dry NEUROLOGIC:  Alert and oriented x 3 PSYCHIATRIC:  Normal affect   ASSESSMENT:    1. PSVT (paroxysmal supraventricular tachycardia) (HCC)   2. Palpitations   3. Hyperlipidemia associated with type 2 diabetes mellitus ([place])   4. RBBB    PLAN:    In order of problems listed above:  Paroxysmal supraventricular tachycardia - Has been doing very well on Toprol.  [name] has seen her in the past.  Doing well.  Interestingly, her aunt Rod Holler had syncope with her PSVT and EP ablated her in [place] with no further recurrence. - She will very rarely have a PVC or PAC.  Continue with Toprol.  Diabetes with hypertension - Continue to work on weight loss.  Prior hemoglobin A1c 7.0.  Blood pressure is under excellent control currently.  Medications reviewed. No changes  Hyperlipidemia - She has  struggled with statins in the past causing cramping.  Last LDL 96.  Triglycerides have been in the 300 range.  Diet, exercise.  Raynaud's disease/phenomenon - Stable.  Doing well.    Medication Adjustments/Labs and Tests Ordered: Current medicines are reviewed at length with the patient  today.  Concerns regarding medicines are outlined above.  Orders Placed This Encounter  Procedures  . EKG 12-Lead   No orders of the defined types were placed in this encounter.   Patient Instructions  Medication Instructions:  The current medical regimen is effective;  continue present plan and medications.  *If you need a refill on your cardiac medications before your next appointment, please call your pharmacy*  Follow-Up: At [facility], you and your health needs are our priority.  As part of our continuing mission to provide you with exceptional heart care, we have created designated Provider Care Teams.  These Care Teams include your primary Cardiologist (physician) and Advanced Practice Providers (APPs -  Physician Assistants and Nurse Practitioners) who all work together to provide you with the care you need, when you need it.  We recommend signing up for the patient portal called "MyChart".  Sign up information is provided on this After Visit Summary.  MyChart is used to connect with patients for Virtual Visits (Telemedicine).  Patients are able to view lab/test results, encounter notes, upcoming appointments, etc.  Non-urgent messages can be sent to your provider as well.   To learn more about what you can do with MyChart, go to [email or web address].    Your next appointment:   12 month(s)  The format for your next appointment:   In Person  Provider:   Candee Furbish, MD   Thank you for choosing [facility]!!         Signed, Candee Furbish, MD  06/12/2019 1:53 PM    [place]

## 2019-06-12 NOTE — Patient Instructions (Signed)
Medication Instructions:  The current medical regimen is effective;  continue present plan and medications.  *If you need a refill on your cardiac medications before your next appointment, please call your pharmacy*  Follow-Up: At [facility], you and your health needs are our priority.  As part of our continuing mission to provide you with exceptional heart care, we have created designated Provider Care Teams.  These Care Teams include your primary Cardiologist (physician) and Advanced Practice Providers (APPs -  Physician Assistants and Nurse Practitioners) who all work together to provide you with the care you need, when you need it.  We recommend signing up for the patient portal called "MyChart".  Sign up information is provided on this After Visit Summary.  MyChart is used to connect with patients for Virtual Visits (Telemedicine).  Patients are able to view lab/test results, encounter notes, upcoming appointments, etc.  Non-urgent messages can be sent to your provider as well.   To learn more about what you can do with MyChart, go to [email or web address].com.    Your next appointment:   12 month(s)  The format for your next appointment:   In Person  Provider:   Mark Skains, MD   Thank you for choosing [facility]!!      

## 2019-06-16 ENCOUNTER — Encounter: Payer: Self-pay | Admitting: Internal Medicine

## 2019-06-16 ENCOUNTER — Ambulatory Visit (INDEPENDENT_AMBULATORY_CARE_PROVIDER_SITE_OTHER): Payer: Medicare Other | Admitting: Internal Medicine

## 2019-06-16 ENCOUNTER — Other Ambulatory Visit: Payer: Self-pay

## 2019-06-16 DIAGNOSIS — J3089 Other allergic rhinitis: Secondary | ICD-10-CM | POA: Diagnosis not present

## 2019-06-16 DIAGNOSIS — J302 Other seasonal allergic rhinitis: Secondary | ICD-10-CM

## 2019-06-16 DIAGNOSIS — I471 Supraventricular tachycardia: Secondary | ICD-10-CM

## 2019-06-16 NOTE — Assessment & Plan Note (Signed)
Being followed by cardiology without major events recently.

## 2019-06-16 NOTE — Patient Instructions (Signed)
Ok to continue current meds- please call for refills and if we can helpful.

## 2019-06-16 NOTE — Assessment & Plan Note (Signed)
Mild spring symptoms this year  because she was inside more and wearing mask. Plan- continue zyrtec and flonase as needed, as discussed

## 2019-06-16 NOTE — Progress Notes (Signed)
    Patient ID: Jill Dean, female    DOB: [birth year], 74 y.o.   MRN: [id number]  HPI female never smoker followed for allergic rhinitis/conjunctivitis, complicated by history Raynaud's syndrome, GERD, urticaria, DM, PSVT  ---------------------------------------------------------------------------------------------------  04/21/19-  Virtual Visit via Telephone Note  History of Present Illness: 74 year old female never smoker followed for allergic rhinitis/conjunctivitis, complicated by history Raynaud's syndrome, GERD, urticaria, DM2, Hypothyroid, Dyslipidemia, PSVT Zyrtec, Flonase, Astelin Resolved a sinus infection with doxycyline last month. Was well until exposed to 87 YO grandchild a few days ago, who got sick at daycare. She has now had 3 days of cough, postnasal drainage, fever. Taking Zyrtec. No Covax Planning trip to beach soon.  I encouraged Covax.   Observations/Objective:   Assessment and Plan: URI with rhinosinusitis and bronchitis- plan fluids, doxycycline  Follow Up Instructions: As scheduled June 14   06/16/19- 74 year old female never smoker followed for allergic rhinitis/conjunctivitis, complicated by history Raynaud's syndrome, GERD, urticaria, DM2, Hypothyroid, Dyslipidemia, PSVT Zyrtec, Flonase, Astelin Had Covid infection Dec, 2021- sinusitis and few days of fever, tested positive, managed at home. Acute bronchitis in the Spring- caught from grandchild, resolved. Mild pollen rhinitis this year- credits mask, but still has flonase and zyrtec when needed.   Review of Systems-See HPI + = positive Constitutional:   No-   weight loss, night sweats, fevers, chills, fatigue, lassitude. HEENT:    headaches, difficulty swallowing, tooth/dental problems, +sore throat,       sneezing, itching, ear ache, +nasal congestion, +post nasal drip,  CV:  No-   chest pain, orthopnea, PND, swelling in lower extremities, anasarca,  dizziness, palpitations Resp: No-   shortness of  breath with exertion or at rest.            +-productive cough,  No non-productive cough,  No-  coughing up of blood.              -change in color of mucus.  No- wheezing.    Objective:   Physical Exam General- Alert, Oriented, Affect-appropriate, Distress- none acute  +Obese Skin- rash-none, lesions- none, excoriation- none Lymphadenopathy- none Head- atraumatic            Eyes- Gross vision intact, PERRLA, conjunctivae clear secretions, +periorbital edema            Ears- Hearing,             Nose- clear , No-Septal dev,  polyps, erosion, perforation,             Throat- Mallampati III-IV , mucosa clear-not red , drainage- none, tonsils- atrophic.  Neck- flexible , trachea midline, no stridor , thyroid nl, carotid no bruit Chest -            Lung- clear to P&A but diminished, wheeze- none, cough- none , dullness-none, rub- none           Cardiac- 1 extra beat, otw RRR           Chest wall-  Abd-  Br/ Gen/ Rectal- Not done, not indicated Extrem- cyanosis- none, clubbing, none, atrophy- none, strength- nl, + superficial varices Neuro- grossly intact to observation

## 2019-06-17 ENCOUNTER — Encounter: Payer: Self-pay | Admitting: Nurse Practitioner

## 2019-06-17 ENCOUNTER — Other Ambulatory Visit: Payer: Self-pay

## 2019-06-17 ENCOUNTER — Ambulatory Visit (INDEPENDENT_AMBULATORY_CARE_PROVIDER_SITE_OTHER): Payer: Medicare Other | Admitting: Nurse Practitioner

## 2019-06-17 VITALS — BP 126/80

## 2019-06-17 DIAGNOSIS — R350 Frequency of micturition: Secondary | ICD-10-CM | POA: Diagnosis not present

## 2019-06-17 DIAGNOSIS — B3731 Acute candidiasis of vulva and vagina: Secondary | ICD-10-CM

## 2019-06-17 DIAGNOSIS — B373 Candidiasis of vulva and vagina: Secondary | ICD-10-CM

## 2019-06-17 DIAGNOSIS — N898 Other specified noninflammatory disorders of vagina: Secondary | ICD-10-CM | POA: Diagnosis not present

## 2019-06-17 LAB — URINALYSIS, COMPLETE W/RFL CULTURE
Bacteria, UA: NONE SEEN /HPF
Bilirubin Urine: NEGATIVE
Glucose, UA: NEGATIVE
Hgb urine dipstick: NEGATIVE
Hyaline Cast: NONE SEEN /LPF
Ketones, ur: NEGATIVE
Leukocyte Esterase: NEGATIVE
Nitrites, Initial: NEGATIVE
Protein, ur: NEGATIVE
RBC / HPF: NONE SEEN /HPF (ref 0–2)
Specific Gravity, Urine: 1.005 (ref 1.001–1.03)
WBC, UA: NONE SEEN /HPF (ref 0–5)
pH: 5 (ref 5.0–8.0)

## 2019-06-17 LAB — WET PREP FOR TRICH, YEAST, CLUE

## 2019-06-17 LAB — NO CULTURE INDICATED

## 2019-06-17 MED ORDER — FLUCONAZOLE 150 MG PO TABS: 150.0000 mg | ORAL_TABLET | ORAL | 1 refills | Status: DC

## 2019-06-17 NOTE — Progress Notes (Signed)
   Acute Office Visit  Subjective:    Patient ID: LODIE Dean, female    DOB: [birth year], 74 y.o.   MRN: [id number]  Chief Complaint  Patient presents with  . Vaginal Itching    jk  . Urinary Frequency    HPI 74 year old presents with vaginal itching and urinary frequency that began 5 days ago. Type 2 diabetic with recurrent yeast infections with most recent 03/2019. Was recently treated with a 3 week course of antibiotic for bronchitis.    Review of Systems  Constitutional: Negative.   Gastrointestinal: Negative.   Genitourinary: Positive for frequency. Negative for dysuria, hematuria, urgency and vaginal discharge.       Vaginal itching       Objective:    Physical Exam Constitutional:      Appearance: Normal appearance.  Abdominal:     General: There is no distension.     Tenderness: There is no abdominal tenderness. There is no right CVA tenderness or left CVA tenderness.  Genitourinary:    General: Normal vulva.     Vagina: Normal. No vaginal discharge or erythema.     Comments: Cervix and uterus absent    BP 126/80 (BP Location: Right Arm, Patient Position: Sitting, Cuff Size: Large)  Wt Readings from Last 3 Encounters:  06/16/19 229 lb 6.4 oz (104.1 kg)  06/12/19 229 lb (103.9 kg)  04/18/19 222 lb 12.8 oz (101.1 kg)    UA negative Wet prep: + yeast    Assessment & Plan:   Problem List Items Addressed This Visit    None    Visit Diagnoses    Urinary frequency    -  Primary   Relevant Orders   Urinalysis,Complete w/RFL Culture   Vaginal itching       Relevant Orders   WET PREP FOR TRICH, YEAST, CLUE   Vaginal candidiasis       Relevant Medications   fluconazole (DIFLUCAN) 150 MG tablet     Plan: UA unremarkable.  Wet prep positive for yeast.  Diflucan 150 mg 1 dose today, repeat in 72 hours.  1 refill given.  Discussed management of diabetes and decreasing sugar in the diet will help to prevent yeast infections.  Follow-up as needed if symptoms  worsen or do not improve.     Tamela Gammon [facility], 12:38 PM 06/17/2019

## 2019-06-17 NOTE — Patient Instructions (Signed)
Vaginal Yeast Infection, Adult  Vaginal yeast infection is a condition that causes vaginal discharge as well as soreness, swelling, and redness (inflammation) of the vagina. This is a common condition. Some women get this infection frequently. What are the causes? This condition is caused by a change in the normal balance of the yeast (candida) and bacteria that live in the vagina. This change causes an overgrowth of yeast, which causes the inflammation. What increases the risk? The condition is more likely to develop in women who:  Take antibiotic medicines.  Have diabetes.  Take birth control pills.  Are pregnant.  Douche often.  Have a weak body defense system (immune system).  Have been taking steroid medicines for a long time.  Frequently wear tight clothing. What are the signs or symptoms? Symptoms of this condition include:  White, thick, creamy vaginal discharge.  Swelling, itching, redness, and irritation of the vagina. The lips of the vagina (vulva) may be affected as well.  Pain or a burning feeling while urinating.  Pain during sex. How is this diagnosed? This condition is diagnosed based on:  Your medical history.  A physical exam.  A pelvic exam. Your health care provider will examine a sample of your vaginal discharge under a microscope. Your health care provider may send this sample for testing to confirm the diagnosis. How is this treated? This condition is treated with medicine. Medicines may be over-the-counter or prescription. You may be told to use one or more of the following:  Medicine that is taken by mouth (orally).  Medicine that is applied as a cream (topically).  Medicine that is inserted directly into the vagina (suppository). Follow these instructions at home:  Lifestyle  Do not have sex until your health care provider approves. Tell your sex partner that you have a yeast infection. That person should go to his or her health care  provider and ask if they should also be treated.  Do not wear tight clothes, such as pantyhose or tight pants.  Wear breathable cotton underwear. General instructions  Take or apply over-the-counter and prescription medicines only as told by your health care provider.  Eat more yogurt. This may help to keep your yeast infection from returning.  Do not use tampons until your health care provider approves.  Try taking a sitz bath to help with discomfort. This is a warm water bath that is taken while you are sitting down. The water should only come up to your hips and should cover your buttocks. Do this 3-4 times per day or as told by your health care provider.  Do not douche.  If you have diabetes, keep your blood sugar levels under control.  Keep all follow-up visits as told by your health care provider. This is important. Contact a health care provider if:  You have a fever.  Your symptoms go away and then return.  Your symptoms do not get better with treatment.  Your symptoms get worse.  You have new symptoms.  You develop blisters in or around your vagina.  You have blood coming from your vagina and it is not your menstrual period.  You develop pain in your abdomen. Summary  Vaginal yeast infection is a condition that causes discharge as well as soreness, swelling, and redness (inflammation) of the vagina.  This condition is treated with medicine. Medicines may be over-the-counter or prescription.  Take or apply over-the-counter and prescription medicines only as told by your health care provider.  Do not douche.   Do not have sex or use tampons until your health care provider approves.  Contact a health care provider if your symptoms do not get better with treatment or your symptoms go away and then return. This information is not intended to replace advice given to you by your health care provider. Make sure you discuss any questions you have with your health care  provider. Document Revised: 07/19/2018 Document Reviewed: 05/07/2017 Elsevier Patient Education  2020 Elsevier Inc.  

## 2019-06-23 ENCOUNTER — Ambulatory Visit: Payer: Medicare Other | Admitting: Internal Medicine

## 2019-07-11 ENCOUNTER — Other Ambulatory Visit: Payer: Self-pay

## 2019-07-11 ENCOUNTER — Encounter: Payer: Self-pay | Admitting: Internal Medicine

## 2019-07-11 ENCOUNTER — Ambulatory Visit (INDEPENDENT_AMBULATORY_CARE_PROVIDER_SITE_OTHER): Payer: Medicare Other | Admitting: Internal Medicine

## 2019-07-11 VITALS — BP 136/68 | HR 78 | Ht 64.0 in | Wt 231.0 lb

## 2019-07-11 DIAGNOSIS — E119 Type 2 diabetes mellitus without complications: Secondary | ICD-10-CM | POA: Diagnosis not present

## 2019-07-11 LAB — POCT GLYCOSYLATED HEMOGLOBIN (HGB A1C): Hemoglobin A1C: 6.9 % — AB (ref 4.0–5.6)

## 2019-07-11 NOTE — Patient Instructions (Addendum)
-   Keep up the good work ! - Continue Glimepiride 2 mg, 1 tablet 20-30 minutes before breakfast      Choose healthy, lower carb lower calorie snacks: toss salad, cooked vegetables, cottage cheese, peanut butter, low fat cheese / string cheese, lower sodium deli meat, tuna salad or chicken salad     HOW TO TREAT LOW BLOOD SUGARS (Blood sugar LESS THAN 70 MG/DL)  Please follow the RULE OF 15 for the treatment of hypoglycemia treatment (when your (blood sugars are less than 70 mg/dL)    STEP 1: Take 15 grams of carbohydrates when your blood sugar is low, which includes:   3-4 GLUCOSE TABS  OR  3-4 OZ OF JUICE OR REGULAR SODA OR  ONE TUBE OF GLUCOSE GEL     STEP 2: RECHECK blood sugar in 15 MINUTES STEP 3: If your blood sugar is still low at the 15 minute recheck --> then, go back to STEP 1 and treat AGAIN with another 15 grams of carbohydrates.

## 2019-07-11 NOTE — Progress Notes (Signed)
Name: Jill Dean  Age/ Sex: 74 y.o., female   MRN/ DOB: [id number], 07-26-1945     PCP: Leeroy Cha, MD   Reason for Endocrinology Evaluation: Type 2 Diabetes Mellitus  Initial Endocrine Consultative Visit: 04/18/2019    PATIENT IDENTIFIER: Jill Dean is a 74 y.o. female with a past medical history of T2DM, Hypothyroidism and Dyslipidemia. The patient has followed with Endocrinology clinic since 04/18/2019 for consultative assistance with management of her diabetes.  DIABETIC HISTORY:  Jill Dean was diagnosed with DM in 2013. Metformin - GI issues, Jardiance - recurrent yeast infection, Glipizide- Hives . Her hemoglobin A1c has ranged from 7.0% in 2015, peaking at 7.8% in 2020.  In her initial visit to our clinic she had an A1c of 7.0%, we stopped Jardiance due to recurrent yeast infections. She is allergic due to Tiburones:   During the last visit (04/18/2019): A1c 7.0% , we switched Glimepiride to Glipizide , stopped Jardiance   Today (07/11/2019): Jill Dean is here for a follow up on diabetes management. She developed a reaction to Glipizide and switched back to Glimepiride.  She checks her blood sugars 2 times daily. The patient has not had hypoglycemic episodes since the last clinic visit.    HOME DIABETES REGIMEN:  Glimepiride 2 mg daily   Statin: Intolerant ACE-I/ARB: no    METER DOWNLOAD SUMMARY: Date range evaluated: 6/26-07/11/2019 Fingerstick Blood Glucose Tests = 23 Average Number Tests/Day = 1.6 Overall Mean FS Glucose = 160  BG Ranges: Low = 109 High = 196   Hypoglycemic Events/30 Days: BG < 50 = 0 Episodes of symptomatic severe hypoglycemia = 0    DIABETIC COMPLICATIONS: Microvascular complications:    Denies: CKD, retinopathy   Last eye exam: Completed 11/2018  Macrovascular complications:    Denies: CAD, PVD, CVA    HISTORY:  Past Medical History:  Past Medical History:  Diagnosis Date  . Chest pain    a.  s/p normal ett nuclear stress test on 10/16/13  . Complication of anesthesia   . COVID-19 12/2019  . Diabetes mellitus without complication ([place])   . Dysrhythmia    fast heart rate  . GERD (gastroesophageal reflux disease)   . HLD (hyperlipidemia)   . Hypothyroidism   . Obesity   . Osteoarthrosis, unspecified whether generalized or localized, unspecified site   . Other chronic allergic conjunctivitis   . PONV (postoperative nausea and vomiting)   . Raynaud's syndrome   . Urticaria, unspecified    Past Surgical History:  Past Surgical History:  Procedure Laterality Date  . CHOLECYSTECTOMY    . CYST EXCISION Left 11/10/2014   Procedure: EXCISION CYST LEFT INDEX FINGER AND CYST LEFT SMALL FINGER ;  Surgeon: [name], MD;  Location: [facility];  Service: Orthopedics;  Laterality: Left;  . ESOPHAGOGASTRODUODENOSCOPY N/A 01/05/2014   Procedure: ESOPHAGOGASTRODUODENOSCOPY (EGD);  Surgeon: [name], MD;  Location: [facility];  Service: Endoscopy;  Laterality: N/A;  . knot right breast    . LEFT HEART CATHETERIZATION WITH CORONARY ANGIOGRAM N/A 10/22/2013   Procedure: LEFT HEART CATHETERIZATION WITH CORONARY ANGIOGRAM;  Surgeon: [name], MD;  Location: [facility];  Service: Cardiovascular;  Laterality: N/A;  . OOPHORECTOMY     LSO 84-RSO 04  . thyroid nodule    . TONSILLECTOMY    . VAGINAL HYSTERECTOMY  2004   LAVH RSO endometriosis  . VESICOVAGINAL FISTULA CLOSURE W/ TAH      Social History:  reports that she has never smoked. She has never used smokeless tobacco. She reports current alcohol use. She reports that she does not use drugs. Family History:  Family History  Problem Relation Age of Onset  . Lung cancer Father   . Heart disease Father   . Heart failure Mother   . Dementia Mother   . Heart disease Maternal Uncle   . Lung cancer Paternal Uncle   . COPD Paternal Uncle      HOME MEDICATIONS: Allergies as of 07/11/2019      Reactions    Hydrocod Polst-cpm Polst Er Other (See Comments)   hyperactivity   Pseudoephedrine Itching, Palpitations, Other (See Comments)   REACTION: tachycardia   Sulfonamide Derivatives Anaphylaxis, Swelling   Azithromycin Itching   Buprenorphine Hcl Itching   Codeine Itching   Dilaudid [hydromorphone Hcl] Itching   Macrodantin [nitrofurantoin] Itching   Hands and feet itching. "Trouble breathing" w/lump in throat and stuffy nose.   Morphine And Related Itching   Shrimp [shellfish Allergy] Itching   Simvastatin Other (See Comments)   Fatigue, depression and dizziness at 20 mg   Atorvastatin    Muscle cramps  And her blood sugar kept going up   Cephalosporins Swelling   Said to cause facial swelling   Erythromycin Base Itching, Swelling   Hydrocodone-acetaminophen Other (See Comments)   Redness of face, swelling of tolerates tylenol   Penicillinase Itching   Sulfamethoxazole Itching   Clarithromycin Itching   REACTION: severe itching      Medication List       Accurate as of July 11, 2019  1:44 PM. If you have any questions, ask your nurse or doctor.        Accu-Chek Aviva Plus test strip Generic drug: glucose blood 1 each by Other route as needed (check blood sugar).   amitriptyline 25 MG tablet Commonly known as: ELAVIL Take 25 mg by mouth at bedtime.   azelastine 0.1 % nasal spray Commonly known as: ASTELIN Place into both nostrils as needed for rhinitis. Use in each nostril as directed   calcium-vitamin D 500-200 MG-UNIT tablet Commonly known as: OSCAL WITH D Take 1 tablet by mouth daily with breakfast.   cetirizine 10 MG tablet Commonly known as: ZYRTEC Take 10 mg by mouth daily.   doxycycline 100 MG tablet Commonly known as: VIBRA-TABS Take 1 tablet (100 mg total) by mouth 2 (two) times daily.   fluconazole 150 MG tablet Commonly known as: DIFLUCAN Take 1 tablet (150 mg total) by mouth every 3 (three) days.   fluticasone 50 MCG/ACT nasal spray Commonly  known as: FLONASE Place into both nostrils daily.   glimepiride 2 MG tablet Commonly known as: AMARYL Take 2 mg by mouth daily with breakfast.   levothyroxine 100 MCG tablet Commonly known as: SYNTHROID Take 100 mcg by mouth daily.   Lutein 20 MG Caps Take 1 capsule by mouth daily.   metoprolol succinate 50 MG 24 hr tablet Commonly known as: TOPROL-XL TAKE 1 TABLET BY MOUTH EVERY DAY WITH OR IMMEDIATELY FOLLOWING A MEAL   omeprazole 20 MG capsule Commonly known as: PRILOSEC Take 1 capsule (20 mg total) by mouth 2 (two) times daily before a meal.   Shingrix injection Generic drug: Zoster Vaccine Adjuvanted        OBJECTIVE:   Vital Signs: BP 136/68 (BP Location: Left Arm, Patient Position: Sitting, Cuff Size: Large)   Pulse 78   Ht 5\' 4"  (1.626 m)   Wt 231 lb (  104.8 kg)   SpO2 97%   BMI 39.65 kg/m   Wt Readings from Last 3 Encounters:  07/11/19 231 lb (104.8 kg)  06/16/19 229 lb 6.4 oz (104.1 kg)  06/12/19 229 lb (103.9 kg)     Exam: General: Pt appears well and is in NAD  Lungs: Clear with good BS bilat with no rales, rhonchi, or wheezes  Heart: RRR with normal S1 and S2 and no gallops; no murmurs; no rub  Extremities: No pretibial edema.   Neuro: MS is good with appropriate affect, pt is alert and Ox3   DM foot exam: 04/18/2019  The skin of the feet is intact without sores or ulcerations. The pedal pulses are 2+ on right and 2+ on left. The sensation is intact to a screening 5.07, 10 gram monofilament bilaterally   DATA REVIEWED:  Lab Results  Component Value Date   HGBA1C 6.9 (A) 07/11/2019   HGBA1C 7.0 (A) 04/18/2019   HGBA1C 7.0 (H) 10/16/2013   Lab Results  Component Value Date   LDLCALC 114 06/21/2015   CREATININE 0.63 12/13/2013    Lab Results  Component Value Date   CHOL 223 (H) 06/21/2015   HDL 59 06/21/2015   LDLCALC 114 06/21/2015   TRIG 249 (H) 06/21/2015   CHOLHDL 3.8 06/21/2015         ASSESSMENT / PLAN /  RECOMMENDATIONS:   1) Type 2 Diabetes Mellitus, Optimally controlled, Without complications - Most recent A1c of 6.9 %. Goal A1c < 7.0 %.     Patient is intolerant to Jardiance due to recurrent vaginal yeast infections, and intolerant to Metformin and Glipizide   Declined a referral to RD  Discussed low carb snacks at night if necessary    MEDICATIONS:  Continue Glimepiride 2 mg daily   EDUCATION / INSTRUCTIONS:  BG monitoring instructions: Patient is instructed to check her blood sugars 1 times a day, fasting .  Call [facility] if: BG persistently < 70      2) Diabetic complications:   Eye: Does not  have known diabetic retinopathy.   Neuro/ Feet: Does not have known diabetic peripheral neuropathy .   Renal: Patient does not have known baseline CKD. She   is not on an ACEI/ARB at present.     F/U in 6 months    Signed electronically by: Mack Guise, MD  [facility] End[facility]  [facility] [address] [place], [place] [address] Phone: [phone] FAX: [phone]   CC: Leeroy Cha, [address] [place] [address] Phone: [phone]  Fax: [phone]  Return to Endocrinology clinic as below: Future Appointments  Date Time Provider [place]  06/15/2020 11:00 AM Deneise Lever, MD [facility] None

## 2019-07-31 DIAGNOSIS — I87399 Chronic venous hypertension (idiopathic) with other complications of unspecified lower extremity: Secondary | ICD-10-CM | POA: Diagnosis not present

## 2019-07-31 DIAGNOSIS — E1165 Type 2 diabetes mellitus with hyperglycemia: Secondary | ICD-10-CM | POA: Diagnosis not present

## 2019-08-20 ENCOUNTER — Telehealth: Payer: Self-pay | Admitting: Internal Medicine

## 2019-08-20 ENCOUNTER — Other Ambulatory Visit: Payer: Self-pay

## 2019-08-20 MED ORDER — GLIMEPIRIDE 2 MG PO TABS: 2.0000 mg | ORAL_TABLET | Freq: Every day | ORAL | 1 refills | Status: DC

## 2019-08-20 NOTE — Telephone Encounter (Signed)
Rx sent 

## 2019-08-20 NOTE — Telephone Encounter (Signed)
Medication RX Request  Did you call your pharmacy and request this refill first? No  If patient has not contacted pharmacy first, instruct them to do so for future refills.   Remind them that contacting the pharmacy for their refill is the quickest method to get the refill.   Refill policy also stated that it will take anywhere between 24-72 hours to receive the refill.    Name of medication?  glimepiride (AMARYL) 2 MG tablet  Is this a 90 day supply? Yes  Name and location of pharmacy?  [id number] - [place], [place] Phone:  [phone]  Fax:  [phone]        Is the request for diabetes test strips? No  If yes, what brand? N/A

## 2019-08-31 ENCOUNTER — Other Ambulatory Visit: Payer: Self-pay | Admitting: Physician Assistant

## 2019-09-29 DIAGNOSIS — Z1231 Encounter for screening mammogram for malignant neoplasm of breast: Secondary | ICD-10-CM | POA: Diagnosis not present

## 2019-12-15 DIAGNOSIS — H5202 Hypermetropia, left eye: Secondary | ICD-10-CM | POA: Diagnosis not present

## 2019-12-15 DIAGNOSIS — H52203 Unspecified astigmatism, bilateral: Secondary | ICD-10-CM | POA: Diagnosis not present

## 2019-12-15 DIAGNOSIS — E119 Type 2 diabetes mellitus without complications: Secondary | ICD-10-CM | POA: Diagnosis not present

## 2019-12-23 ENCOUNTER — Telehealth: Payer: Self-pay

## 2019-12-23 NOTE — Telephone Encounter (Signed)
Patient called stating she has appt scheduled tomorrow morning but wondered if she could just get a prescription for yeast infection. Upon discussing her symptoms she mentioned pressure, burning and itching. We discussed that pressure is not a classic sx and might be best to keep the appointment so that we can check a urinalysis and rule out UTI.  She agreed this is what she would like to do. I told her she could apply some Monistat Cream externally tonight for relief but not to use anything intravaginally.

## 2019-12-24 ENCOUNTER — Other Ambulatory Visit: Payer: Self-pay

## 2019-12-24 ENCOUNTER — Encounter: Payer: Self-pay | Admitting: Nurse Practitioner

## 2019-12-24 ENCOUNTER — Ambulatory Visit: Payer: Medicare Other | Admitting: Nurse Practitioner

## 2019-12-24 ENCOUNTER — Ambulatory Visit (INDEPENDENT_AMBULATORY_CARE_PROVIDER_SITE_OTHER): Payer: Medicare Other | Admitting: Nurse Practitioner

## 2019-12-24 VITALS — BP 124/80

## 2019-12-24 DIAGNOSIS — N949 Unspecified condition associated with female genital organs and menstrual cycle: Secondary | ICD-10-CM | POA: Diagnosis not present

## 2019-12-24 DIAGNOSIS — B373 Candidiasis of vulva and vagina: Secondary | ICD-10-CM

## 2019-12-24 DIAGNOSIS — B3731 Acute candidiasis of vulva and vagina: Secondary | ICD-10-CM

## 2019-12-24 LAB — URINALYSIS W MICROSCOPIC + REFLEX CULTURE
Bacteria, UA: NONE SEEN /HPF
Bilirubin Urine: NEGATIVE
Glucose, UA: NEGATIVE
Hgb urine dipstick: NEGATIVE
Hyaline Cast: NONE SEEN /LPF
Ketones, ur: NEGATIVE
Leukocyte Esterase: NEGATIVE
Nitrites, Initial: NEGATIVE
Protein, ur: NEGATIVE
RBC / HPF: NONE SEEN /HPF (ref 0–2)
Specific Gravity, Urine: 1.005 (ref 1.001–1.03)
WBC, UA: NONE SEEN /HPF (ref 0–5)
pH: 5.5 (ref 5.0–8.0)

## 2019-12-24 LAB — NO CULTURE INDICATED

## 2019-12-24 LAB — WET PREP FOR TRICH, YEAST, CLUE

## 2019-12-24 MED ORDER — FLUCONAZOLE 150 MG PO TABS: 150.0000 mg | ORAL_TABLET | ORAL | 2 refills | Status: DC

## 2019-12-24 NOTE — Progress Notes (Signed)
   Acute Office Visit  Subjective:    Patient ID: Jill Dean, female    DOB: February 26, 1945, 74 y.o.   MRN: [id number]   HPI 74 y.o. presents today for vaginal itching/burning that started a couple of days ago. She is finishing up an antibiotic for a sinus infection. She is a diabetic with a history of recurrent yeast infections. These have improved since stopping Jardiance.   Review of Systems  Constitutional: Negative.   Genitourinary: Positive for vaginal pain (itching).       Objective:    Physical Exam Constitutional:      Appearance: Normal appearance.  Genitourinary:    General: Normal vulva.     Vagina: Vaginal discharge and erythema present.     BP 124/80 (BP Location: Right Arm, Patient Position: Sitting, Cuff Size: Normal)  Wt Readings from Last 3 Encounters:  07/11/19 231 lb (104.8 kg)  06/16/19 229 lb 6.4 oz (104.1 kg)  06/12/19 229 lb (103.9 kg)   Wet prep negative     Assessment & Plan:   Problem List Items Addressed This Visit   None   Visit Diagnoses    Vaginal candidiasis    -  Primary   Relevant Medications   fluconazole (DIFLUCAN) 150 MG tablet   Vaginal burning       Relevant Orders   Urinalysis w microscopic + reflex cultur     Plan: Wet prep unremarkable. Will treat due to antibiotic use and history of yeast infections. Diflucan 150 mg today and repeat in 3 days for total of 2 doses. If symptoms worsen or do not improve she will return to office. She is agreeable to plan.      Tamela Gammon [facility], 11:51 AM 12/24/2019

## 2019-12-24 NOTE — Addendum Note (Signed)
Addended by: Thamas Jaegers on: 12/24/2019 02:35 PM   Modules accepted: Orders

## 2020-01-09 NOTE — Progress Notes (Deleted)
Name: Jill Dean  Age/ Sex: 75 y.o., female   MRN/ DOB: [id number], 01/12/1945     PCP: Leeroy Cha, MD   Reason for Endocrinology Evaluation: Type 2 Diabetes Mellitus  Initial Endocrine Consultative Visit: 04/18/2019    PATIENT IDENTIFIER: Ms. BRANTLEE HINDE is a 75 y.o. female with a past medical history of T2DM, Hypothyroidism and Dyslipidemia. The patient has followed with Endocrinology clinic since 04/18/2019 for consultative assistance with management of her diabetes.  DIABETIC HISTORY:  Ms. Trevizo was diagnosed with DM in 2013. Metformin - GI issues, Jardiance - recurrent yeast infection, Glipizide- Hives . Her hemoglobin A1c has ranged from 7.0% in 2015, peaking at 7.8% in 2020.  In her initial visit to our clinic she had an A1c of 7.0%, we stopped Jardiance due to recurrent yeast infections. She is allergic due to Glipizide  SUBJECTIVE:   During the last visit (07/11/2019): A1c 6.8 % ,continued  Glimepiride  Today (01/09/2020): Ms. Caruthers is here for a follow up on diabetes management. She developed a reaction to Glipizide and switched back to Glimepiride.  She checks her blood sugars 2 times daily. The patient has not had hypoglycemic episodes since the last clinic visit.    HOME DIABETES REGIMEN:  Glimepiride 2 mg daily   Statin: Intolerant ACE-I/ARB: no    METER DOWNLOAD SUMMARY: Date range evaluated: 6/26-07/11/2019 Fingerstick Blood Glucose Tests = 23 Average Number Tests/Day = 1.6 Overall Mean FS Glucose = 160  BG Ranges: Low = 109 High = 196   Hypoglycemic Events/30 Days: BG < 50 = 0 Episodes of symptomatic severe hypoglycemia = 0    DIABETIC COMPLICATIONS: Microvascular complications:    Denies: CKD, retinopathy   Last eye exam: Completed 11/2018  Macrovascular complications:    Denies: CAD, PVD, CVA    HISTORY:  Past Medical History:  Past Medical History:  Diagnosis Date  . Chest pain    a. s/p normal ett nuclear stress test  on 10/16/13  . Complication of anesthesia   . COVID-19 12/2019  . Diabetes mellitus without complication ([place])   . Dysrhythmia    fast heart rate  . GERD (gastroesophageal reflux disease)   . HLD (hyperlipidemia)   . Hypothyroidism   . Obesity   . Osteoarthrosis, unspecified whether generalized or localized, unspecified site   . Other chronic allergic conjunctivitis   . PONV (postoperative nausea and vomiting)   . Raynaud's syndrome   . Urticaria, unspecified    Past Surgical History:  Past Surgical History:  Procedure Laterality Date  . CHOLECYSTECTOMY    . CYST EXCISION Left 11/10/2014   Procedure: EXCISION CYST LEFT INDEX FINGER AND CYST LEFT SMALL FINGER ;  Surgeon: [name], MD;  Location: [facility];  Service: Orthopedics;  Laterality: Left;  . ESOPHAGOGASTRODUODENOSCOPY N/A 01/05/2014   Procedure: ESOPHAGOGASTRODUODENOSCOPY (EGD);  Surgeon: [name], MD;  Location: [facility];  Service: Endoscopy;  Laterality: N/A;  . knot right breast    . LEFT HEART CATHETERIZATION WITH CORONARY ANGIOGRAM N/A 10/22/2013   Procedure: LEFT HEART CATHETERIZATION WITH CORONARY ANGIOGRAM;  Surgeon: [name], MD;  Location: [facility];  Service: Cardiovascular;  Laterality: N/A;  . OOPHORECTOMY     LSO 84-RSO 04  . thyroid nodule    . TONSILLECTOMY    . VAGINAL HYSTERECTOMY  2004   LAVH RSO endometriosis  . VESICOVAGINAL FISTULA CLOSURE W/ TAH      Social History:  reports that she has never smoked.  She has never used smokeless tobacco. She reports current alcohol use. She reports that she does not use drugs. Family History:  Family History  Problem Relation Age of Onset  . Lung cancer Father   . Heart disease Father   . Heart failure Mother   . Dementia Mother   . Heart disease Maternal Uncle   . Lung cancer Paternal Uncle   . COPD Paternal Uncle      HOME MEDICATIONS: Allergies as of 01/12/2020      Reactions   Hydrocod Polst-cpm Polst Er Other  (See Comments)   hyperactivity   Pseudoephedrine Itching, Palpitations, Other (See Comments)   REACTION: tachycardia   Sulfonamide Derivatives Anaphylaxis, Swelling   Azithromycin Itching   Buprenorphine Hcl Itching   Codeine Itching   Dilaudid [hydromorphone Hcl] Itching   Macrodantin [nitrofurantoin] Itching   Hands and feet itching. "Trouble breathing" w/lump in throat and stuffy nose.   Morphine And Related Itching   Shrimp [shellfish Allergy] Itching   Simvastatin Other (See Comments)   Fatigue, depression and dizziness at 20 mg   Atorvastatin    Muscle cramps  And her blood sugar kept going up   Cephalosporins Swelling   Said to cause facial swelling   Erythromycin Base Itching, Swelling   Hydrocodone-acetaminophen Other (See Comments)   Redness of face, swelling of tolerates tylenol   Penicillinase Itching   Sulfamethoxazole Itching   Clarithromycin Itching   REACTION: severe itching      Medication List       Accurate as of January 09, 2020 11:55 AM. If you have any questions, ask your nurse or doctor.        Accu-Chek Aviva Plus test strip Generic drug: glucose blood 1 each by Other route as needed (check blood sugar).   amitriptyline 25 MG tablet Commonly known as: ELAVIL Take 25 mg by mouth at bedtime.   azelastine 0.1 % nasal spray Commonly known as: ASTELIN Place into both nostrils as needed for rhinitis. Use in each nostril as directed   calcium-vitamin D 500-200 MG-UNIT tablet Commonly known as: OSCAL WITH D Take 1 tablet by mouth daily with breakfast.   cetirizine 10 MG tablet Commonly known as: ZYRTEC Take 10 mg by mouth daily.   doxycycline 100 MG tablet Commonly known as: VIBRA-TABS Take 1 tablet (100 mg total) by mouth 2 (two) times daily.   fluconazole 150 MG tablet Commonly known as: DIFLUCAN Take 1 tablet (150 mg total) by mouth every 3 (three) days.   fluticasone 50 MCG/ACT nasal spray Commonly known as: FLONASE Place into both  nostrils daily.   glimepiride 2 MG tablet Commonly known as: AMARYL Take 1 tablet (2 mg total) by mouth daily with breakfast.   levothyroxine 100 MCG tablet Commonly known as: SYNTHROID Take 100 mcg by mouth daily.   Lutein 20 MG Caps Take 1 capsule by mouth daily.   metoprolol succinate 50 MG 24 hr tablet Commonly known as: TOPROL-XL TAKE 1 TABLET BY MOUTH EVERY DAY WITH OR IMMEDIATELY FOLLOWING A MEAL   omeprazole 20 MG capsule Commonly known as: PRILOSEC Take 1 capsule (20 mg total) by mouth 2 (two) times daily before a meal.   Shingrix injection Generic drug: Zoster Vaccine Adjuvanted        OBJECTIVE:   Vital Signs: There were no vitals taken for this visit.  Wt Readings from Last 3 Encounters:  07/11/19 231 lb (104.8 kg)  06/16/19 229 lb 6.4 oz (104.1 kg)  06/12/19 229  lb (103.9 kg)     Exam: General: Pt appears well and is in NAD  Lungs: Clear with good BS bilat with no rales, rhonchi, or wheezes  Heart: RRR with normal S1 and S2 and no gallops; no murmurs; no rub  Extremities: No pretibial edema.   Neuro: MS is good with appropriate affect, pt is alert and Ox3   DM foot exam: 04/18/2019  The skin of the feet is intact without sores or ulcerations. The pedal pulses are 2+ on right and 2+ on left. The sensation is intact to a screening 5.07, 10 gram monofilament bilaterally   DATA REVIEWED:  Lab Results  Component Value Date   HGBA1C 6.9 (A) 07/11/2019   HGBA1C 7.0 (A) 04/18/2019   HGBA1C 7.0 (H) 10/16/2013   Lab Results  Component Value Date   LDLCALC 114 06/21/2015   CREATININE 0.63 12/13/2013    Lab Results  Component Value Date   CHOL 223 (H) 06/21/2015   HDL 59 06/21/2015   LDLCALC 114 06/21/2015   TRIG 249 (H) 06/21/2015   CHOLHDL 3.8 06/21/2015         ASSESSMENT / PLAN / RECOMMENDATIONS:   1) Type 2 Diabetes Mellitus, Optimally controlled, Without complications - Most recent A1c of 6.9 %. Goal A1c < 7.0 %.     Patient is  intolerant to Jardiance due to recurrent vaginal yeast infections, and intolerant to Metformin and Glipizide   Declined a referral to RD  Discussed low carb snacks at night if necessary    MEDICATIONS:  Continue Glimepiride 2 mg daily   EDUCATION / INSTRUCTIONS:  BG monitoring instructions: Patient is instructed to check her blood sugars 1 times a day, fasting .  Call [facility] if: BG persistently < 70      2) Diabetic complications:   Eye: Does not  have known diabetic retinopathy.   Neuro/ Feet: Does not have known diabetic peripheral neuropathy .   Renal: Patient does not have known baseline CKD. She   is not on an ACEI/ARB at present.     F/U in 6 months    Signed electronically by: Mack Guise, MD  [facility]  [address] [place], [place] [address] Phone: [phone] FAX: [phone]   CC: Leeroy Cha, [place] [place] [address] Phone: [phone]  Fax: [phone]  Return to Endocrinology clinic as below: Future Appointments  Date Time Provider [place]  01/12/2020 11:10 AM Ari Engelbrecht, Melanie Crazier, MD [facility] None  06/15/2020 11:00 AM Deneise Lever, MD [facility] None

## 2020-01-12 ENCOUNTER — Ambulatory Visit: Payer: Medicare Other | Admitting: Internal Medicine

## 2020-01-15 ENCOUNTER — Telehealth: Payer: Self-pay | Admitting: Internal Medicine

## 2020-01-15 MED ORDER — DOXYCYCLINE HYCLATE 100 MG PO TABS
100.0000 mg | ORAL_TABLET | Freq: Two times a day (BID) | ORAL | 0 refills | Status: DC
Start: 2020-01-15 — End: 2020-03-08

## 2020-01-15 NOTE — Telephone Encounter (Signed)
Called and spoke with the pt  She states 1 wk ago she had 2 days of severe HA and fever  This is has resolved and she is now c/o nasal congestion- blowing out yellow/bloody nd  She denies any current f/c/s, aches, wheezing, SOB  She states feels she has a sinus infection  She is taking her flonase and zyrtec, no longer on astelin  She states that she had one covid vax only back in July 2021 and had bad reaction so no more  Please advise, thanks!

## 2020-01-15 NOTE — Telephone Encounter (Signed)
Please offer doxycycline 100 mg ., # 14, 1 twice daily  I do advise she get Covid tested- a lot of Covid infections are starting like a cold with sinus infection

## 2020-01-15 NOTE — Telephone Encounter (Signed)
Spoke with the pt and notified of recs per Dr Annamaria Boots  She verbalized understanding Rx sent to pharm  She weill get covid test  Will call if not improving

## 2020-01-29 DIAGNOSIS — B079 Viral wart, unspecified: Secondary | ICD-10-CM | POA: Diagnosis not present

## 2020-01-29 DIAGNOSIS — L821 Other seborrheic keratosis: Secondary | ICD-10-CM | POA: Diagnosis not present

## 2020-01-29 DIAGNOSIS — L819 Disorder of pigmentation, unspecified: Secondary | ICD-10-CM | POA: Diagnosis not present

## 2020-01-29 DIAGNOSIS — D1801 Hemangioma of skin and subcutaneous tissue: Secondary | ICD-10-CM | POA: Diagnosis not present

## 2020-01-29 DIAGNOSIS — I8393 Asymptomatic varicose veins of bilateral lower extremities: Secondary | ICD-10-CM | POA: Diagnosis not present

## 2020-01-29 DIAGNOSIS — B351 Tinea unguium: Secondary | ICD-10-CM | POA: Diagnosis not present

## 2020-01-29 DIAGNOSIS — D485 Neoplasm of uncertain behavior of skin: Secondary | ICD-10-CM | POA: Diagnosis not present

## 2020-01-29 DIAGNOSIS — L814 Other melanin hyperpigmentation: Secondary | ICD-10-CM | POA: Diagnosis not present

## 2020-01-29 DIAGNOSIS — D229 Melanocytic nevi, unspecified: Secondary | ICD-10-CM | POA: Diagnosis not present

## 2020-01-29 DIAGNOSIS — L249 Irritant contact dermatitis, unspecified cause: Secondary | ICD-10-CM | POA: Diagnosis not present

## 2020-02-09 ENCOUNTER — Other Ambulatory Visit: Payer: Self-pay | Admitting: Internal Medicine

## 2020-02-10 NOTE — Progress Notes (Signed)
Name: Jill Dean  Age/ Sex: 75 y.o., female   MRN/ DOB: [id number], April 08, 1945     PCP: Leeroy Cha, MD   Reason for Endocrinology Evaluation: Type 2 Diabetes Mellitus  Initial Endocrine Consultative Visit: 04/18/2019    PATIENT IDENTIFIER: Jill Dean is a 75 y.o. female with a past medical history of T2DM, Hypothyroidism and Dyslipidemia. The patient has followed with Endocrinology clinic since 04/18/2019 for consultative assistance with management of her diabetes.  DIABETIC HISTORY:  Jill Dean was diagnosed with DM in 2013. Metformin - GI issues, Jardiance - recurrent yeast infection, Glipizide- Hives . Her hemoglobin A1c has ranged from 7.0% in 2015, peaking at 7.8% in 2020.  In her initial visit to our clinic she had an A1c of 7.0%, we stopped Jardiance due to recurrent yeast infections. She is allergic due to Glipizide  SUBJECTIVE:   During the last visit (07/11/2019): A1c 6.8 % ,continued  Glimepiride  Today (02/11/2020): [name] is here for a follow up on diabetes management. She developed a reaction to Glipizide and switched back to Glimepiride.  She checks her blood sugars 2 times daily. The patient has not had hypoglycemic episodes since the last clinic visit.    HOME DIABETES REGIMEN:  Glimepiride 2 mg daily   Statin: Intolerant ACE-I/ARB: no    METER DOWNLOAD SUMMARY: Date range evaluated: 1/26-02/11/2020 Average Number Tests/Day = 1.3 Overall Mean FS Glucose = 176  BG Ranges: Low = 123 High = 226   Hypoglycemic Events/30 Days: BG < 50 = 0 Episodes of symptomatic severe hypoglycemia = 0    DIABETIC COMPLICATIONS: Microvascular complications:    Denies: CKD, retinopathy , neuropathy   Last eye exam: Completed 12/2019  Macrovascular complications:    Denies: CAD, PVD, CVA    HISTORY:  Past Medical History:  Past Medical History:  Diagnosis Date  . Chest pain    a. s/p normal ett nuclear stress test on 10/16/13  .  Complication of anesthesia   . COVID-19 12/2019  . Diabetes mellitus without complication ([place])   . Dysrhythmia    fast heart rate  . GERD (gastroesophageal reflux disease)   . HLD (hyperlipidemia)   . Hypothyroidism   . Obesity   . Osteoarthrosis, unspecified whether generalized or localized, unspecified site   . Other chronic allergic conjunctivitis   . PONV (postoperative nausea and vomiting)   . Raynaud's syndrome   . Urticaria, unspecified    Past Surgical History:  Past Surgical History:  Procedure Laterality Date  . CHOLECYSTECTOMY    . CYST EXCISION Left 11/10/2014   Procedure: EXCISION CYST LEFT INDEX FINGER AND CYST LEFT SMALL FINGER ;  Surgeon: [name], MD;  Location: [facility];  Service: Orthopedics;  Laterality: Left;  . ESOPHAGOGASTRODUODENOSCOPY N/A 01/05/2014   Procedure: ESOPHAGOGASTRODUODENOSCOPY (EGD);  Surgeon: [name], MD;  Location: [facility];  Service: Endoscopy;  Laterality: N/A;  . knot right breast    . LEFT HEART CATHETERIZATION WITH CORONARY ANGIOGRAM N/A 10/22/2013   Procedure: LEFT HEART CATHETERIZATION WITH CORONARY ANGIOGRAM;  Surgeon: [name], MD;  Location: [facility];  Service: Cardiovascular;  Laterality: N/A;  . OOPHORECTOMY     LSO 84-RSO 04  . thyroid nodule    . TONSILLECTOMY    . VAGINAL HYSTERECTOMY  2004   LAVH RSO endometriosis  . VESICOVAGINAL FISTULA CLOSURE W/ TAH      Social History:  reports that she has never smoked. She has never used  smokeless tobacco. She reports current alcohol use. She reports that she does not use drugs. Family History:  Family History  Problem Relation Age of Onset  . Lung cancer Father   . Heart disease Father   . Heart failure Mother   . Dementia Mother   . Heart disease Maternal Uncle   . Lung cancer Paternal Uncle   . COPD Paternal Uncle      HOME MEDICATIONS: Allergies as of 02/11/2020      Reactions   Hydrocod Polst-cpm Polst Er Other (See Comments)    hyperactivity   Pseudoephedrine Itching, Palpitations, Other (See Comments)   REACTION: tachycardia   Sulfonamide Derivatives Anaphylaxis, Swelling   Azithromycin Itching   Buprenorphine Hcl Itching   Codeine Itching   Dilaudid [hydromorphone Hcl] Itching   Macrodantin [nitrofurantoin] Itching   Hands and feet itching. "Trouble breathing" w/lump in throat and stuffy nose.   Morphine And Related Itching   Shrimp [shellfish Allergy] Itching   Simvastatin Other (See Comments)   Fatigue, depression and dizziness at 20 mg   Atorvastatin    Muscle cramps  And her blood sugar kept going up   Cephalosporins Swelling   Said to cause facial swelling   Erythromycin Base Itching, Swelling   Hydrocodone-acetaminophen Other (See Comments)   Redness of face, swelling of tolerates tylenol   Penicillinase Itching   Sulfamethoxazole Itching   Clarithromycin Itching   REACTION: severe itching      Medication List       Accurate as of February 11, 2020 10:12 AM. If you have any questions, ask your nurse or doctor.        Accu-Chek Aviva Plus test strip Generic drug: glucose blood 1 each by Other route as needed (check blood sugar).   amitriptyline 25 MG tablet Commonly known as: ELAVIL Take 25 mg by mouth at bedtime.   azelastine 0.1 % nasal spray Commonly known as: ASTELIN Place into both nostrils as needed for rhinitis. Use in each nostril as directed   calcium-vitamin D 500-200 MG-UNIT tablet Commonly known as: OSCAL WITH D Take 1 tablet by mouth daily with breakfast.   cetirizine 10 MG tablet Commonly known as: ZYRTEC Take 10 mg by mouth daily.   doxycycline 100 MG tablet Commonly known as: VIBRA-TABS Take 1 tablet (100 mg total) by mouth 2 (two) times daily.   doxycycline 100 MG tablet Commonly known as: VIBRA-TABS Take 1 tablet (100 mg total) by mouth 2 (two) times daily.   fluconazole 150 MG tablet Commonly known as: DIFLUCAN Take 1 tablet (150 mg total) by mouth  every 3 (three) days.   fluticasone 50 MCG/ACT nasal spray Commonly known as: FLONASE Place into both nostrils daily.   glimepiride 2 MG tablet Commonly known as: AMARYL TAKE 1 TABLET BY MOUTH DAILY WITH BREAKFAST   levothyroxine 100 MCG tablet Commonly known as: SYNTHROID Take 100 mcg by mouth daily.   Lutein 20 MG Caps Take 1 capsule by mouth daily.   metoprolol succinate 50 MG 24 hr tablet Commonly known as: TOPROL-XL TAKE 1 TABLET BY MOUTH EVERY DAY WITH OR IMMEDIATELY FOLLOWING A MEAL   omeprazole 20 MG capsule Commonly known as: PRILOSEC Take 1 capsule (20 mg total) by mouth 2 (two) times daily before a meal.   Shingrix injection Generic drug: Zoster Vaccine Adjuvanted        OBJECTIVE:   Vital Signs: BP 122/80   Pulse 88   Ht 5\' 4"  (1.626 m)   Wt 241  lb 2 oz (109.4 kg)   SpO2 99%   BMI 41.39 kg/m   Wt Readings from Last 3 Encounters:  02/11/20 241 lb 2 oz (109.4 kg)  07/11/19 231 lb (104.8 kg)  06/16/19 229 lb 6.4 oz (104.1 kg)     Exam: General: Pt appears well and is in NAD  Lungs: Clear with good BS bilat with no rales, rhonchi, or wheezes  Heart: RRR with normal S1 and S2 and no gallops; no murmurs; no rub  Extremities: No pretibial edema.   Neuro: MS is good with appropriate affect, pt is alert and Ox3   DM foot exam: 04/18/2019   The skin of the feet is intact without sores or ulcerations. The pedal pulses are 2+ on right and 2+ on left. The sensation is intact to a screening 5.07, 10 gram monofilament bilaterally     DATA REVIEWED:  Lab Results  Component Value Date   HGBA1C 7.3 (A) 02/11/2020   HGBA1C 6.9 (A) 07/11/2019   HGBA1C 7.0 (A) 04/18/2019   Lab Results  Component Value Date   LDLCALC 114 06/21/2015   CREATININE 0.63 12/13/2013    Lab Results  Component Value Date   CHOL 223 (H) 06/21/2015   HDL 59 06/21/2015   LDLCALC 114 06/21/2015   TRIG 249 (H) 06/21/2015   CHOLHDL 3.8 06/21/2015         ASSESSMENT /  PLAN / RECOMMENDATIONS:   1) Type 2 Diabetes Mellitus, Sub- Optimally controlled, Without complications - Most recent A1c of 7.3 %. Goal A1c < 7.0 %.     A1c up from 6.9 %   Patient is intolerant to Jardiance due to recurrent vaginal yeast infections, and intolerant to Metformin as well as Glipizide   Declined a referral to RD in the past   We discussed add on therapy with GLP-1 agonists, discussed benefits, pt opted to increase Glimepiride in the past   Pt would like onetouch Ultra strips refill ,which were sent today    MEDICATIONS: - Increase  Glimepiride 2 mg to 1.5 tablets daily   EDUCATION / INSTRUCTIONS:  BG monitoring instructions: Patient is instructed to check her blood sugars 1 times a day, fasting .  Call [facility] clinic if: BG persistently < 70      2) Diabetic complications:   Eye: Does not  have known diabetic retinopathy.   Neuro/ Feet: Does not have known diabetic peripheral neuropathy .   Renal: Patient does not have known baseline CKD. She   is not on an ACEI/ARB at present.     F/U in 4 months    Signed electronically by: Mack Guise, MD  [facility]  [facility] [facility] [address] [place], [place] [address] Phone: [phone] FAX: [phone]   CC: Leeroy Cha, [place] [place] [address] Phone: [phone]  Fax: [phone]  Return to Endocrinology clinic as below: Future Appointments  Date Time Provider [place]  06/15/2020 11:00 AM Deneise Lever, MD [facility] None

## 2020-02-11 ENCOUNTER — Ambulatory Visit: Payer: PPO | Admitting: Internal Medicine

## 2020-02-11 ENCOUNTER — Encounter: Payer: Self-pay | Admitting: Internal Medicine

## 2020-02-11 ENCOUNTER — Other Ambulatory Visit: Payer: Self-pay

## 2020-02-11 VITALS — BP 122/80 | HR 88 | Ht 64.0 in | Wt 241.1 lb

## 2020-02-11 DIAGNOSIS — E119 Type 2 diabetes mellitus without complications: Secondary | ICD-10-CM | POA: Diagnosis not present

## 2020-02-11 LAB — POCT GLYCOSYLATED HEMOGLOBIN (HGB A1C): Hemoglobin A1C: 7.3 % — AB (ref 4.0–5.6)

## 2020-02-11 MED ORDER — GLIMEPIRIDE 2 MG PO TABS: 3.0000 mg | ORAL_TABLET | Freq: Every day | ORAL | 3 refills | Status: DC

## 2020-02-11 MED ORDER — ONETOUCH ULTRA VI STRP: ORAL_STRIP | 12 refills | Status: DC

## 2020-02-11 NOTE — Patient Instructions (Addendum)
-   Increase Glimepiride 2 mg, to 1.5 tablets 20-30 minutes before breakfast       HOW TO TREAT LOW BLOOD SUGARS (Blood sugar LESS THAN 70 MG/DL)  Please follow the RULE OF 15 for the treatment of hypoglycemia treatment (when your (blood sugars are less than 70 mg/dL)    STEP 1: Take 15 grams of carbohydrates when your blood sugar is low, which includes:   3-4 GLUCOSE TABS  OR  3-4 OZ OF JUICE OR REGULAR SODA OR  ONE TUBE OF GLUCOSE GEL     STEP 2: RECHECK blood sugar in 15 MINUTES STEP 3: If your blood sugar is still low at the 15 minute recheck --> then, go back to STEP 1 and treat AGAIN with another 15 grams of carbohydrates.

## 2020-02-23 DIAGNOSIS — I872 Venous insufficiency (chronic) (peripheral): Secondary | ICD-10-CM | POA: Diagnosis not present

## 2020-02-23 DIAGNOSIS — Z7984 Long term (current) use of oral hypoglycemic drugs: Secondary | ICD-10-CM | POA: Diagnosis not present

## 2020-02-23 DIAGNOSIS — M85859 Other specified disorders of bone density and structure, unspecified thigh: Secondary | ICD-10-CM | POA: Diagnosis not present

## 2020-02-23 DIAGNOSIS — E1165 Type 2 diabetes mellitus with hyperglycemia: Secondary | ICD-10-CM | POA: Diagnosis not present

## 2020-02-23 DIAGNOSIS — Z1389 Encounter for screening for other disorder: Secondary | ICD-10-CM | POA: Diagnosis not present

## 2020-02-23 DIAGNOSIS — F419 Anxiety disorder, unspecified: Secondary | ICD-10-CM | POA: Diagnosis not present

## 2020-02-23 DIAGNOSIS — K219 Gastro-esophageal reflux disease without esophagitis: Secondary | ICD-10-CM | POA: Diagnosis not present

## 2020-02-23 DIAGNOSIS — Z0000 Encounter for general adult medical examination without abnormal findings: Secondary | ICD-10-CM | POA: Diagnosis not present

## 2020-02-23 DIAGNOSIS — E039 Hypothyroidism, unspecified: Secondary | ICD-10-CM | POA: Diagnosis not present

## 2020-03-08 ENCOUNTER — Telehealth: Payer: Self-pay | Admitting: Internal Medicine

## 2020-03-08 MED ORDER — DOXYCYCLINE HYCLATE 100 MG PO CAPS: ORAL_CAPSULE | ORAL | 0 refills | Status: DC

## 2020-03-08 NOTE — Telephone Encounter (Signed)
Called and spoke with pt. Pt states she began having complaints of sinus drainage, hoarseness of voice, and cough with green phlegm. States she went outside 3 days ago due to the weather being nice but then 2 days ago is when her symptoms began.  Pt states that she is not wheezing yet. She denies any complaints of fever as temp today was 98.7.  Pt states that her symptoms are mainly in her head but states she believes it is going into her throat/bronchial tubes and is trying to get this taken care of quickly before symptoms do go into her chest.  Pt is taking zyrtec daily and is also using the flonase nasal spray daily.  Pt is requesting to have abx sent to pharmacy.  Dr. Annamaria Boots, please advise.   Allergies  Allergen Reactions  . Hydrocod Polst-Cpm Polst Er Other (See Comments)    hyperactivity  . Pseudoephedrine Itching, Palpitations and Other (See Comments)    REACTION: tachycardia   . Sulfonamide Derivatives Anaphylaxis and Swelling  . Azithromycin Itching  . Buprenorphine Hcl Itching  . Codeine Itching  . Dilaudid [Hydromorphone Hcl] Itching  . Macrodantin [Nitrofurantoin] Itching    Hands and feet itching. "Trouble breathing" w/lump in throat and stuffy nose.  Marland Kitchen Morphine And Related Itching  . Shrimp [Shellfish Allergy] Itching  . Simvastatin Other (See Comments)    Fatigue, depression and dizziness at 20 mg  . Atorvastatin     Muscle cramps  And her blood sugar kept going up  . Cephalosporins Swelling    Said to cause facial swelling  . Erythromycin Base Itching and Swelling  . Hydrocodone-Acetaminophen Other (See Comments)    Redness of face, swelling of tolerates tylenol  . Penicillinase Itching  . Sulfamethoxazole Itching  . Clarithromycin Itching    REACTION: severe itching     Current Outpatient Medications:  .  amitriptyline (ELAVIL) 25 MG tablet, Take 25 mg by mouth at bedtime., Disp: , Rfl:  .  azelastine (ASTELIN) 0.1 % nasal spray, Place into both nostrils  as needed for rhinitis. Use in each nostril as directed, Disp: , Rfl:  .  calcium-vitamin D (OSCAL WITH D) 500-200 MG-UNIT tablet, Take 1 tablet by mouth daily with breakfast. , Disp: , Rfl:  .  cetirizine (ZYRTEC) 10 MG tablet, Take 10 mg by mouth daily., Disp: , Rfl:  .  fluconazole (DIFLUCAN) 150 MG tablet, Take 1 tablet (150 mg total) by mouth every 3 (three) days., Disp: 2 tablet, Rfl: 2 .  fluticasone (FLONASE) 50 MCG/ACT nasal spray, Place into both nostrils daily., Disp: , Rfl:  .  glimepiride (AMARYL) 2 MG tablet, Take 1.5 tablets (3 mg total) by mouth daily before breakfast., Disp: 135 tablet, Rfl: 3 .  glucose blood (ONETOUCH ULTRA) test strip, Use as instructed, Disp: 100 each, Rfl: 12 .  levothyroxine (SYNTHROID, LEVOTHROID) 100 MCG tablet, Take 100 mcg by mouth daily., Disp: , Rfl:  .  Lutein 20 MG CAPS, Take 1 capsule by mouth daily., Disp: , Rfl:  .  metoprolol succinate (TOPROL-XL) 50 MG 24 hr tablet, TAKE 1 TABLET BY MOUTH EVERY DAY WITH OR IMMEDIATELY FOLLOWING A MEAL, Disp: 90 tablet, Rfl: 2 .  omeprazole (PRILOSEC) 20 MG capsule, Take 1 capsule (20 mg total) by mouth 2 (two) times daily before a meal., Disp: 60 capsule, Rfl: 0 .  SHINGRIX injection, , Disp: , Rfl:

## 2020-03-08 NOTE — Telephone Encounter (Signed)
Called and spoke with pt letting her know that [facility] wants [place] to send Rx for doxy to the pharmacy for her to see if this would help and she verbalized understanding. Verified preferred pharmacy and sent Rx in for pt. Nothing further needed.

## 2020-03-08 NOTE — Telephone Encounter (Signed)
Please send doxycycline 100 mg, # 8, 20 today then one daily

## 2020-03-15 ENCOUNTER — Encounter: Payer: Self-pay | Admitting: Nurse Practitioner

## 2020-03-15 ENCOUNTER — Ambulatory Visit: Payer: PPO | Admitting: Nurse Practitioner

## 2020-03-15 ENCOUNTER — Other Ambulatory Visit: Payer: Self-pay

## 2020-03-15 VITALS — BP 132/80 | HR 76 | Ht 62.75 in | Wt 238.0 lb

## 2020-03-15 DIAGNOSIS — Z01419 Encounter for gynecological examination (general) (routine) without abnormal findings: Secondary | ICD-10-CM

## 2020-03-15 DIAGNOSIS — B3731 Acute candidiasis of vulva and vagina: Secondary | ICD-10-CM

## 2020-03-15 DIAGNOSIS — B373 Candidiasis of vulva and vagina: Secondary | ICD-10-CM

## 2020-03-15 DIAGNOSIS — M8580 Other specified disorders of bone density and structure, unspecified site: Secondary | ICD-10-CM

## 2020-03-15 MED ORDER — FLUCONAZOLE 150 MG PO TABS: 150.0000 mg | ORAL_TABLET | ORAL | 0 refills | Status: DC

## 2020-03-15 NOTE — Progress Notes (Signed)
   Jill Dean 04-23-1945 [id number]   History:  75 y.o. G1P1001 presents for breast and pelvic exam without GYN complaints. Postmenopausal - no HRT, no bleeding. Normal pap and mammogram history. Osteopenia, T2DM, hypothyroidism managed by PCP.  Gynecologic History No LMP recorded. Patient has had a hysterectomy.   Contraception: status post hysterectomy Last Pap: No longer screening per guidelines Last mammogram: 11/2019. Results were: normal Last colonoscopy: 2018. Results were: normal, 10-year recall Last Dexa: 2017. Results were: t-score -1.7, FRAX 14.9% / 2.9%  Past medical history, past surgical history, family history and social history were all reviewed and documented in the EPIC chart.  ROS:  A ROS was performed and pertinent positives and negatives are included.  Exam:  Vitals:   03/15/20 1114  BP: 132/80  Pulse: 76  Weight: 238 lb (108 kg)  Height: 5' 2.75" (1.594 m)   Body mass index is 42.5 kg/m.  General appearance:  Normal Thyroid:  Symmetrical, normal in size, without palpable masses or nodularity. Respiratory  Auscultation:  Clear without wheezing or rhonchi Cardiovascular  Auscultation:  Regular rate, without rubs, murmurs or gallops  Edema/varicosities:  Not grossly evident Abdominal  Soft,nontender, without masses, guarding or rebound.  Liver/spleen:  No organomegaly noted  Hernia:  None appreciated  Skin  Inspection:  Grossly normal   Breasts: Examined lying and sitting.   Right: Without masses, retractions, discharge or axillary adenopathy.   Left: Without masses, retractions, discharge or axillary adenopathy. Gentitourinary   Inguinal/mons:  Normal without inguinal adenopathy  External genitalia:  Normal  BUS/Urethra/Skene's glands:  Normal  Vagina:  Normal  Cervix:  Normal  Uterus:  Normal in size, shape and contour.  Midline and mobile  Adnexa/parametria:     Rt: Without masses or tenderness.   Lt: Without masses or tenderness.  Anus  and perineum: Normal  Digital rectal exam: Normal sphincter tone without palpated masses or tenderness  Assessment/Plan:  75 y.o. G1P1001 for breast and pelvic exam.   Well female exam with routine gynecological exam - Education provided on SBEs, importance of preventative screenings, current guidelines, high calcium diet, regular exercise, and multivitamin daily. Labs with PCP.   Osteopenia, unspecified location - DEXA 2017 t-score -1.7, FRAX 14.9% / 2.9%. Managed by PCP. Continue Vitamin D supplement and regular exercise.  Screening for cervical cancer - Normal Pap history.  No longer screening per guidelines.   Screening for breast cancer - Normal mammogram history.  Continue annual screenings.  Normal breast exam today.  Screening for colon cancer - 2018 colonoscopy. Will repeat at GI's recommended interval.   Return in 2 years for breast and pelvic exam.      Tamela Gammon [facility], 11:34 AM 03/15/2020

## 2020-03-15 NOTE — Patient Instructions (Signed)
Health Maintenance After Age 75 After age 75, you are at a higher risk for certain long-term diseases and infections as well as injuries from falls. Falls are a major cause of broken bones and head injuries in people who are older than age 75. Getting regular preventive care can help to keep you healthy and well. Preventive care includes getting regular testing and making lifestyle changes as recommended by your health care provider. Talk with your health care provider about:  Which screenings and tests you should have. A screening is a test that checks for a disease when you have no symptoms.  A diet and exercise plan that is right for you. What should I know about screenings and tests to prevent falls? Screening and testing are the best ways to find a health problem early. Early diagnosis and treatment give you the best chance of managing medical conditions that are common after age 75. Certain conditions and lifestyle choices may make you more likely to have a fall. Your health care provider may recommend:  Regular vision checks. Poor vision and conditions such as cataracts can make you more likely to have a fall. If you wear glasses, make sure to get your prescription updated if your vision changes.  Medicine review. Work with your health care provider to regularly review all of the medicines you are taking, including over-the-counter medicines. Ask your health care provider about any side effects that may make you more likely to have a fall. Tell your health care provider if any medicines that you take make you feel dizzy or sleepy.  Osteoporosis screening. Osteoporosis is a condition that causes the bones to get weaker. This can make the bones weak and cause them to break more easily.  Blood pressure screening. Blood pressure changes and medicines to control blood pressure can make you feel dizzy.  Strength and balance checks. Your health care provider may recommend certain tests to check your  strength and balance while standing, walking, or changing positions.  Foot health exam. Foot pain and numbness, as well as not wearing proper footwear, can make you more likely to have a fall.  Depression screening. You may be more likely to have a fall if you have a fear of falling, feel emotionally low, or feel unable to do activities that you used to do.  Alcohol use screening. Using too much alcohol can affect your balance and may make you more likely to have a fall. What actions can I take to lower my risk of falls? General instructions  Talk with your health care provider about your risks for falling. Tell your health care provider if: ? You fall. Be sure to tell your health care provider about all falls, even ones that seem minor. ? You feel dizzy, sleepy, or off-balance.  Take over-the-counter and prescription medicines only as told by your health care provider. These include any supplements.  Eat a healthy diet and maintain a healthy weight. A healthy diet includes low-fat dairy products, low-fat (lean) meats, and fiber from whole grains, beans, and lots of fruits and vegetables. Home safety  Remove any tripping hazards, such as rugs, cords, and clutter.  Install safety equipment such as grab bars in bathrooms and safety rails on stairs.  Keep rooms and walkways well-lit. Activity  Follow a regular exercise program to stay fit. This will help you maintain your balance. Ask your health care provider what types of exercise are appropriate for you.  If you need a cane or walker,   use it as recommended by your health care provider.  Wear supportive shoes that have nonskid soles.   Lifestyle  Do not drink alcohol if your health care provider tells you not to drink.  If you drink alcohol, limit how much you have: ? 0-1 drink a day for women. ? 0-2 drinks a day for men.  Be aware of how much alcohol is in your drink. In the [place] one drink equals one typical bottle of beer (12  oz), one-half glass of wine (5 oz), or one shot of hard liquor (1 oz).  Do not use any products that contain nicotine or tobacco, such as cigarettes and e-cigarettes. If you need help quitting, ask your health care provider. Summary  Having a healthy lifestyle and getting preventive care can help to protect your health and wellness after age 75.  Screening and testing are the best way to find a health problem early and help you avoid having a fall. Early diagnosis and treatment give you the best chance for managing medical conditions that are more common for people who are older than age 75.  Falls are a major cause of broken bones and head injuries in people who are older than age 75. Take precautions to prevent a fall at home.  Work with your health care provider to learn what changes you can make to improve your health and wellness and to prevent falls. This information is not intended to replace advice given to you by your health care provider. Make sure you discuss any questions you have with your health care provider. Document Revised: 04/11/2018 Document Reviewed: 11/01/2016 Elsevier Patient Education  2021 Elsevier Inc.  

## 2020-04-15 DIAGNOSIS — E785 Hyperlipidemia, unspecified: Secondary | ICD-10-CM | POA: Diagnosis not present

## 2020-04-15 DIAGNOSIS — I739 Peripheral vascular disease, unspecified: Secondary | ICD-10-CM | POA: Diagnosis not present

## 2020-04-15 DIAGNOSIS — E1165 Type 2 diabetes mellitus with hyperglycemia: Secondary | ICD-10-CM | POA: Diagnosis not present

## 2020-04-16 ENCOUNTER — Other Ambulatory Visit: Payer: Self-pay | Admitting: Internal Medicine

## 2020-04-16 DIAGNOSIS — I739 Peripheral vascular disease, unspecified: Secondary | ICD-10-CM

## 2020-05-04 ENCOUNTER — Ambulatory Visit
Admission: RE | Admit: 2020-05-04 | Discharge: 2020-05-04 | Disposition: A | Discharge status: Home / Self Care | Payer: PPO | Source: Ambulatory Visit | Attending: Internal Medicine | Admitting: Internal Medicine

## 2020-05-04 DIAGNOSIS — R531 Weakness: Secondary | ICD-10-CM | POA: Diagnosis not present

## 2020-05-04 DIAGNOSIS — I739 Peripheral vascular disease, unspecified: Secondary | ICD-10-CM

## 2020-05-26 ENCOUNTER — Encounter: Payer: Self-pay | Admitting: Nurse Practitioner

## 2020-05-26 ENCOUNTER — Ambulatory Visit: Payer: PPO | Admitting: Nurse Practitioner

## 2020-05-26 ENCOUNTER — Other Ambulatory Visit: Payer: Self-pay

## 2020-05-26 VITALS — BP 130/84

## 2020-05-26 DIAGNOSIS — R102 Pelvic and perineal pain: Secondary | ICD-10-CM | POA: Diagnosis not present

## 2020-05-26 DIAGNOSIS — N952 Postmenopausal atrophic vaginitis: Secondary | ICD-10-CM | POA: Diagnosis not present

## 2020-05-26 DIAGNOSIS — N898 Other specified noninflammatory disorders of vagina: Secondary | ICD-10-CM

## 2020-05-26 LAB — URINALYSIS, COMPLETE W/RFL CULTURE
Bacteria, UA: NONE SEEN /HPF
Bilirubin Urine: NEGATIVE
Glucose, UA: NEGATIVE
Hgb urine dipstick: NEGATIVE
Hyaline Cast: NONE SEEN /LPF
Ketones, ur: NEGATIVE
Leukocyte Esterase: NEGATIVE
Nitrites, Initial: NEGATIVE
Protein, ur: NEGATIVE
RBC / HPF: NONE SEEN /HPF (ref 0–2)
Specific Gravity, Urine: 1.01 (ref 1.001–1.035)
WBC, UA: NONE SEEN /HPF (ref 0–5)
pH: 6.5 (ref 5.0–8.0)

## 2020-05-26 LAB — WET PREP FOR TRICH, YEAST, CLUE

## 2020-05-26 LAB — NO CULTURE INDICATED

## 2020-05-26 NOTE — Progress Notes (Signed)
   Acute Office Visit  Subjective:    Patient ID: Jill Dean, female    DOB: September 01, 1945, 75 y.o.   MRN: [id number]   HPI 75 y.o. presents today for pelvic pressure, burning at start of urination and vaginal itching. Symptoms started about a week ago. She is going to [employer] tomorrow and is worried she has an infection.    Review of Systems  Constitutional: Negative.   Genitourinary: Positive for dysuria, frequency and urgency. Negative for flank pain, hematuria and vaginal discharge.       Vaginal itching       Objective:    Physical Exam Constitutional:      Appearance: Normal appearance.  Abdominal:     Tenderness: There is no right CVA tenderness or left CVA tenderness.  Genitourinary:    General: Normal vulva.     Vagina: Normal. No vaginal discharge or erythema.     Comments: Atrophic changes    BP 130/84  Wt Readings from Last 3 Encounters:  03/15/20 238 lb (108 kg)  02/11/20 241 lb 2 oz (109.4 kg)  07/11/19 231 lb (104.8 kg)   UA negative Wet prep negative     Assessment & Plan:   Problem List Items Addressed This Visit   None   Visit Diagnoses    Postmenopausal atrophic vaginitis    -  Primary   Vagina itching       Relevant Orders   WET PREP FOR TRICH, YEAST, CLUE   Pelvic pressure in female       Relevant Orders   Urinalysis,Complete w/RFL Culture     Plan: Reassurance provided on normal UA and wet prep. Symptoms likely from atrophic vaginitis. She uses Replens occasionally. Recommend using Replens twice weekly and applying coconut oil externally daily for maintenance lubrication. If symptoms worsen or do not improve she will return to office. She is agreeable to plan.      Tamela Gammon DNP, 12:24 PM 05/26/2020

## 2020-05-26 NOTE — Patient Instructions (Signed)
Atrophic Vaginitis  Atrophic vaginitis is a condition in which the tissues that line the vagina become dry and thin. This condition is most common in women who have stopped having regular menstrual periods (are in menopause). This usually starts when a woman is 74 to 75 years old. That is the time when a woman's estrogen levels begin to decrease. Estrogen is a female hormone. It helps to keep the tissues of the vagina moist. It stimulates the vagina to produce a clear fluid that lubricates the vagina for sex. This fluid also protects the vagina from infection. Lack of estrogen can cause the lining of the vagina to get thinner and dryer. The vagina may also shrink in size. It may become less elastic. Atrophic vaginitis tends to get worse over time as a woman's estrogen level drops. What are the causes? This condition is caused by the normal drop in estrogen that happens around the time of menopause. What increases the risk? Certain conditions or situations may lower a woman's estrogen level, leading to a higher risk for atrophic vaginitis. You are more likely to develop this condition if:  You are taking medicines that block estrogen.  You have had your ovaries removed.  You are being treated for cancer with radiation or medicines (chemotherapy).  You have given birth or are breastfeeding.  You are older than age 68.  You smoke. What are the signs or symptoms? Symptoms of this condition include:  Pain, soreness, a feeling of pressure, or bleeding during sex (dyspareunia).  Vaginal burning, irritation, or itching.  Pain or bleeding when a speculum is used in a vaginal exam.  Having burning pain while urinating.  Vaginal discharge. In some cases, there are no symptoms. How is this diagnosed? This condition is diagnosed based on your medical history and a physical exam. This will include a pelvic exam that checks the vaginal tissues. Though rare, you may also have other tests,  including:  A urine test.  A test that checks the acid balance in your vagina (acid balance test). How is this treated? Treatment for this condition depends on how severe your symptoms are. Treatment may include:  Using an over-the-counter vaginal lubricant before sex.  Using a long-acting vaginal moisturizer.  Using low-dose estrogen for moderate to severe symptoms that do not respond to other treatments. Options include creams, tablets, and inserts (vaginal rings). Before you use a vaginal estrogen, tell your health care provider if you have a history of: ? Breast cancer. ? Endometrial cancer. ? Blood clots. If you are not sexually active and your symptoms are very mild, you may not need treatment. Follow these instructions at home: Medicines  Take over-the-counter and prescription medicines only as told by your health care provider.  Do not use herbal or alternative medicines unless your health care provider says that you can.  Use over-the-counter creams, lubricants, or moisturizers for dryness only as told by your health care provider. General instructions  If your atrophic vaginitis is caused by menopause, discuss all of your menopause symptoms and treatment options with your health care provider.  Do not douche.  Do not use products that can make your vagina dry. These include: ? Scented feminine sprays. ? Scented tampons. ? Scented soaps.  Vaginal sex can help to improve blood flow and elasticity of vaginal tissue. If you choose to have sex and it hurts, try using a water-soluble lubricant or moisturizer right before having sex. Contact a health care provider if:  Your discharge looks  different than normal.  Your vagina has an unusual smell.  You have new symptoms.  Your symptoms do not improve with treatment.  Your symptoms get worse. Summary  Atrophic vaginitis is a condition in which the tissues that line the vagina become dry and thin. It is most common  in women who have stopped having regular menstrual periods (are in menopause).  Treatment options include using vaginal lubricants and low-dose vaginal estrogen.  Contact a health care provider if your vagina has an unusual smell, or if your symptoms get worse or do not improve after treatment. This information is not intended to replace advice given to you by your health care provider. Make sure you discuss any questions you have with your health care provider. Document Revised: 06/19/2019 Document Reviewed: 06/19/2019 Elsevier Patient Education  [place].

## 2020-06-02 ENCOUNTER — Other Ambulatory Visit: Payer: Self-pay | Admitting: Cardiology

## 2020-06-14 NOTE — Progress Notes (Signed)
    Patient ID: Jill Dean, female    DOB: 1945-01-30, 75 y.o.   MRN: [id number]  HPI female never smoker followed for allergic rhinitis/conjunctivitis, complicated by history Raynaud's syndrome, GERD, urticaria, DM, PSVT  ---------------------------------------------------------------------------------------------------    06/16/19- 75 year old female never smoker followed for allergic rhinitis/conjunctivitis, complicated by history Raynaud's syndrome, GERD, urticaria, DM2, Hypothyroid, Dyslipidemia, PSVT Zyrtec, Flonase, Astelin Had Covid infection Dec, 2021- sinusitis and few days of fever, tested positive, managed at home. Acute bronchitis in the Spring- caught from grandchild, resolved. Mild pollen rhinitis this year- credits mask, but still has flonase and zyrtec when needed.   06/15/20- 75 year old female never smoker followed for Allergic Rhinitis/conjunctivitis, complicated by history Raynaud's syndrome, GERD, Urticaria, DM2, Hypothyroid, Dyslipidemia, HTN, PSVT, Covid infection Dec 2021, Hyperlipidemia, Obesity,  -Zyrtec, Flonase, Astelin Covid vax 1 Phizer Patient has some redness and swelling to the right side of her face, eye and and neck. Feels like breathing is good.  Concerned about variable rash on face, worse this morning after sleeping on right side.  Denies change cosmetics or effect of mask or sunblock.  Dermatologist gave topical steroid-some help. Also- nasal congesstion x 2 weeks, green/ yellow nasal discharge, scant blood.   Review of Systems-See HPI + = positive Constitutional:   No-   weight loss, night sweats, fevers, chills, fatigue, lassitude. HEENT:    headaches, difficulty swallowing, tooth/dental problems, +sore throat,       sneezing, itching, ear ache, +nasal congestion, +post nasal drip,  CV:  No-   chest pain, orthopnea, PND, swelling in lower extremities, anasarca,  dizziness, palpitations Resp: No-   shortness of breath with exertion or at rest.             +-productive cough,  No non-productive cough,  No-  coughing up of blood.              -change in color of mucus.  No- wheezing.    Objective:   Physical Exam General- Alert, Oriented, Affect-appropriate, Distress- none acute  +Obese Skin- +faint erythema butterfly distribution Lymphadenopathy- none Head- atraumatic            Eyes- Gross vision intact, PERRLA, conjunctivae clear secretions, +periorbital edema            Ears- Hearing,             Nose-  +turbinate edema , No-Septal dev,  polyps, erosion, perforation,             Throat- Mallampati III-IV , mucosa clear-not red , drainage- none, tonsils- atrophic.  Neck- flexible , trachea midline, no stridor , thyroid nl, carotid no bruit Chest -            Lung- clear to P&A but diminished, wheeze- none, cough- none , dullness-none, rub- none           Cardiac- 1 extra beat, otw RRR           Chest wall-  Abd-  Br/ Gen/ Rectal- Not done, not indicated Extrem- cyanosis- none, clubbing, none, atrophy- none, strength- nl, + superficial varices Neuro- grossly intact to observation

## 2020-06-15 ENCOUNTER — Ambulatory Visit (INDEPENDENT_AMBULATORY_CARE_PROVIDER_SITE_OTHER): Payer: PPO | Admitting: Internal Medicine

## 2020-06-15 ENCOUNTER — Other Ambulatory Visit: Payer: Self-pay

## 2020-06-15 ENCOUNTER — Encounter: Payer: Self-pay | Admitting: Internal Medicine

## 2020-06-15 VITALS — BP 120/74 | HR 70 | Temp 97.5°F | Ht 63.5 in | Wt 240.0 lb

## 2020-06-15 DIAGNOSIS — R21 Rash and other nonspecific skin eruption: Secondary | ICD-10-CM

## 2020-06-15 DIAGNOSIS — J0181 Other acute recurrent sinusitis: Secondary | ICD-10-CM

## 2020-06-15 LAB — CBC WITH DIFFERENTIAL/PLATELET
Basophils Absolute: 0 10*3/uL (ref 0.0–0.1)
Basophils Relative: 0.5 % (ref 0.0–3.0)
Eosinophils Absolute: 0.2 10*3/uL (ref 0.0–0.7)
Eosinophils Relative: 3.3 % (ref 0.0–5.0)
HCT: 38.8 % (ref 36.0–46.0)
Hemoglobin: 12.9 g/dL (ref 12.0–15.0)
Lymphocytes Relative: 38.6 % (ref 12.0–46.0)
Lymphs Abs: 2.4 10*3/uL (ref 0.7–4.0)
MCHC: 33.3 g/dL (ref 30.0–36.0)
MCV: 85.3 fl (ref 78.0–100.0)
Monocytes Absolute: 0.4 10*3/uL (ref 0.1–1.0)
Monocytes Relative: 7.1 % (ref 3.0–12.0)
Neutro Abs: 3.2 10*3/uL (ref 1.4–7.7)
Neutrophils Relative %: 50.5 % (ref 43.0–77.0)
Platelets: 236 10*3/uL (ref 150.0–400.0)
RBC: 4.55 Mil/uL (ref 3.87–5.11)
RDW: 13.9 % (ref 11.5–15.5)
WBC: 6.3 10*3/uL (ref 4.0–10.5)

## 2020-06-15 LAB — SEDIMENTATION RATE: Sed Rate: 9 mm/hr (ref 0–30)

## 2020-06-15 MED ORDER — FLUCONAZOLE 150 MG PO TABS
150.0000 mg | ORAL_TABLET | Freq: Every day | ORAL | 0 refills | Status: DC
Start: 2020-06-15 — End: 2020-08-20

## 2020-06-15 MED ORDER — DOXYCYCLINE HYCLATE 100 MG PO CAPS: ORAL_CAPSULE | ORAL | 0 refills | Status: DC

## 2020-06-15 NOTE — Patient Instructions (Addendum)
Script sent for doxycycline.and Diflucan   Be careful to avoid sun exposure while taking this.  Try otc antihistamine Clortrimeton   your pharmacist can help you find it.   May cause more drowsiness, but it might help the itching  Order- lab- CBC w diff, ANA, Sed Rate, Angioedema panel   dx rash

## 2020-06-17 ENCOUNTER — Other Ambulatory Visit: Payer: Self-pay

## 2020-06-17 ENCOUNTER — Encounter: Payer: Self-pay | Admitting: Internal Medicine

## 2020-06-17 ENCOUNTER — Ambulatory Visit (INDEPENDENT_AMBULATORY_CARE_PROVIDER_SITE_OTHER): Payer: PPO | Admitting: Internal Medicine

## 2020-06-17 VITALS — BP 128/76 | HR 66 | Ht 62.0 in | Wt 236.0 lb

## 2020-06-17 DIAGNOSIS — E119 Type 2 diabetes mellitus without complications: Secondary | ICD-10-CM

## 2020-06-17 LAB — POCT GLYCOSYLATED HEMOGLOBIN (HGB A1C): Hemoglobin A1C: 7.6 % — AB (ref 4.0–5.6)

## 2020-06-17 MED ORDER — GLIMEPIRIDE 4 MG PO TABS: 4.0000 mg | ORAL_TABLET | Freq: Every day | ORAL | 3 refills | Status: DC

## 2020-06-17 NOTE — Progress Notes (Signed)
Name: Jill Dean  Age/ Sex: 75 y.o., female   MRN/ DOB: [id number], [birth year]     PCP: Leeroy Cha, MD   Reason for Endocrinology Evaluation: Type 2 Diabetes Mellitus  Initial Endocrine Consultative Visit: 04/18/2019    PATIENT IDENTIFIER: Ms. LANE KJOS is a 75 y.o. female with a past medical history of T2DM, Hypothyroidism and Dyslipidemia. The patient has followed with Endocrinology clinic since 04/18/2019 for consultative assistance with management of her diabetes.  DIABETIC HISTORY:  Ms. Olveda was diagnosed with DM in 2013. Metformin - GI issues, Jardiance - recurrent yeast infection, Glipizide- Hives . Her hemoglobin A1c has ranged from 7.0% in 2015, peaking at 7.8% in 2020.  In her initial visit to our clinic she had an A1c of 7.0%, we stopped Jardiance due to recurrent yeast infections. She is allergic due to Glipizide  SUBJECTIVE:   During the last visit (02/11/2020): A1c 7.3 % , We increased  Glimepiride  Today (06/17/2020): Ms. Dawn is here for a follow up on diabetes management. She developed a reaction to Glipizide and switched back to Glimepiride.  She checks her blood sugars 2 times daily. The patient has not had hypoglycemic episodes since the last clinic visit.  Denies nausea or vomiting  Has recent fall while at the [place]:  Glimepiride 2 mg , 1.5 tabs daily      Statin: Intolerant ACE-I/ARB: no    METER DOWNLOAD SUMMARY: Date range evaluated: 6/2-6/16/2022 Average Number Tests/Day = 0.9 Overall Mean FS Glucose = 191  BG Ranges: Low = 164 High = 213   Hypoglycemic Events/30 Days: BG < 50 = 0 Episodes of symptomatic severe hypoglycemia = 0    DIABETIC COMPLICATIONS: Microvascular complications:    Denies: CKD, retinopathy , neuropathy  Last eye exam: Completed 12/2019   Macrovascular complications:    Denies: CAD, PVD, CVA     HISTORY:  Past Medical History:  Past Medical History:  Diagnosis Date    Chest pain    a. s/p normal ett nuclear stress test on 73/22/02   Complication of anesthesia    COVID-19 12/2019   Diabetes mellitus without complication (HCC)    Dysrhythmia    fast heart rate   GERD (gastroesophageal reflux disease)    HLD (hyperlipidemia)    Hypothyroidism    Obesity    Osteoarthrosis, unspecified whether generalized or localized, unspecified site    Other chronic allergic conjunctivitis    PONV (postoperative nausea and vomiting)    Raynaud's syndrome    Urticaria, unspecified    Past Surgical History:  Past Surgical History:  Procedure Laterality Date   CHOLECYSTECTOMY     CYST EXCISION Left 11/10/2014   Procedure: EXCISION CYST LEFT INDEX FINGER AND CYST LEFT SMALL FINGER ;  Surgeon: [name], MD;  Location: [facility];  Service: Orthopedics;  Laterality: Left;   ESOPHAGOGASTRODUODENOSCOPY N/A 01/05/2014   Procedure: ESOPHAGOGASTRODUODENOSCOPY (EGD);  Surgeon: [name], MD;  Location: [facility];  Service: Endoscopy;  Laterality: N/A;   knot right breast     LEFT HEART CATHETERIZATION WITH CORONARY ANGIOGRAM N/A 10/22/2013   Procedure: LEFT HEART CATHETERIZATION WITH CORONARY ANGIOGRAM;  Surgeon: [name], MD;  Location: [facility];  Service: Cardiovascular;  Laterality: N/A;   OOPHORECTOMY     LSO 84-RSO 04   thyroid nodule     TONSILLECTOMY     VAGINAL HYSTERECTOMY  2004   LAVH RSO endometriosis  VESICOVAGINAL FISTULA CLOSURE W/ TAH     Social History:  reports that she has never smoked. She has never used smokeless tobacco. She reports current alcohol use. She reports that she does not use drugs. Family History:  Family History  Problem Relation Age of Onset   Lung cancer Father    Heart disease Father    Heart failure Mother    Dementia Mother    Heart disease Maternal Uncle    Lung cancer Paternal Uncle    COPD Paternal Uncle      HOME MEDICATIONS: Allergies as of 06/17/2020       Reactions    Hydrocod Polst-cpm Polst Er Other (See Comments)   hyperactivity   Pseudoephedrine Itching, Palpitations, Other (See Comments)   REACTION: tachycardia   Sulfonamide Derivatives Anaphylaxis, Swelling   Azithromycin Itching   Buprenorphine Hcl Itching   Codeine Itching   Dilaudid [hydromorphone Hcl] Itching   Macrodantin [nitrofurantoin] Itching   Hands and feet itching. "Trouble breathing" w/lump in throat and stuffy nose.   Morphine And Related Itching   Shrimp [shellfish Allergy] Itching   Simvastatin Other (See Comments)   Fatigue, depression and dizziness at 20 mg   Atorvastatin    Muscle cramps  And her blood sugar kept going up   Cephalosporins Swelling   Said to cause facial swelling   Covid-19 (adenovirus) Vaccine    Other reaction(s): rash, itching, skin tight   Empagliflozin    Other reaction(s): yeast infection   Erythromycin Base Itching, Swelling   Hydrocod Polst-cpm Polst Er    Other reaction(s): flushing and facial edema   Hydrocodone-acetaminophen Other (See Comments)   Redness of face, swelling of tolerates tylenol   Metformin Hcl    Other reaction(s): epigastric pain   Metronidazole    Other reaction(s): tonsil swelling   Other    Other reaction(s): swelling eyes   Penicillinase Itching   Sulfamethoxazole Itching   Clarithromycin Itching   REACTION: severe itching        Medication List        Accurate as of June 17, 2020 11:10 AM. If you have any questions, ask your nurse or doctor.          amitriptyline 25 MG tablet Commonly known as: ELAVIL Take 25 mg by mouth at bedtime.   azelastine 0.1 % nasal spray Commonly known as: ASTELIN Place into both nostrils as needed for rhinitis. Use in each nostril as directed   calcium-vitamin D 500-200 MG-UNIT tablet Commonly known as: OSCAL WITH D Take 1 tablet by mouth daily with breakfast.   cetirizine 10 MG tablet Commonly known as: ZYRTEC Take 10 mg by mouth daily.   doxycycline 100 MG  capsule Commonly known as: VIBRAMYCIN Take 2 today and then 1 daily until finished   fluconazole 150 MG tablet Commonly known as: DIFLUCAN Take 1 tablet (150 mg total) by mouth every 3 (three) days.   fluconazole 150 MG tablet Commonly known as: Diflucan Take 1 tablet (150 mg total) by mouth daily.   fluticasone 50 MCG/ACT nasal spray Commonly known as: FLONASE Place into both nostrils daily.   glimepiride 2 MG tablet Commonly known as: AMARYL Take 1.5 tablets (3 mg total) by mouth daily before breakfast.   glipiZIDE 5 MG tablet Commonly known as: GLUCOTROL Take by mouth daily before breakfast.   levothyroxine 100 MCG tablet Commonly known as: SYNTHROID Take 100 mcg by mouth daily.   Lutein 20 MG Caps Take 1 capsule by mouth  daily.   Magnesium 400 MG Caps Take 1 tablet by mouth daily.   metoprolol succinate 50 MG 24 hr tablet Commonly known as: TOPROL-XL TAKE 1 TABLET BY MOUTH EVERY DAY WITH OR IMMEDIATELY FOLLOWING A MEAL   omeprazole 20 MG capsule Commonly known as: PRILOSEC Take 1 capsule (20 mg total) by mouth 2 (two) times daily before a meal.   OneTouch Ultra test strip Generic drug: glucose blood Use as instructed         OBJECTIVE:   Vital Signs: BP 128/76   Pulse 66   Ht 5\' 2"  (1.575 m)   Wt 236 lb (107 kg)   SpO2 99%   BMI 43.16 kg/m   Wt Readings from Last 3 Encounters:  06/17/20 236 lb (107 kg)  06/15/20 240 lb (108.9 kg)  03/15/20 238 lb (108 kg)     Exam: General: Pt appears well and is in NAD  Lungs: Clear with good BS bilat   Heart: RRR   Extremities: No pretibial edema.   Neuro: MS is good with appropriate affect, pt is alert and Ox3   DM foot exam: 06/17/2020   The skin of the feet without sores or ulcerations. Plantar callous formation noted bilaterally  The pedal pulses are 2+ on right and 2+ on left. The sensation is intact to a screening 5.07, 10 gram monofilament bilaterally     DATA REVIEWED:  Lab Results   Component Value Date   HGBA1C 7.3 (A) 02/11/2020   HGBA1C 6.9 (A) 07/11/2019   HGBA1C 7.0 (A) 04/18/2019   Lab Results  Component Value Date   LDLCALC 114 06/21/2015   CREATININE 0.63 12/13/2013    Lab Results  Component Value Date   CHOL 223 (H) 06/21/2015   HDL 59 06/21/2015   LDLCALC 114 06/21/2015   TRIG 249 (H) 06/21/2015   CHOLHDL 3.8 06/21/2015         ASSESSMENT / PLAN / RECOMMENDATIONS:   1) Type 2 Diabetes Mellitus, Sub- Optimally controlled, Without complications - Most recent A1c of 7.6 %. Goal A1c < 7.0 %.    Her A1c continues to gradually trending up , she admits to dietary indiscretions as she has been going to the beach every other weekend  Patient is intolerant to Jardiance due to recurrent vaginal yeast infections, and intolerant to Metformin as well as Glipizide - rash   We had discussed add-on therapy with GLP-1 agonists in the past but she is skeptical due to history of multi-drug sensitivity  Will increase Glimepiride as below    MEDICATIONS: - Increase  Glimepiride to 4 mg daily   EDUCATION / INSTRUCTIONS: BG monitoring instructions: Patient is instructed to check her blood sugars 1 times a day, fasting . Call [facility] if: BG persistently < 70      2) Diabetic complications:  Eye: Does not  have known diabetic retinopathy.  Neuro/ Feet: Does not have known diabetic peripheral neuropathy .  Renal: Patient does not have known baseline CKD. She   is not on an ACEI/ARB at present.     F/U in 4 months    Signed electronically by: Mack Guise, MD  [facility] Endocrinology  [facility] [address] [place],  [address] Phone: [phone] FAX: [phone]   CC: Leeroy Cha, [place] [place] [address] Phone: [phone]  Fax: [phone]  Return to Endocrinology clinic as below: Future Appointments  Date Time Provider Department  Center  09/23/2020  8:40 AM Jerline Pain, MD [facility]  06/15/2021 11:30 AM Deneise Lever, MD [facility] None

## 2020-06-17 NOTE — Patient Instructions (Addendum)
-   Increase Glimepiride to 4 mg, before breakfast      HOW TO TREAT LOW BLOOD SUGARS (Blood sugar LESS THAN 70 MG/DL) Please follow the RULE OF 15 for the treatment of hypoglycemia treatment (when your (blood sugars are less than 70 mg/dL)   STEP 1: Take 15 grams of carbohydrates when your blood sugar is low, which includes:  3-4 GLUCOSE TABS  OR 3-4 OZ OF JUICE OR REGULAR SODA OR ONE TUBE OF GLUCOSE GEL    STEP 2: RECHECK blood sugar in 15 MINUTES STEP 3: If your blood sugar is still low at the 15 minute recheck --> then, go back to STEP 1 and treat AGAIN with another 15 grams of carbohydrates.

## 2020-06-22 LAB — C1 ESTERASE INHIBITOR, FUNCTIONAL: C1 Esterase Inhibitor Funct: >100 %

## 2020-06-22 LAB — C4 COMPLEMENT: C4 Complement: 30 mg/dL (ref 15–57)

## 2020-06-22 LAB — ANA: Anti Nuclear Antibody (ANA): NEGATIVE

## 2020-06-22 LAB — C1 ESTERASE INHIBITOR: C1INH SerPl-mCnc: 33 mg/dL (ref 21–39)

## 2020-06-22 LAB — COMPLEMENT COMPONENT C1Q: Complement C1Q: 10.7 mg/dL — ABNORMAL HIGH (ref 5.0–8.6)

## 2020-06-24 ENCOUNTER — Encounter: Payer: Self-pay | Admitting: *Deleted

## 2020-06-24 NOTE — Progress Notes (Signed)
LMTCB

## 2020-06-30 ENCOUNTER — Telehealth: Payer: Self-pay | Admitting: Internal Medicine

## 2020-06-30 NOTE — Telephone Encounter (Signed)
Called and spoke to pt. Pt is requesting the results of her labs. Informed her of the results per CY. Pt verbalized understanding and denied any further questions or concerns at this time.

## 2020-07-01 DIAGNOSIS — L249 Irritant contact dermatitis, unspecified cause: Secondary | ICD-10-CM | POA: Diagnosis not present

## 2020-07-29 DIAGNOSIS — L821 Other seborrheic keratosis: Secondary | ICD-10-CM | POA: Diagnosis not present

## 2020-07-29 DIAGNOSIS — D1801 Hemangioma of skin and subcutaneous tissue: Secondary | ICD-10-CM | POA: Diagnosis not present

## 2020-07-29 DIAGNOSIS — L819 Disorder of pigmentation, unspecified: Secondary | ICD-10-CM | POA: Diagnosis not present

## 2020-07-29 DIAGNOSIS — L814 Other melanin hyperpigmentation: Secondary | ICD-10-CM | POA: Diagnosis not present

## 2020-08-09 DIAGNOSIS — Z0182 Encounter for allergy testing: Secondary | ICD-10-CM | POA: Diagnosis not present

## 2020-08-09 DIAGNOSIS — L233 Allergic contact dermatitis due to drugs in contact with skin: Secondary | ICD-10-CM | POA: Diagnosis not present

## 2020-08-12 DIAGNOSIS — L235 Allergic contact dermatitis due to other chemical products: Secondary | ICD-10-CM | POA: Diagnosis not present

## 2020-08-20 ENCOUNTER — Emergency Department (HOSPITAL_COMMUNITY)
Admission: EM | Admit: 2020-08-20 | Discharge: 2020-08-20 | Disposition: A | Discharge status: Home / Self Care | Payer: PPO | Attending: Emergency Medicine | Admitting: Emergency Medicine

## 2020-08-20 DIAGNOSIS — T782XXA Anaphylactic shock, unspecified, initial encounter: Secondary | ICD-10-CM | POA: Diagnosis not present

## 2020-08-20 DIAGNOSIS — E119 Type 2 diabetes mellitus without complications: Secondary | ICD-10-CM | POA: Insufficient documentation

## 2020-08-20 DIAGNOSIS — T7840XA Allergy, unspecified, initial encounter: Secondary | ICD-10-CM | POA: Diagnosis not present

## 2020-08-20 DIAGNOSIS — R609 Edema, unspecified: Secondary | ICD-10-CM | POA: Diagnosis not present

## 2020-08-20 DIAGNOSIS — Z7984 Long term (current) use of oral hypoglycemic drugs: Secondary | ICD-10-CM | POA: Diagnosis not present

## 2020-08-20 DIAGNOSIS — Z8616 Personal history of COVID-19: Secondary | ICD-10-CM | POA: Diagnosis not present

## 2020-08-20 DIAGNOSIS — R531 Weakness: Secondary | ICD-10-CM | POA: Diagnosis not present

## 2020-08-20 DIAGNOSIS — L509 Urticaria, unspecified: Secondary | ICD-10-CM | POA: Diagnosis not present

## 2020-08-20 DIAGNOSIS — E039 Hypothyroidism, unspecified: Secondary | ICD-10-CM | POA: Insufficient documentation

## 2020-08-20 DIAGNOSIS — L299 Pruritus, unspecified: Secondary | ICD-10-CM | POA: Diagnosis present

## 2020-08-20 DIAGNOSIS — I10 Essential (primary) hypertension: Secondary | ICD-10-CM | POA: Diagnosis not present

## 2020-08-20 DIAGNOSIS — Z79899 Other long term (current) drug therapy: Secondary | ICD-10-CM | POA: Diagnosis not present

## 2020-08-20 LAB — CBC WITH DIFFERENTIAL/PLATELET
Abs Immature Granulocytes: 0.02 10*3/uL (ref 0.00–0.07)
Basophils Absolute: 0.1 10*3/uL (ref 0.0–0.1)
Basophils Relative: 1 %
Eosinophils Absolute: 0.2 10*3/uL (ref 0.0–0.5)
Eosinophils Relative: 3 %
HCT: 42.1 % (ref 36.0–46.0)
Hemoglobin: 13.8 g/dL (ref 12.0–15.0)
Immature Granulocytes: 0 %
Lymphocytes Relative: 34 %
Lymphs Abs: 2.9 10*3/uL (ref 0.7–4.0)
MCH: 28.7 pg (ref 26.0–34.0)
MCHC: 32.8 g/dL (ref 30.0–36.0)
MCV: 87.5 fL (ref 80.0–100.0)
Monocytes Absolute: 0.6 10*3/uL (ref 0.1–1.0)
Monocytes Relative: 7 %
Neutro Abs: 4.7 10*3/uL (ref 1.7–7.7)
Neutrophils Relative %: 55 %
Platelets: 252 10*3/uL (ref 150–400)
RBC: 4.81 MIL/uL (ref 3.87–5.11)
RDW: 13.2 % (ref 11.5–15.5)
WBC: 8.5 10*3/uL (ref 4.0–10.5)
nRBC: 0 % (ref 0.0–0.2)

## 2020-08-20 LAB — BASIC METABOLIC PANEL
Anion gap: 8 (ref 5–15)
BUN: 11 mg/dL (ref 8–23)
CO2: 30 mmol/L (ref 22–32)
Calcium: 9.2 mg/dL (ref 8.9–10.3)
Chloride: 101 mmol/L (ref 98–111)
Creatinine, Ser: 0.85 mg/dL (ref 0.44–1.00)
GFR, Estimated: >60 mL/min (ref >60)
Glucose, Bld: 121 mg/dL — ABNORMAL HIGH (ref 70–99)
Potassium: 3.8 mmol/L (ref 3.5–5.1)
Sodium: 139 mmol/L (ref 135–145)

## 2020-08-20 MED ORDER — FAMOTIDINE 20 MG PO TABS
20.0000 mg | ORAL_TABLET | Freq: Once | ORAL | Status: AC
  Administered 2020-08-20: 20 mg via ORAL
  Filled 2020-08-20: qty 1

## 2020-08-20 MED ORDER — PREDNISONE 20 MG PO TABS: ORAL_TABLET | ORAL | 0 refills | Status: DC

## 2020-08-20 MED ORDER — PREDNISONE 20 MG PO TABS
60.0000 mg | ORAL_TABLET | Freq: Once | ORAL | Status: AC
  Administered 2020-08-20: 60 mg via ORAL
  Filled 2020-08-20: qty 3

## 2020-08-20 MED ORDER — DIPHENHYDRAMINE HCL 25 MG PO CAPS
50.0000 mg | ORAL_CAPSULE | Freq: Once | ORAL | Status: AC
  Administered 2020-08-20: 50 mg via ORAL
  Filled 2020-08-20: qty 2

## 2020-08-20 MED ORDER — EPINEPHRINE 0.3 MG/0.3ML IJ SOAJ: 0.3000 mg | INTRAMUSCULAR | 0 refills | Status: AC | PRN

## 2020-08-20 NOTE — ED Triage Notes (Signed)
[facility] for evaluation of redness on face. Patient reports being allergic to a large number of allergens and states she may be having a reaction to something. EMS reports patient self-administered '25mg'$  benadryl PTA. No oral swelling, no dyspnea.

## 2020-08-20 NOTE — ED Provider Notes (Signed)
[facility] EMERGENCY DEPARTMENT Provider Note   CSN: [id number] Arrival date & time: 08/20/20  1349     History No chief complaint on file.   Jill Dean is a 75 y.o. female.  The history is provided by the patient and medical records. No language interpreter was used.   75 year old female significant history of multiple drugs allergies, Raynaud's syndrome, urticaria, diabetes, obesity, presenting complaining of throat tightness and face redness.  Patient states she recently bought some new friendships including a love seat and a couch over a week ago.  Today she noticed redness about her face, itchiness to the face and arms as well as sensation of throat tightness.  Symptoms started approximately 6 hours ago.  She took 1 Benadryl but because of throat tightness she was concerned prompting this ER visit.  She admits that she has had multiple drugs allergies.  She has been seen by dermatologist and had patch test study recently and was found to be allergic to she believes her symptoms may be due to her new furniture's.  She denies any other environmental changes or medication changes.  She also report having similar skin redness to her carpet that she had at her beach house previously.  She denies lightheadedness, wheezing, shortness of breath, or abdominal cramping.  Past Medical History:  Diagnosis Date   Chest pain    a. s/p normal ett nuclear stress test on 99991111   Complication of anesthesia    COVID-19 12/2019   Diabetes mellitus without complication ([place])    Dysrhythmia    fast heart rate   GERD (gastroesophageal reflux disease)    HLD (hyperlipidemia)    Hypothyroidism    Obesity    Osteoarthrosis, unspecified whether generalized or localized, unspecified site    Other chronic allergic conjunctivitis    PONV (postoperative nausea and vomiting)    Raynaud's syndrome    Urticaria, unspecified     Patient Active Problem List   Diagnosis Date Noted    Hypertriglyceridemia 04/18/2019   Closed nondisplaced fracture of proximal phalanx of lesser toe of right foot 06/05/2017   Non-suppurative otitis media 05/19/2015   PSVT (paroxysmal supraventricular tachycardia) ([place]) 10/29/2013   Essential hypertension 10/29/2013   Type 2 diabetes mellitus without complication ([place]) [id number]   History of tachycardia 10/22/2013   RBBB (right bundle branch block) 10/22/2013   Drug effect 10/22/2013   Chest pain 10/16/2013   Palpitations 10/16/2013   DM (diabetes mellitus) ([place])    Obesity    HLD (hyperlipidemia)    SVT (supraventricular tachycardia) ([place]) 10/15/2013   Hypothyroidism    Endometriosis    RHINOSINUSITIS, ACUTE 02/08/2009   GERD 02/11/2007   Raynaud's syndrome 02/07/2007   Seasonal and perennial allergic rhinitis 02/07/2007   URTICARIA 02/07/2007   OSTEOARTHRITIS 02/07/2007    Past Surgical History:  Procedure Laterality Date   CHOLECYSTECTOMY     CYST EXCISION Left 11/10/2014   Procedure: EXCISION CYST LEFT INDEX FINGER AND CYST LEFT SMALL FINGER ;  Surgeon: [name], MD;  Location: [facility];  Service: Orthopedics;  Laterality: Left;   ESOPHAGOGASTRODUODENOSCOPY N/A 01/05/2014   Procedure: ESOPHAGOGASTRODUODENOSCOPY (EGD);  Surgeon: [name], MD;  Location: [facility];  Service: Endoscopy;  Laterality: N/A;   knot right breast     LEFT HEART CATHETERIZATION WITH CORONARY ANGIOGRAM N/A 10/22/2013   Procedure: LEFT HEART CATHETERIZATION WITH CORONARY ANGIOGRAM;  Surgeon: [name], MD;  Location: [facility];  Service: Cardiovascular;  Laterality:  N/A;   OOPHORECTOMY     LSO 84-RSO 04   thyroid nodule     TONSILLECTOMY     VAGINAL HYSTERECTOMY  2004   LAVH RSO endometriosis   VESICOVAGINAL FISTULA CLOSURE W/ TAH       OB History     Gravida  1   Para  1   Term  1   Preterm      AB      Living  1      SAB      IAB      Ectopic      Multiple      Live Births               Family History  Problem Relation Age of Onset   Lung cancer Father    Heart disease Father    Heart failure Mother    Dementia Mother    Heart disease Maternal Uncle    Lung cancer Paternal Uncle    COPD Paternal Uncle     Social History   Tobacco Use   Smoking status: Never   Smokeless tobacco: Never  Vaping Use   Vaping Use: Never used  Substance Use Topics   Alcohol use: Yes    Alcohol/week: 0.0 standard drinks    Comment: RARE   Drug use: No    Home Medications Prior to Admission medications   Medication Sig Start Date End Date Taking? Authorizing Provider  amitriptyline (ELAVIL) 25 MG tablet Take 25 mg by mouth at bedtime.    [provider]  azelastine (ASTELIN) 0.1 % nasal spray Place into both nostrils as needed for rhinitis. Use in each nostril as directed    [provider]  calcium-vitamin D (OSCAL WITH D) 500-200 MG-UNIT tablet Take 1 tablet by mouth daily with breakfast.     [provider]  cetirizine (ZYRTEC) 10 MG tablet Take 10 mg by mouth daily.    [provider]  doxycycline (VIBRAMYCIN) 100 MG capsule Take 2 today and then 1 daily until finished 06/15/20   Baird Lyons D, MD  fluconazole (DIFLUCAN) 150 MG tablet Take 1 tablet (150 mg total) by mouth every 3 (three) days. 03/15/20   Tamela Gammon, NP  fluconazole (DIFLUCAN) 150 MG tablet Take 1 tablet (150 mg total) by mouth daily. 06/15/20   Deneise Lever, MD  fluticasone (FLONASE) 50 MCG/ACT nasal spray Place into both nostrils daily.    [provider]  glimepiride (AMARYL) 4 MG tablet Take 1 tablet (4 mg total) by mouth daily before breakfast. 06/17/20   Shamleffer, Melanie Crazier, MD  glucose blood (ONETOUCH ULTRA) test strip Use as instructed 02/11/20   Shamleffer, Melanie Crazier, MD  levothyroxine (SYNTHROID, LEVOTHROID) 100 MCG tablet Take 100 mcg by mouth daily.    [provider]  Lutein 20 MG CAPS Take 1 capsule by mouth daily.     [provider]  Magnesium 400 MG CAPS Take 1 tablet by mouth daily.    [provider]  metoprolol succinate (TOPROL-XL) 50 MG 24 hr tablet TAKE 1 TABLET BY MOUTH EVERY DAY WITH OR IMMEDIATELY FOLLOWING A MEAL 06/02/20   Jerline Pain, MD  omeprazole (PRILOSEC) 20 MG capsule Take 1 capsule (20 mg total) by mouth 2 (two) times daily before a meal. 10/23/13   Hongalgi, Lenis Dickinson, MD    Allergies    Hydrocod polst-cpm polst er, Pseudoephedrine, Sulfonamide derivatives, Azithromycin, Buprenorphine hcl, Codeine, Dilaudid [hydromorphone hcl],  Macrodantin [nitrofurantoin], Morphine and related, Shrimp [shellfish allergy], Simvastatin, Atorvastatin, Cephalosporins, Covid-19 (adenovirus) vaccine, Empagliflozin, Erythromycin base, Hydrocod polst-cpm polst er, Hydrocodone-acetaminophen, Metformin hcl, Metronidazole, Other, Penicillinase, Sulfamethoxazole, and Clarithromycin  Review of Systems   Review of Systems  All other systems reviewed and are negative.  Physical Exam Updated Vital Signs BP (!) 161/96 (BP Location: Right Arm)   Pulse 88   Temp 98.9 F (37.2 C) (Oral)   Resp 14   SpO2 98%   Physical Exam Vitals and nursing note reviewed.  Constitutional:      General: She is not in acute distress.    Appearance: She is well-developed.  HENT:     Head: Atraumatic.     Comments: Facial skin is mildly erythematous without obvious urticaria Eyes:     Conjunctiva/sclera: Conjunctivae normal.  Pulmonary:     Effort: Pulmonary effort is normal.  Musculoskeletal:     Cervical back: Neck supple.  Skin:    Findings: No rash.  Neurological:     Mental Status: She is alert.  Psychiatric:        Mood and Affect: Mood normal.    ED Results / Procedures / Treatments   Labs (all labs ordered are listed, but only abnormal results are displayed) Labs Reviewed  BASIC METABOLIC PANEL - Abnormal; Notable for the following components:      Result Value   Glucose, Bld 121 (*)     All other components within normal limits  CBC WITH DIFFERENTIAL/PLATELET    EKG None  Radiology No results found.  Procedures Procedures   Medications Ordered in ED Medications  diphenhydrAMINE (BENADRYL) capsule 50 mg (50 mg Oral Given 08/20/20 1722)  predniSONE (DELTASONE) tablet 60 mg (60 mg Oral Given 08/20/20 1722)  famotidine (PEPCID) tablet 20 mg (20 mg Oral Given 08/20/20 1722)    ED Course  I have reviewed the triage vital signs and the nursing notes.  Pertinent labs & imaging results that were available during my care of the patient were reviewed by me and considered in my medical decision making (see chart for details).    MDM Rules/Calculators/A&P                           BP (!) 159/99   Pulse 86   Temp 98.9 F (37.2 C) (Oral)   Resp 15   SpO2 99%   Final Clinical Impression(s) / ED Diagnoses Final diagnoses:  Urticaria    Rx / DC Orders ED Discharge Orders     None      Patient with significant history of allergies to multiple different drugs in environment.  She developed itchiness and redness to her face and arms as well as sensation of throat tightness earlier today.  She recall purchasing new loveseat and furniture at home several days prior which may contribute to her symptoms. I did discuss options of using EpiPen however we felt due to risk and benefit we preferred close monitoring. Patient was given prednisone, Benadryl, and Pepcid and was monitored in the ED for 6 hours without worsening of symptoms.  Her symptoms did improve.  At this time she is stable for discharge home with prednisone.  She made aware that it can cause a rise in her blood sugar and that needs to be monitored closely.  Care discussed with Dr. Pearline Cables.  Patient also given a prescription for EpiPen and how to use it appropriately.   Domenic Moras, PA-C 08/20/20 1947  Campbell Stall P, DO [id number] [phone]

## 2020-08-20 NOTE — ED Triage Notes (Signed)
Patient states in April this year she put a new carpet in her beach house and when she visited the house she states she had a reaction that she states was treated by her PCP with an antibiotic. Patient states after visiting the home again the symptoms started again. Patient speaking in complete sentences, no dyspnea.

## 2020-08-20 NOTE — ED Notes (Signed)
ED Provider at bedside. 

## 2020-08-20 NOTE — ED Provider Notes (Signed)
TheEmergency Medicine Provider Triage Evaluation Note  Jill Dean , a 75 y.o. female  was evaluated in triage.  Pt complains of facial redness.  She is concerned that is related to exposure to carpet at a new property.  She denies any chest pain or shortness of breath no lightheadedness or dizziness.  She did take Benadryl prior to arrival.  Review of Systems  Positive: Facial redness and itching Negative: Vomiting   Physical Exam  BP (!) 161/96 (BP Location: Right Arm)   Pulse 88   Temp 98.9 F (37.2 C) (Oral)   Resp 14   SpO2 98%  Gen:   Awake, no distress   Resp:  Normal effort  MSK:   Moves extremities without difficulty  Other:  Facial redness  Medical Decision Making  Medically screening exam initiated at 2:32 PM.  Appropriate orders placed.  [name] was informed that the remainder of the evaluation will be completed by another provider, this initial triage assessment does not replace that evaluation, and the importance of remaining in the ED until their evaluation is complete.    Tedd Sias, [place] 08/20/20 1443    Luna Fuse, MD 08/24/20 [phone]

## 2020-08-20 NOTE — Discharge Instructions (Addendum)
Your symptom is likely an allergic reaction, possibly to chemical from your new furniture.  Please consider changing out the new furniture as it may worsen your symptoms.  Take steroid as prescribed, take Benadryl at home as needed but please monitor your blood sugar carefully as it can cause a rise in your blood sugar.  Use EpiPen if your symptoms progress and return to the ER for further care.

## 2020-08-25 ENCOUNTER — Telehealth: Payer: Self-pay | Admitting: Internal Medicine

## 2020-08-25 NOTE — Telephone Encounter (Signed)
Pt called and said she had a reaction to her furniture went to the ED and they put her on Prednisone and she wants advice on what to do.

## 2020-08-25 NOTE — Telephone Encounter (Signed)
Please advise 

## 2020-08-26 NOTE — Telephone Encounter (Signed)
Called and advised pt If her sugars are consistently over 200 then she can take TWO tablets of Glimepiride until finished with prednisone .  If that doesn't work, I can offer her insulin to be used for high sugars. Pt to call back if BS does not come down.

## 2020-09-09 ENCOUNTER — Telehealth: Payer: Self-pay | Admitting: Internal Medicine

## 2020-09-09 MED ORDER — DOXYCYCLINE HYCLATE 100 MG PO TABS: 100.0000 mg | ORAL_TABLET | Freq: Two times a day (BID) | ORAL | 0 refills | Status: DC

## 2020-09-09 NOTE — Telephone Encounter (Signed)
I called and spoke with patient regarding CY recs and sent in doxy to preferred pharmacy. Patient verbalized understanding, sent in doxy to preferred pharmacy and patient will call back if symptoms persist/worsen. Nothing further needed.

## 2020-09-09 NOTE — Telephone Encounter (Signed)
Primary Pulmonologist: Dr. Annamaria Boots Last office visit and with whom: 06/15/20 with Dr. Annamaria Boots What do we see them for (pulmonary problems): Rash, Nasal Congestion, Seasonal and Perennial allergic rhinitis Last OV assessment/plan: see below  Was appointment offered to patient (explain)?    Instructions   Return in about 1 year (around 06/15/2021). Script sent for doxycycline.and Diflucan   Be careful to avoid sun exposure while taking this.   Try otc antihistamine Clortrimeton   your pharmacist can help you find it.   May cause more drowsiness, but it might help the itching   Order- lab- CBC w diff, ANA, Sed Rate, Angioedema panel   dx rash       Reason for call: I called and spoke with patient regarding message. Patient stated she started having running nose yesterday and is now blowing out green-yellow mucous and coughing from drainage. No other symptoms, patient has been taking Astelin spray. Requesting Doxy. Will route to Dr. Annamaria Boots.  Dr. Annamaria Boots, please advise. Thanks!  (examples of things to ask: : When did symptoms start? Fever? Cough? Productive? Color to sputum? More sputum than usual? Wheezing? Have you needed increased oxygen? Are you taking your respiratory medications? What over the counter measures have you tried?)  Allergies  Allergen Reactions   Hydrocod Polst-Cpm Polst Er Other (See Comments)    hyperactivity   Pseudoephedrine Itching, Palpitations and Other (See Comments)    REACTION: tachycardia    Sulfonamide Derivatives Anaphylaxis and Swelling   Azithromycin Itching   Buprenorphine Hcl Itching   Codeine Itching   Dilaudid [Hydromorphone Hcl] Itching   Macrodantin [Nitrofurantoin] Itching    Hands and feet itching. "Trouble breathing" w/lump in throat and stuffy nose.   Morphine And Related Itching   Shrimp [Shellfish Allergy] Itching   Simvastatin Other (See Comments)    Fatigue, depression and dizziness at 20 mg   Atorvastatin     Muscle cramps  And her  blood sugar kept going up   Cephalosporins Swelling    Said to cause facial swelling   Covid-19 (Adenovirus) Vaccine     Other reaction(s): rash, itching, skin tight   Empagliflozin     Other reaction(s): yeast infection   Erythromycin Base Itching and Swelling   Hydrocod Polst-Cpm Polst Er     Other reaction(s): flushing and facial edema   Hydrocodone-Acetaminophen Other (See Comments)    Redness of face, swelling of tolerates tylenol   Metformin Hcl     Other reaction(s): epigastric pain   Metronidazole     Other reaction(s): tonsil swelling   Other     Other reaction(s): swelling eyes   Penicillinase Itching   Sulfamethoxazole Itching   Clarithromycin Itching    REACTION: severe itching    Immunization History  Administered Date(s) Administered   DTaP 06/02/2005   Influenza Split 10/03/2010, 12/11/2010, 10/03/2011, 10/02/2012   Influenza Whole 10/02/2009   Influenza, High Dose Seasonal PF 10/06/2013   Influenza,inj,Quad PF,6+ Mos 09/20/2011, 10/02/2013   PFIZER(Purple Top)SARS-COV-2 Vaccination 07/14/2019   Pneumococcal Conjugate-13 11/04/2014   Pneumococcal Polysaccharide-23 10/02/2001, 01/03/2003   Tdap 09/20/2011   Zoster, Live 11/25/2018

## 2020-09-09 NOTE — Telephone Encounter (Signed)
Doxycycline 100 mg, # 14, 1 twice daily  Saline nasal rinse may also help

## 2020-09-10 ENCOUNTER — Telehealth: Payer: Self-pay | Admitting: Internal Medicine

## 2020-09-10 NOTE — Telephone Encounter (Signed)
Diflucan 100 mg, # 7, 1 daily

## 2020-09-10 NOTE — Telephone Encounter (Signed)
CY pt is requesting that something be called in for her yeast infection.  She was started on doxy on 09/08 by you.   Please advise if ok to send something in.  Thanks  Allergies  Allergen Reactions   Hydrocod Polst-Cpm Polst Er Other (See Comments)    hyperactivity   Pseudoephedrine Itching, Palpitations and Other (See Comments)    REACTION: tachycardia    Sulfonamide Derivatives Anaphylaxis and Swelling   Azithromycin Itching   Buprenorphine Hcl Itching   Codeine Itching   Dilaudid [Hydromorphone Hcl] Itching   Macrodantin [Nitrofurantoin] Itching    Hands and feet itching. "Trouble breathing" w/lump in throat and stuffy nose.   Morphine And Related Itching   Shrimp [Shellfish Allergy] Itching   Simvastatin Other (See Comments)    Fatigue, depression and dizziness at 20 mg   Atorvastatin     Muscle cramps  And her blood sugar kept going up   Cephalosporins Swelling    Said to cause facial swelling   Covid-19 (Adenovirus) Vaccine     Other reaction(s): rash, itching, skin tight   Empagliflozin     Other reaction(s): yeast infection   Erythromycin Base Itching and Swelling   Hydrocod Polst-Cpm Polst Er     Other reaction(s): flushing and facial edema   Hydrocodone-Acetaminophen Other (See Comments)    Redness of face, swelling of tolerates tylenol   Metformin Hcl     Other reaction(s): epigastric pain   Metronidazole     Other reaction(s): tonsil swelling   Other     Other reaction(s): swelling eyes   Penicillinase Itching   Sulfamethoxazole Itching   Clarithromycin Itching    REACTION: severe itching

## 2020-09-11 ENCOUNTER — Other Ambulatory Visit: Payer: Self-pay | Admitting: Internal Medicine

## 2020-09-13 MED ORDER — FLUCONAZOLE 100 MG PO TABS: 100.0000 mg | ORAL_TABLET | Freq: Every day | ORAL | 0 refills | Status: DC

## 2020-09-13 NOTE — Telephone Encounter (Signed)
Called and spoke with pt letting her know the recs stated by CY and she verbalized understanding. Rx for diflucan has been sent to preferred pharmacy for pt. Nothing further needed.

## 2020-09-22 NOTE — Progress Notes (Signed)
Cardiology Office Note:    Date:  09/23/2020   ID:  Jill Dean, DOB 04/03/1945, MRN [id number]  PCP:  Leeroy Cha, MD  [facility] Cardiologist:  Candee Furbish, MD  [facility] Electrophysiologist:  None   Referring MD: Leeroy Cha,*     History of Present Illness:    Jill Dean is a 75 y.o. female is here for a follow-up for hypertension and hyperlipidemia and SVT.   Presumed supraventricular tachycardia of 200 bpm per EMS on monitor, no strips, that resolved with vagal maneuvers in early October 2015. She was placed on diltiazem but felt poorly on this medication and this was stopped. Started Toprol-XL 25 mg once a day.  Maybe had 1 min episode of PSVT, Valsalva maneuver still worked.She also had another episode of PSVT transiently when being tired/exhausted. She did have leg cramps and calf cramps at night.When was on Atorvastatin she stopped after using for 6 days. Her blood sugar was increased and had worsened cramps.    Had cardiac catheterization which was unremarkable. Echocardiogram was reassured. Treadmill test was reassured. Did not wanted to take statins.   She was excited about her daughter, she is currently a grandmother.  06/28/2017-since my last visit, she was seen [name] on 11/22/2016.  Continue with watchful waiting.  Beta-blocker.  Avoided alcohol caffeine.  She did daily exercise.  He did not think she had an atrial tachycardia.  30-day event monitor reviewed sinus rhythm and sinus tachycardia.  No SVT. She did however hit her foot on a metal table, broke her toe.  In a flat shoe support, Dr. Sharol Given.  06/10/2018-She stated that she had 1 brief episode of palpitations that lasted "1 second "the other morning and tolerated her Toprol well.    06/12/2019-was a  follow-up of tachycardia. 115 bpm and take an extra 25 metoprolol.  This seems to help.  She did have Covid back in December.  Most of her family did have this as well.  Fevers for  couple days. Mechanical valve.  Today, she reports she went to the ED 08/20/2020 after inputting a secondary new carpet in her home. She experiences a allergic reaction to where her throat started swelling and facial redness. She thought she was going to die and called her husband to inform him. She found to be allergic to many things since the visit.   She includes she takes a Benadryl to help with her allergies but was told to discontinue by Elmo Putt Gray,DO and take Zrytec which have not been helpful. After visit, she was prescribed Prednisone 20 mg, 4 times daily however effects her blood sugar to rise. She also states blood sugar is high in the mornings and she stopped eating at night to try to help.   She denies any chest pains, fatigue, LE edema, headaches, vomiting, GI or GU symptoms, palpitations,chest pressure, lightheadedness, dizziness or syncope.   Past Medical History:  Diagnosis Date   Chest pain    a. s/p normal ett nuclear stress test on 00/86/76   Complication of anesthesia    COVID-19 12/2019   Diabetes mellitus without complication (HCC)    Dysrhythmia    fast heart rate   GERD (gastroesophageal reflux disease)    HLD (hyperlipidemia)    Hypothyroidism    Obesity    Osteoarthrosis, unspecified whether generalized or localized, unspecified site    Other chronic allergic conjunctivitis    PONV (postoperative nausea and vomiting)    Raynaud's syndrome  Urticaria, unspecified     Past Surgical History:  Procedure Laterality Date   CHOLECYSTECTOMY     CYST EXCISION Left 11/10/2014   Procedure: EXCISION CYST LEFT INDEX FINGER AND CYST LEFT SMALL FINGER ;  Surgeon: [name], MD;  Location: [facility];  Service: Orthopedics;  Laterality: Left;   ESOPHAGOGASTRODUODENOSCOPY N/A 01/05/2014   Procedure: ESOPHAGOGASTRODUODENOSCOPY (EGD);  Surgeon: [name], MD;  Location: [facility];  Service: Endoscopy;  Laterality: N/A;   knot right breast     LEFT  HEART CATHETERIZATION WITH CORONARY ANGIOGRAM N/A 10/22/2013   Procedure: LEFT HEART CATHETERIZATION WITH CORONARY ANGIOGRAM;  Surgeon: [name], MD;  Location: [facility];  Service: Cardiovascular;  Laterality: N/A;   OOPHORECTOMY     LSO 84-RSO 04   thyroid nodule     TONSILLECTOMY     VAGINAL HYSTERECTOMY  2004   LAVH RSO endometriosis   VESICOVAGINAL FISTULA CLOSURE W/ TAH      Current Medications: Current Meds  Medication Sig   amitriptyline (ELAVIL) 25 MG tablet Take 25 mg by mouth at bedtime.   calcium-vitamin D (OSCAL WITH D) 500-200 MG-UNIT tablet Take 1 tablet by mouth daily with breakfast.    cetirizine (ZYRTEC) 10 MG tablet Take 10 mg by mouth daily.   EPINEPHrine 0.3 mg/0.3 mL IJ SOAJ injection Inject 0.3 mg into the muscle as needed for anaphylaxis.   fluticasone (FLONASE) 50 MCG/ACT nasal spray Place into both nostrils daily.   glimepiride (AMARYL) 4 MG tablet Take 1 tablet (4 mg total) by mouth daily before breakfast.   glucose blood (ONETOUCH ULTRA) test strip Use as instructed   ibuprofen (ADVIL) 200 MG tablet Take 400 mg by mouth at bedtime as needed for moderate pain or mild pain.   levothyroxine (SYNTHROID, LEVOTHROID) 100 MCG tablet Take 100 mcg by mouth daily.   Lutein 20 MG CAPS Take 1 capsule by mouth daily.   Magnesium 400 MG CAPS Take 1 tablet by mouth daily.   metoprolol succinate (TOPROL-XL) 50 MG 24 hr tablet TAKE 1 TABLET BY MOUTH EVERY DAY WITH OR IMMEDIATELY FOLLOWING A MEAL   omeprazole (PRILOSEC) 20 MG capsule Take 1 capsule (20 mg total) by mouth 2 (two) times daily before a meal.    Allergies:   Hydrocod polst-cpm polst er, Pseudoephedrine, Sulfonamide derivatives, Azithromycin, Buprenorphine hcl, Codeine, Dilaudid [hydromorphone hcl], Macrodantin [nitrofurantoin], Morphine and related, Shrimp [shellfish allergy], Simvastatin, Atorvastatin, Cephalosporins, Covid-19 (adenovirus) vaccine, Empagliflozin, Erythromycin base, Hydrocod polst-cpm  polst er, Hydrocodone-acetaminophen, Metformin hcl, Metronidazole, Other, Penicillinase, Sulfamethoxazole, and Clarithromycin   Social History   Socioeconomic History   Marital status: Married    Spouse name: Not on file   Number of children: Not on file   Years of education: Not on file   Highest education level: Not on file  Occupational History   Occupation: Beautician  Tobacco Use   Smoking status: Never   Smokeless tobacco: Never  Vaping Use   Vaping Use: Never used  Substance and Sexual Activity   Alcohol use: Yes    Alcohol/week: 0.0 standard drinks    Comment: RARE   Drug use: No   Sexual activity: Not Currently    Birth control/protection: Surgical    Comment: INTERCOURSE AGE UNKOWN , SEXUAL PARTNERS LESS THAN 5  Other Topics Concern   Not on file  Social History Narrative   Not on file   Social Determinants of Health   Financial Resource Strain: Not on file  Food Insecurity:  Not on file  Transportation Needs: Not on file  Physical Activity: Not on file  Stress: Not on file  Social Connections: Not on file     Family History: The patient's family history includes COPD in her paternal uncle; Dementia in her mother; Heart disease in her father and maternal uncle; Heart failure in her mother; Lung cancer in her father and paternal uncle.  ROS:   Please see the history of present illness.    (+)facial redness (+) throat swelling  (+) allergic reactions  All other systems reviewed and are negative.  EKGs/Labs/Other Studies Reviewed:    The following studies were reviewed today:  Segmental Exercise 05/2020:  FINDINGS: Right Lower Extremity Resting ABI:  1.22 Resting TBI: 0.69 Post Exercise ABI:1.08 Segmental Pressures: Normal segmental pressures, no significant (20 mmHg) pressure gradient between adjacent segments. Great toe pressure: 88 Arterial Waveforms: Normal tri-phasic arterial waveforms. PVRs: Normal PVRs with maintained waveform amplitude,  augmentation and quality.  Left Lower Extremity: Resting ABI: 1.23 Resting TBI: 0.87 Post Exercise ABI:1.01 Segmental Pressures: Normal segmental pressures, no significant (20 mmHg) pressure gradient between adjacent segments. Great toe pressure: 111 Arterial Waveforms: Normal tri-phasic arterial waveforms. PVRs: Normal PVRs with maintained waveform amplitude, augmentation and quality.   Other: Symmetric upper extremity pressures.   IMPRESSION: Normal pre- and post-exercise ankle brachial indices. No evidence of flow-limiting stenosis in the bilateral lower extremities.   CARDIAC TELEMETRY 10/2016:  21% of recording was sinus tachycardia/atrial tachycardia at 110bpm. No atrial fibrillation. No rapid SVT Symptoms of fluttering correlate with sinus tachycardiac/atrial tachycardia 110bpm. Occasional PAC's (premature atrial contractions)  EKG: 09/22: sinus rhythm, hr 92 bpm , RBBB.  06/2019: sinus rhythm right bundle branch block 93 bpm left anterior fascicular block  Recent Labs: 08/20/2020: BUN 11; Creatinine, Ser 0.85; Hemoglobin 13.8; Platelets 252; Potassium 3.8; Sodium 139  Recent Lipid Panel    Component Value Date/Time   CHOL 223 (H) 06/21/2015 1007   TRIG 249 (H) 06/21/2015 1007   HDL 59 06/21/2015 1007   CHOLHDL 3.8 06/21/2015 1007   VLDL 50 (H) 06/21/2015 1007   LDLCALC 114 06/21/2015 1007    Physical Exam:    VS:  BP 104/70   Pulse 92   Ht 5' 3.5" (1.613 m)   Wt 238 lb 9.6 oz (108.2 kg)   SpO2 97%   BMI 41.60 kg/m     Wt Readings from Last 3 Encounters:  09/23/20 238 lb 9.6 oz (108.2 kg)  06/17/20 236 lb (107 kg)  06/15/20 240 lb (108.9 kg)     GEN:  Well nourished, well developed in no acute distress HEENT: Normal NECK: No JVD; No carotid bruits LYMPHATICS: No lymphadenopathy CARDIAC: RRR, no murmurs, rubs, gallops RESPIRATORY:  Clear to auscultation without rales, wheezing or rhonchi  ABDOMEN: Soft, non-tender, non-distended MUSCULOSKELETAL:   No edema; No deformity  SKIN: Warm and dry NEUROLOGIC:  Alert and oriented x 3 PSYCHIATRIC:  Normal affect   ASSESSMENT:    1. PSVT (paroxysmal supraventricular tachycardia) ([place])   2. Mixed hyperlipidemia   3. Diabetes mellitus with coincident hypertension ([place])   4. Raynaud's disease without gangrene   5. Seasonal and perennial allergic rhinitis     PLAN:    PSVT (paroxysmal supraventricular tachycardia) Doing well with Toprol, medical management.  No changes made.  Interestingly her aunt Rod Holler had PSVT EP ablated her in [place] with no further recurrence.  Rarely has PVCs and PACs.  She has seen [name] in the past.  HLD (hyperlipidemia) In the past has had some cramping with medication.  Prior LDL 96.  Triglycerides have been in the 300 range in the past.  Continue with diet and exercise.  Diabetes mellitus with coincident hypertension (HCC) Prior hemoglobin A1c 7.0.  Blood pressure currently under excellent control.  Medications reviewed, continue current medical management.  No changes made.  Raynaud's syndrome Continue to monitor.  Stable, doing well.  Seasonal and perennial allergic rhinitis Allergic reaction to for bathroom cleaning solution at home.  Throat felt like it was closing up.  Urticaria.  Seen in the emergency department.  Had allergy testing diffuse.Allergic to several different compounds including rubber type additives.  Takes Zyrtec daily.  Has an EpiPen.   FOLLOW UP IN 1 YEAR  Medication Adjustments/Labs and Tests Ordered: Current medicines are reviewed at length with the patient today.  Concerns regarding medicines are outlined above.  Orders Placed This Encounter  Procedures   EKG 12-Lead    No orders of the defined types were placed in this encounter.   Patient Instructions  Medication Instructions:  The current medical regimen is effective;  continue present plan and medications.  *If you need a refill on your cardiac medications  before your next appointment, please call your pharmacy*  Follow-Up: At [facility], you and your health needs are our priority.  As part of our continuing mission to provide you with exceptional heart care, we have created designated Provider Care Teams.  These Care Teams include your primary Cardiologist (physician) and Advanced Practice Providers (APPs -  Physician Assistants and Nurse Practitioners) who all work together to provide you with the care you need, when you need it.  We recommend signing up for the patient portal called "MyChart".  Sign up information is provided on this After Visit Summary.  MyChart is used to connect with patients for Virtual Visits (Telemedicine).  Patients are able to view lab/test results, encounter notes, upcoming appointments, etc.  Non-urgent messages can be sent to your provider as well.   To learn more about what you can do with MyChart, go to [email or web address].    Your next appointment:   1 year(s)  The format for your next appointment:   In Person  Provider:   Candee Furbish, MD   Thank you for choosing [facility]!!      I,Jada Bradford,acting as a scribe for Candee Furbish, MD.,have documented all relevant documentation on the behalf of Candee Furbish, MD,as directed by  Candee Furbish, MD while in the presence of Candee Furbish, MD.  I, Candee Furbish, MD, have reviewed all documentation for this visit. The documentation on 09/23/20 for the exam, diagnosis, procedures, and orders are all accurate and complete.  Signed, Candee Furbish, MD  09/23/2020 9:17 AM    [facility]

## 2020-09-23 ENCOUNTER — Ambulatory Visit: Payer: PPO | Admitting: Cardiology

## 2020-09-23 ENCOUNTER — Encounter: Payer: Self-pay | Admitting: Cardiology

## 2020-09-23 ENCOUNTER — Other Ambulatory Visit: Payer: Self-pay

## 2020-09-23 DIAGNOSIS — I471 Supraventricular tachycardia: Secondary | ICD-10-CM | POA: Diagnosis not present

## 2020-09-23 DIAGNOSIS — J302 Other seasonal allergic rhinitis: Secondary | ICD-10-CM

## 2020-09-23 DIAGNOSIS — E119 Type 2 diabetes mellitus without complications: Secondary | ICD-10-CM | POA: Diagnosis not present

## 2020-09-23 DIAGNOSIS — E782 Mixed hyperlipidemia: Secondary | ICD-10-CM | POA: Diagnosis not present

## 2020-09-23 DIAGNOSIS — J3089 Other allergic rhinitis: Secondary | ICD-10-CM

## 2020-09-23 DIAGNOSIS — I10 Essential (primary) hypertension: Secondary | ICD-10-CM

## 2020-09-23 DIAGNOSIS — I7300 Raynaud's syndrome without gangrene: Secondary | ICD-10-CM | POA: Diagnosis not present

## 2020-09-23 NOTE — Assessment & Plan Note (Signed)
In the past has had some cramping with medication.  Prior LDL 96.  Triglycerides have been in the 300 range in the past.  Continue with diet and exercise.

## 2020-09-23 NOTE — Assessment & Plan Note (Signed)
Prior hemoglobin A1c 7.0.  Blood pressure currently under excellent control.  Medications reviewed, continue current medical management.  No changes made.

## 2020-09-23 NOTE — Assessment & Plan Note (Signed)
Doing well with Toprol, medical management.  No changes made.  Interestingly her aunt Rod Holler had PSVT EP ablated her in [place] with no further recurrence.  Rarely has PVCs and PACs.  She has seen [name] in the past.

## 2020-09-23 NOTE — Assessment & Plan Note (Signed)
Allergic reaction to for bathroom cleaning solution at home.  Throat felt like it was closing up.  Urticaria.  Seen in the emergency department.  Had allergy testing diffuse.Allergic to several different compounds including rubber type additives.  Takes Zyrtec daily.  Has an EpiPen.

## 2020-09-23 NOTE — Assessment & Plan Note (Signed)
Continue to monitor.  Stable, doing well.

## 2020-09-23 NOTE — Patient Instructions (Signed)
Medication Instructions:  The current medical regimen is effective;  continue present plan and medications.  *If you need a refill on your cardiac medications before your next appointment, please call your pharmacy*  Follow-Up: At [facility], you and your health needs are our priority.  As part of our continuing mission to provide you with exceptional heart care, we have created designated Provider Care Teams.  These Care Teams include your primary Cardiologist (physician) and Advanced Practice Providers (APPs -  Physician Assistants and Nurse Practitioners) who all work together to provide you with the care you need, when you need it.  We recommend signing up for the patient portal called "MyChart".  Sign up information is provided on this After Visit Summary.  MyChart is used to connect with patients for Virtual Visits (Telemedicine).  Patients are able to view lab/test results, encounter notes, upcoming appointments, etc.  Non-urgent messages can be sent to your provider as well.   To learn more about what you can do with MyChart, go to [email or web address].    Your next appointment:   1 year(s)  The format for your next appointment:   In Person  Provider:   Mark Skains, MD   Thank you for choosing [facility]!!    

## 2020-10-05 DIAGNOSIS — Z1231 Encounter for screening mammogram for malignant neoplasm of breast: Secondary | ICD-10-CM | POA: Diagnosis not present

## 2020-10-14 ENCOUNTER — Encounter: Payer: Self-pay | Admitting: Internal Medicine

## 2020-10-14 ENCOUNTER — Other Ambulatory Visit: Payer: Self-pay

## 2020-10-14 ENCOUNTER — Ambulatory Visit (INDEPENDENT_AMBULATORY_CARE_PROVIDER_SITE_OTHER): Payer: PPO | Admitting: Internal Medicine

## 2020-10-14 VITALS — BP 110/68 | HR 70 | Ht 63.5 in | Wt 235.0 lb

## 2020-10-14 DIAGNOSIS — E1165 Type 2 diabetes mellitus with hyperglycemia: Secondary | ICD-10-CM | POA: Diagnosis not present

## 2020-10-14 DIAGNOSIS — E119 Type 2 diabetes mellitus without complications: Secondary | ICD-10-CM

## 2020-10-14 LAB — POCT GLYCOSYLATED HEMOGLOBIN (HGB A1C): Hemoglobin A1C: 8.1 % — AB (ref 4.0–5.6)

## 2020-10-14 MED ORDER — RYBELSUS 3 MG PO TABS: 3.0000 mg | ORAL_TABLET | Freq: Every day | ORAL | 3 refills | Status: DC
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
## 2020-10-14 NOTE — Patient Instructions (Signed)
-   Start Rybelsus 3 mg, 1 tablet before Breakfast  - Continue Glimepiride to 4 mg, before breakfast - you may decrease that to 2 mg      HOW TO TREAT LOW BLOOD SUGARS (Blood sugar LESS THAN 70 MG/DL) Please follow the RULE OF 15 for the treatment of hypoglycemia treatment (when your (blood sugars are less than 70 mg/dL)   STEP 1: Take 15 grams of carbohydrates when your blood sugar is low, which includes:  3-4 GLUCOSE TABS  OR 3-4 OZ OF JUICE OR REGULAR SODA OR ONE TUBE OF GLUCOSE GEL    STEP 2: RECHECK blood sugar in 15 MINUTES STEP 3: If your blood sugar is still low at the 15 minute recheck --> then, go back to STEP 1 and treat AGAIN with another 15 grams of carbohydrates.

## 2020-10-15 DIAGNOSIS — E1165 Type 2 diabetes mellitus with hyperglycemia: Secondary | ICD-10-CM | POA: Insufficient documentation

## 2020-10-22 ENCOUNTER — Other Ambulatory Visit: Payer: Self-pay | Admitting: Internal Medicine

## 2020-10-22 ENCOUNTER — Telehealth: Payer: Self-pay | Admitting: Internal Medicine

## 2020-10-22 MED ORDER — AMOXICILLIN-POT CLAVULANATE 875-125 MG PO TABS
1.0000 | ORAL_TABLET | Freq: Two times a day (BID) | ORAL | 0 refills | Status: DC
Start: 2020-10-22 — End: 2020-10-26

## 2020-10-22 NOTE — Telephone Encounter (Signed)
Patient is aware of recommendations and voiced her understanding.  Augmentin sent to preferred pharmacy. Nothing further needed at this time.

## 2020-10-22 NOTE — Telephone Encounter (Signed)
Called and spoke to patient.  C/o prod cough with yellow sputum, sinus pressure, headache,  head congestion and nasal drainage yellow in color occ mixed with small amount of blood. Sx have been present for 1 week. Sob is baseline. Denies f/c/s or additional sx. No recent covid test.  one covid vaccine.  She is using azelastine nasal spray and Zyrtec once daily.  Dr. Annamaria Boots, please advise. Thanks  Current Outpatient Medications on File Prior to Visit  Medication Sig Dispense Refill   amitriptyline (ELAVIL) 25 MG tablet Take 25 mg by mouth at bedtime.     calcium-vitamin D (OSCAL WITH D) 500-200 MG-UNIT tablet Take 1 tablet by mouth daily with breakfast.      cetirizine (ZYRTEC) 10 MG tablet Take 10 mg by mouth daily.     EPINEPHrine 0.3 mg/0.3 mL IJ SOAJ injection Inject 0.3 mg into the muscle as needed for anaphylaxis. 1 each 0   fluticasone (FLONASE) 50 MCG/ACT nasal spray Place into both nostrils daily.     glimepiride (AMARYL) 4 MG tablet Take 1 tablet (4 mg total) by mouth daily before breakfast. 90 tablet 3   glucose blood (ONETOUCH ULTRA) test strip Use as instructed 100 each 12   ibuprofen (ADVIL) 200 MG tablet Take 400 mg by mouth at bedtime as needed for moderate pain or mild pain.     levothyroxine (SYNTHROID, LEVOTHROID) 100 MCG tablet Take 100 mcg by mouth daily.     Lutein 20 MG CAPS Take 1 capsule by mouth daily.     Magnesium 400 MG CAPS Take 1 tablet by mouth daily.     metoprolol succinate (TOPROL-XL) 50 MG 24 hr tablet TAKE 1 TABLET BY MOUTH EVERY DAY WITH OR IMMEDIATELY FOLLOWING A MEAL 90 tablet 2   omeprazole (PRILOSEC) 20 MG capsule Take 1 capsule (20 mg total) by mouth 2 (two) times daily before a meal. 60 capsule 0   Semaglutide (RYBELSUS) 3 MG TABS Take 3 mg by mouth daily. 30 tablet 3   No current facility-administered medications on file prior to visit.    Allergies  Allergen Reactions   Hydrocod Polst-Cpm Polst Er Other (See Comments)    hyperactivity    Pseudoephedrine Itching, Palpitations and Other (See Comments)    REACTION: tachycardia    Sulfonamide Derivatives Anaphylaxis and Swelling   Azithromycin Itching   Buprenorphine Hcl Itching   Codeine Itching   Dilaudid [Hydromorphone Hcl] Itching   Macrodantin [Nitrofurantoin] Itching    Hands and feet itching. "Trouble breathing" w/lump in throat and stuffy nose.   Morphine And Related Itching   Shrimp [Shellfish Allergy] Itching   Simvastatin Other (See Comments)    Fatigue, depression and dizziness at 20 mg   Atorvastatin     Muscle cramps  And her blood sugar kept going up   Cephalosporins Swelling    Said to cause facial swelling   Covid-19 (Adenovirus) Vaccine     Other reaction(s): rash, itching, skin tight   Empagliflozin     Other reaction(s): yeast infection   Erythromycin Base Itching and Swelling   Hydrocod Polst-Cpm Polst Er     Other reaction(s): flushing and facial edema   Hydrocodone-Acetaminophen Other (See Comments)    Redness of face, swelling of tolerates tylenol   Metformin Hcl     Other reaction(s): epigastric pain   Metronidazole     Other reaction(s): tonsil swelling   Other     Other reaction(s): swelling eyes   Penicillinase Itching   Sulfamethoxazole  Itching   Clarithromycin Itching    REACTION: severe itching

## 2020-10-22 NOTE — Telephone Encounter (Signed)
It looks as if she can take augmentin        So please send augmentin 875 mg, # 14, 1 twice daily.   Let us know if not better.

## 2020-10-26 ENCOUNTER — Other Ambulatory Visit: Payer: Self-pay

## 2020-10-26 MED ORDER — AMOXICILLIN-POT CLAVULANATE 500-125 MG PO TABS: 1.0000 | ORAL_TABLET | Freq: Two times a day (BID) | ORAL | 0 refills | Status: DC

## 2020-10-26 NOTE — Telephone Encounter (Signed)
Augmentin 500 has been sent to preferred pharmacy. Patient is aware and voiced her understanding.  Nothing further needed at this time.

## 2020-10-26 NOTE — Telephone Encounter (Signed)
[facility] store says augmentin 875 mg is on back order  Please replace that prescription with augmentin 500 mg, # 20, 1 twice daily

## 2020-10-26 NOTE — Telephone Encounter (Signed)
Please advise 

## 2020-10-26 NOTE — Addendum Note (Signed)
Addended by: Claudette Head A on: 10/26/2020 04:02 PM   Modules accepted: Orders

## 2020-11-07 DIAGNOSIS — R21 Rash and other nonspecific skin eruption: Secondary | ICD-10-CM | POA: Insufficient documentation

## 2020-11-07 NOTE — Assessment & Plan Note (Signed)
Treating as acute maxillary sinusitis Plan- doxycycline with stand-by diflucan

## 2020-11-07 NOTE — Assessment & Plan Note (Signed)
Nonspecific rash but persistent and of concern to her Plan- labs for ANA, ESR, CBC w diff. F/U with Dermatologist

## 2020-12-15 DIAGNOSIS — H2513 Age-related nuclear cataract, bilateral: Secondary | ICD-10-CM | POA: Diagnosis not present

## 2020-12-15 DIAGNOSIS — H04123 Dry eye syndrome of bilateral lacrimal glands: Secondary | ICD-10-CM | POA: Diagnosis not present

## 2020-12-15 DIAGNOSIS — H52203 Unspecified astigmatism, bilateral: Secondary | ICD-10-CM | POA: Diagnosis not present

## 2020-12-15 DIAGNOSIS — E119 Type 2 diabetes mellitus without complications: Secondary | ICD-10-CM | POA: Diagnosis not present

## 2021-01-02 HISTORY — PX: CATARACT EXTRACTION: SUR2

## 2021-01-17 ENCOUNTER — Ambulatory Visit: Payer: PPO | Admitting: Internal Medicine

## 2021-01-17 ENCOUNTER — Encounter: Payer: Self-pay | Admitting: Internal Medicine

## 2021-01-17 ENCOUNTER — Other Ambulatory Visit: Payer: Self-pay

## 2021-01-17 VITALS — BP 120/76 | HR 86 | Ht 63.5 in | Wt 230.4 lb

## 2021-01-17 DIAGNOSIS — E119 Type 2 diabetes mellitus without complications: Secondary | ICD-10-CM | POA: Diagnosis not present

## 2021-01-17 LAB — POCT GLYCOSYLATED HEMOGLOBIN (HGB A1C): Hemoglobin A1C: 6.9 % — AB (ref 4.0–5.6)

## 2021-01-17 MED ORDER — GLIMEPIRIDE 4 MG PO TABS
4.0000 mg | ORAL_TABLET | Freq: Every day | ORAL | 3 refills | Status: DC
Start: 2021-01-17 — End: 2021-12-05

## 2021-01-17 MED ORDER — RYBELSUS 3 MG PO TABS: 3.0000 mg | ORAL_TABLET | Freq: Every day | ORAL | 3 refills | Status: DC

## 2021-01-17 NOTE — Patient Instructions (Signed)
-   Keep Up the Good Work ! -  Continue  Rybelsus 3 mg, 1 tablet before Breakfast  - Continue Glimepiride to 4 mg, before breakfast - you may decrease that to 2 mg      HOW TO TREAT LOW BLOOD SUGARS (Blood sugar LESS THAN 70 MG/DL) Please follow the RULE OF 15 for the treatment of hypoglycemia treatment (when your (blood sugars are less than 70 mg/dL)   STEP 1: Take 15 grams of carbohydrates when your blood sugar is low, which includes:  3-4 GLUCOSE TABS  OR 3-4 OZ OF JUICE OR REGULAR SODA OR ONE TUBE OF GLUCOSE GEL    STEP 2: RECHECK blood sugar in 15 MINUTES STEP 3: If your blood sugar is still low at the 15 minute recheck --> then, go back to STEP 1 and treat AGAIN with another 15 grams of carbohydrates.
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
## 2021-01-31 DIAGNOSIS — L814 Other melanin hyperpigmentation: Secondary | ICD-10-CM | POA: Diagnosis not present

## 2021-01-31 DIAGNOSIS — L821 Other seborrheic keratosis: Secondary | ICD-10-CM | POA: Diagnosis not present

## 2021-01-31 DIAGNOSIS — D225 Melanocytic nevi of trunk: Secondary | ICD-10-CM | POA: Diagnosis not present

## 2021-02-02 ENCOUNTER — Other Ambulatory Visit (HOSPITAL_COMMUNITY): Payer: Self-pay

## 2021-02-21 ENCOUNTER — Ambulatory Visit (INDEPENDENT_AMBULATORY_CARE_PROVIDER_SITE_OTHER): Payer: PPO

## 2021-02-21 ENCOUNTER — Ambulatory Visit: Payer: PPO | Admitting: Orthopaedic Surgery

## 2021-02-21 ENCOUNTER — Other Ambulatory Visit: Payer: Self-pay

## 2021-02-21 VITALS — Ht 63.5 in | Wt 234.0 lb

## 2021-02-21 DIAGNOSIS — M5442 Lumbago with sciatica, left side: Secondary | ICD-10-CM

## 2021-02-21 DIAGNOSIS — M79605 Pain in left leg: Secondary | ICD-10-CM | POA: Diagnosis not present

## 2021-02-21 DIAGNOSIS — G8929 Other chronic pain: Secondary | ICD-10-CM

## 2021-02-21 MED ORDER — METHOCARBAMOL 750 MG PO TABS: 750.0000 mg | ORAL_TABLET | Freq: Three times a day (TID) | ORAL | 1 refills | Status: DC | PRN

## 2021-02-21 MED ORDER — TRAMADOL HCL 50 MG PO TABS: 50.0000 mg | ORAL_TABLET | Freq: Four times a day (QID) | ORAL | 0 refills | Status: DC | PRN

## 2021-02-21 NOTE — Progress Notes (Signed)
Office Visit Note   Patient: Jill Dean           Date of Birth: February 08, 1945           MRN: [id number] Visit Date: 02/21/2021              Requested by: Leeroy Cha, MD [address] [place],  [place] [address] PCP: Leeroy Cha, MD   Assessment & Plan: Visit Diagnoses:  1. Pain in left leg   2. Chronic left-sided low back pain with left-sided sciatica     Plan: At this point I would like to obtain an MRI of her lumbar spine to rule out nerve compression given her sciatica and her failure of conservative treatment for over a year.  I am going to have her try some tramadol to take on occasion and some Robaxin while we await this MRI.  All questions and concerns were answered and addressed.  Follow-Up Instructions: No follow-ups on file.   Orders:  Orders Placed This Encounter  Procedures   XR HIP UNILAT W OR W/O PELVIS 1V LEFT   XR Lumbar Spine 2-3 Views   Meds ordered this encounter  Medications   methocarbamol (ROBAXIN) 750 MG tablet    Sig: Take 1 tablet (750 mg total) by mouth every 8 (eight) hours as needed for muscle spasms.    Dispense:  60 tablet    Refill:  1   traMADol (ULTRAM) 50 MG tablet    Sig: Take 1 tablet (50 mg total) by mouth every 6 (six) hours as needed.    Dispense:  60 tablet    Refill:  0      Procedures: No procedures performed   Clinical Data: No additional findings.   Subjective: Chief Complaint  Patient presents with   Left Hip - Pain  The patient is a 76 year old diabetic female who comes in for evaluation treatment of left hip pain this been going on for over a year now.  However she points to the lower aspect of her lumbar spine to the left side and her left sciatic area as a source of her pain.  She says it really started hurting after having her COVID vaccinations of which were in her left shoulder.  She does ambulate the walking stick.  She denies any groin pain.  She is a diabetic but has a  hemoglobin A1c of below 7.  She is morbidly obese with a BMI of 40.8.  She is never had back surgery or injections in her back.  She denies any specific injury.  She does take 400 mg of ibuprofen every 4-6 hours and this does help.  HPI  Review of Systems There is currently listed no fever, chills, nausea, vomiting  Objective: Vital Signs: Ht 5' 3.5" (1.613 m)    Wt 234 lb (106.1 kg)    BMI 40.80 kg/m   Physical Exam She is alert and orient x3 and in no acute distress Ortho Exam Examination of both hips show the move smoothly and fluidly.  She does have a positive straight leg raise on the left side and significant low back pain to the left side and sciatic area with pain on sciatic stretch. Specialty Comments:  No specialty comments available.  Imaging: XR HIP UNILAT W OR W/O PELVIS 1V LEFT  Result Date: 02/21/2021 An AP pelvis and lateral left hip shows normal-appearing hip joints bilaterally with normal spacing and articular surface.  XR Lumbar Spine 2-3 Views  Result Date: 02/21/2021 2 views of the lumbar spine show arthritic changes in the posterior elements of the lower lumbar spine as well as significant degenerative disc disease between L5 and S1.    PMFS History: Patient Active Problem List   Diagnosis Date Noted   Rash and nonspecific skin eruption 11/07/2020   Type 2 diabetes mellitus with hyperglycemia, without long-term current use of insulin ([place]) 10/15/2020   Hypertriglyceridemia 04/18/2019   Closed nondisplaced fracture of proximal phalanx of lesser toe of right foot 06/05/2017   Non-suppurative otitis media 05/19/2015   PSVT (paroxysmal supraventricular tachycardia) ([place]) 10/29/2013   Diabetes mellitus with coincident hypertension ([place]) 10/29/2013   Type 2 diabetes mellitus without complication ([place]) 99/37/1696   History of tachycardia 10/22/2013   RBBB (right bundle branch block) 10/22/2013   Drug effect 10/22/2013   Chest pain 10/16/2013   Palpitations  10/16/2013   DM (diabetes mellitus) ([place])    Obesity    HLD (hyperlipidemia)    SVT (supraventricular tachycardia) ([place]) 10/15/2013   Hypothyroidism    Endometriosis    RHINOSINUSITIS, ACUTE 02/08/2009   GERD 02/11/2007   Raynaud's syndrome 02/07/2007   Seasonal and perennial allergic rhinitis 02/07/2007   URTICARIA 02/07/2007   OSTEOARTHRITIS 02/07/2007   Past Medical History:  Diagnosis Date   Chest pain    a. s/p normal ett nuclear stress test on 78/93/81   Complication of anesthesia    COVID-19 12/2019   Diabetes mellitus without complication ([facility])    Dysrhythmia    fast heart rate   GERD (gastroesophageal reflux disease)    HLD (hyperlipidemia)    Hypothyroidism    Obesity    Osteoarthrosis, unspecified whether generalized or localized, unspecified site    Other chronic allergic conjunctivitis    PONV (postoperative nausea and vomiting)    Raynaud's syndrome    Urticaria, unspecified     Family History  Problem Relation Age of Onset   Lung cancer Father    Heart disease Father    Heart failure Mother    Dementia Mother    Heart disease Maternal Uncle    Lung cancer Paternal Uncle    COPD Paternal Uncle     Past Surgical History:  Procedure Laterality Date   CHOLECYSTECTOMY     CYST EXCISION Left 11/10/2014   Procedure: EXCISION CYST LEFT INDEX FINGER AND CYST LEFT SMALL FINGER ;  Surgeon: [name], MD;  Location: [facility];  Service: Orthopedics;  Laterality: Left;   ESOPHAGOGASTRODUODENOSCOPY N/A 01/05/2014   Procedure: ESOPHAGOGASTRODUODENOSCOPY (EGD);  Surgeon: [name], MD;  Location: [facility];  Service: Endoscopy;  Laterality: N/A;   knot right breast     LEFT HEART CATHETERIZATION WITH CORONARY ANGIOGRAM N/A 10/22/2013   Procedure: LEFT HEART CATHETERIZATION WITH CORONARY ANGIOGRAM;  Surgeon: [name], MD;  Location: [facility];  Service: Cardiovascular;  Laterality: N/A;   OOPHORECTOMY     LSO 84-RSO 04   thyroid  nodule     TONSILLECTOMY     VAGINAL HYSTERECTOMY  2004   LAVH RSO endometriosis   VESICOVAGINAL FISTULA CLOSURE W/ TAH     Social History   Occupational History   Occupation: Beautician  Tobacco Use   Smoking status: Never   Smokeless tobacco: Never  Vaping Use   Vaping Use: Never used  Substance and Sexual Activity   Alcohol use: Yes    Alcohol/week: 0.0 standard drinks    Comment: RARE   Drug use: No   Sexual  activity: Not Currently    Birth control/protection: Surgical    Comment: INTERCOURSE AGE UNKOWN , SEXUAL PARTNERS LESS THAN 5

## 2021-02-27 ENCOUNTER — Other Ambulatory Visit: Payer: Self-pay | Admitting: Cardiology

## 2021-03-06 ENCOUNTER — Other Ambulatory Visit: Payer: Self-pay

## 2021-03-06 ENCOUNTER — Ambulatory Visit
Admission: RE | Admit: 2021-03-06 | Discharge: 2021-03-06 | Disposition: A | Discharge status: Home / Self Care | Payer: PPO | Source: Ambulatory Visit | Attending: Orthopaedic Surgery | Admitting: Orthopaedic Surgery

## 2021-03-06 DIAGNOSIS — M48061 Spinal stenosis, lumbar region without neurogenic claudication: Secondary | ICD-10-CM | POA: Diagnosis not present

## 2021-03-06 DIAGNOSIS — G8929 Other chronic pain: Secondary | ICD-10-CM

## 2021-03-08 DIAGNOSIS — M85859 Other specified disorders of bone density and structure, unspecified thigh: Secondary | ICD-10-CM | POA: Diagnosis not present

## 2021-03-08 DIAGNOSIS — J301 Allergic rhinitis due to pollen: Secondary | ICD-10-CM | POA: Diagnosis not present

## 2021-03-08 DIAGNOSIS — K219 Gastro-esophageal reflux disease without esophagitis: Secondary | ICD-10-CM | POA: Diagnosis not present

## 2021-03-08 DIAGNOSIS — Z0000 Encounter for general adult medical examination without abnormal findings: Secondary | ICD-10-CM | POA: Diagnosis not present

## 2021-03-08 DIAGNOSIS — Z7984 Long term (current) use of oral hypoglycemic drugs: Secondary | ICD-10-CM | POA: Diagnosis not present

## 2021-03-08 DIAGNOSIS — E785 Hyperlipidemia, unspecified: Secondary | ICD-10-CM | POA: Diagnosis not present

## 2021-03-08 DIAGNOSIS — G729 Myopathy, unspecified: Secondary | ICD-10-CM | POA: Diagnosis not present

## 2021-03-08 DIAGNOSIS — I872 Venous insufficiency (chronic) (peripheral): Secondary | ICD-10-CM | POA: Diagnosis not present

## 2021-03-08 DIAGNOSIS — I87399 Chronic venous hypertension (idiopathic) with other complications of unspecified lower extremity: Secondary | ICD-10-CM | POA: Diagnosis not present

## 2021-03-08 DIAGNOSIS — Z1389 Encounter for screening for other disorder: Secondary | ICD-10-CM | POA: Diagnosis not present

## 2021-03-08 DIAGNOSIS — E559 Vitamin D deficiency, unspecified: Secondary | ICD-10-CM | POA: Diagnosis not present

## 2021-03-08 DIAGNOSIS — M47816 Spondylosis without myelopathy or radiculopathy, lumbar region: Secondary | ICD-10-CM | POA: Diagnosis not present

## 2021-03-08 DIAGNOSIS — E1165 Type 2 diabetes mellitus with hyperglycemia: Secondary | ICD-10-CM | POA: Diagnosis not present

## 2021-03-08 DIAGNOSIS — I471 Supraventricular tachycardia: Secondary | ICD-10-CM | POA: Diagnosis not present

## 2021-03-11 ENCOUNTER — Telehealth: Payer: Self-pay | Admitting: Pharmacy Technician

## 2021-03-11 ENCOUNTER — Other Ambulatory Visit: Payer: Self-pay

## 2021-03-11 ENCOUNTER — Other Ambulatory Visit (HOSPITAL_COMMUNITY): Payer: Self-pay

## 2021-03-11 MED ORDER — RYBELSUS 3 MG PO TABS: 3.0000 mg | ORAL_TABLET | Freq: Every day | ORAL | 3 refills | Status: DC

## 2021-03-11 NOTE — Telephone Encounter (Signed)
PA has been submitted.

## 2021-03-11 NOTE — Telephone Encounter (Signed)
Patient Advocate Encounter ? ?Received notification from [facility] [employer]) that prior authorization for RYBELSUS '3MG'$  is required. ?  ?PA submitted on 3.10.23 ?Key [id number] ?Status is pending ?  ?[facility] will continue to follow ? ?Kolbie Lepkowski R Angell Pincock, CPhT ?Patient Advocate ?[facility] ?Phone: [phone] ?Fax:  [phone] ? ?

## 2021-03-14 ENCOUNTER — Other Ambulatory Visit (HOSPITAL_COMMUNITY): Payer: Self-pay

## 2021-03-19 ENCOUNTER — Other Ambulatory Visit: Payer: Self-pay | Admitting: Internal Medicine

## 2021-03-23 ENCOUNTER — Encounter: Payer: Self-pay | Admitting: Orthopaedic Surgery

## 2021-03-23 ENCOUNTER — Ambulatory Visit: Payer: PPO | Admitting: Orthopaedic Surgery

## 2021-03-23 ENCOUNTER — Other Ambulatory Visit (HOSPITAL_COMMUNITY): Payer: Self-pay

## 2021-03-23 DIAGNOSIS — M5442 Lumbago with sciatica, left side: Secondary | ICD-10-CM

## 2021-03-23 DIAGNOSIS — G8929 Other chronic pain: Secondary | ICD-10-CM | POA: Diagnosis not present

## 2021-03-23 NOTE — Telephone Encounter (Signed)
Received notification from [facility] regarding a prior authorization for RYBELSUS '3MG'$ . ? ?PRIOR AUTHORIZATION IS APPROVED,  HOWEVER,  INSURANCE WILL ONLY APPROVE #30 TABLETS PER 180 DAYS.  ? ?PRIOR AUTHORIZATION IS TO APPROVE THE MEDICATION NOT TO EXCEED PLAN LIMITATIONS ? ?

## 2021-03-23 NOTE — Progress Notes (Signed)
The patient comes in today to go over MRI of her lumbar spine.  She still reports low back pain on both the left and right sides and it seems to be facet joint mediated.  There is a small sciatic component of her pain.  It does not radiate down into her legs.  There is no weakness in her legs either. ? ?On exam she has pain with flexion extension of the lumbar spine mainly at the facet joints more on the left than the right. ? 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  This is mostly at L4-L5 and some at L5-S1.  There is significant facet joint arthritis at those levels. ? ?I would like to send her to outpatient physical therapy to work on any modalities that can decrease her back pain and improve her back function.  I would also like to send her to Dr. Ernestina Patches for considering bilateral L4-L5 facet joint injections to see if this will help as well.  She is agreeable to this treatment plan.  All question concerns were answered and addressed.  I did give her prescription for outpatient physical therapy.  I will see her back myself in about 2 months.  By then she will of had some therapy and hopefully an intervention by Dr. Ernestina Patches. ?

## 2021-03-24 ENCOUNTER — Other Ambulatory Visit: Payer: Self-pay | Admitting: Internal Medicine

## 2021-03-24 ENCOUNTER — Other Ambulatory Visit: Payer: Self-pay

## 2021-03-24 DIAGNOSIS — G8929 Other chronic pain: Secondary | ICD-10-CM

## 2021-03-24 MED ORDER — RYBELSUS 7 MG PO TABS: 7.0000 mg | ORAL_TABLET | Freq: Every day | ORAL | 3 refills | Status: DC

## 2021-03-29 ENCOUNTER — Telehealth: Payer: Self-pay

## 2021-03-29 NOTE — Telephone Encounter (Signed)
Patient states that she is unable to take  the Rybelsus due to itching and throat swelling.  Please advise  ?

## 2021-03-29 NOTE — Telephone Encounter (Signed)
Patient advised and verbalized understanding 

## 2021-03-31 ENCOUNTER — Telehealth: Payer: Self-pay | Admitting: *Deleted

## 2021-03-31 DIAGNOSIS — M6281 Muscle weakness (generalized): Secondary | ICD-10-CM | POA: Diagnosis not present

## 2021-03-31 DIAGNOSIS — M545 Low back pain, unspecified: Secondary | ICD-10-CM | POA: Diagnosis not present

## 2021-03-31 NOTE — Telephone Encounter (Signed)
Call to patient after leaving voicemail on Triage line. Patient states she thinks she has a yeast infection. States she is feeling a lot of pressure to urinate and burning with urination. States this is how most yeast infections start for her. Denies discharge or odor. Is traveling this weekend so would like to be seen prior. Advised would sent message to appointment desk to facilitate scheduling an appointment. Patient agreeable.  ?

## 2021-04-01 ENCOUNTER — Encounter: Payer: Self-pay | Admitting: Radiology

## 2021-04-01 ENCOUNTER — Ambulatory Visit: Payer: PPO | Admitting: Radiology

## 2021-04-01 VITALS — BP 128/80

## 2021-04-01 DIAGNOSIS — N760 Acute vaginitis: Secondary | ICD-10-CM | POA: Diagnosis not present

## 2021-04-01 DIAGNOSIS — R300 Dysuria: Secondary | ICD-10-CM | POA: Diagnosis not present

## 2021-04-01 LAB — WET PREP FOR TRICH, YEAST, CLUE

## 2021-04-01 LAB — NO CULTURE INDICATED

## 2021-04-01 LAB — URINALYSIS, COMPLETE W/RFL CULTURE
Bacteria, UA: NONE SEEN /HPF
Bilirubin Urine: NEGATIVE
Glucose, UA: NEGATIVE
Hgb urine dipstick: NEGATIVE
Ketones, ur: NEGATIVE
Leukocyte Esterase: NEGATIVE
Nitrites, Initial: NEGATIVE
Protein, ur: NEGATIVE
RBC / HPF: NONE SEEN /HPF (ref 0–2)
Specific Gravity, Urine: 1.01 (ref 1.001–1.035)
WBC, UA: NONE SEEN /HPF (ref 0–5)
pH: 5 (ref 5.0–8.0)

## 2021-04-01 MED ORDER — ESTRADIOL 0.1 MG/GM VA CREA: 1.0000 | TOPICAL_CREAM | VAGINAL | 12 refills | Status: DC

## 2021-04-01 MED ORDER — FLUCONAZOLE 150 MG PO TABS: 150.0000 mg | ORAL_TABLET | ORAL | 0 refills | Status: DC

## 2021-04-01 NOTE — Progress Notes (Addendum)
? ? ? ? ?  Subjective: Jill Dean is a 76 y.o. female s/p hyst Complains of ? Vaginal infection, vaginal burning and itching, urinary frequency, urgency, dysuria, lower pelvic discomfort (x's 2 days) ? ? ? ? ?Review of Systems- see HPI otherwise negative ? ?Objective: ? ?-Vulva: without lesions or discharge ?-Vagina: discharge present, aptima swab and wet prep obtained ?-Cervix: absent ?-Perineum: no lesions ?-Uterus: absent ?-Adnexa: no masses or tenderness ? ? ?Microscopic wet-mount exam shows hyphae.  ?Urine dipstick shows negative for all components.  Micro exam: negative for WBC's or RBC's.  ? ?Chaperone offered and declined. ? ?Assessment:/Plan:  ? ?1. Acute vaginitis ?Diflucan '150mg'$  po x 2 ?Begin estrace cream for prevention  ?- WET PREP FOR TRICH, YEAST, CLUE ? ?2. Dysuria ?Reassured normal u/s ?- Urinalysis,Complete w/RFL Culture ?  ? ?Will contact patient with results of testing completed today. Avoid intercourse until symptoms are resolved. Safe sex encouraged. Avoid the use of soaps or perfumed products in the peri area. Avoid tub baths and sitting in sweaty or wet clothing for prolonged periods of time.   ?

## 2021-04-07 ENCOUNTER — Telehealth: Payer: Self-pay | Admitting: Physical Medicine and Rehabilitation

## 2021-04-07 NOTE — Telephone Encounter (Signed)
Pt returned call to [facility] for referral. Please call pt at [phone]. ?

## 2021-04-11 DIAGNOSIS — M6281 Muscle weakness (generalized): Secondary | ICD-10-CM | POA: Diagnosis not present

## 2021-04-11 DIAGNOSIS — M545 Low back pain, unspecified: Secondary | ICD-10-CM | POA: Diagnosis not present

## 2021-04-15 DIAGNOSIS — M545 Low back pain, unspecified: Secondary | ICD-10-CM | POA: Diagnosis not present

## 2021-04-15 DIAGNOSIS — M6281 Muscle weakness (generalized): Secondary | ICD-10-CM | POA: Diagnosis not present

## 2021-04-18 DIAGNOSIS — M545 Low back pain, unspecified: Secondary | ICD-10-CM | POA: Diagnosis not present

## 2021-04-18 DIAGNOSIS — M6281 Muscle weakness (generalized): Secondary | ICD-10-CM | POA: Diagnosis not present

## 2021-04-21 DIAGNOSIS — M545 Low back pain, unspecified: Secondary | ICD-10-CM | POA: Diagnosis not present

## 2021-04-21 DIAGNOSIS — M6281 Muscle weakness (generalized): Secondary | ICD-10-CM | POA: Diagnosis not present

## 2021-04-25 DIAGNOSIS — M545 Low back pain, unspecified: Secondary | ICD-10-CM | POA: Diagnosis not present

## 2021-04-25 DIAGNOSIS — M6281 Muscle weakness (generalized): Secondary | ICD-10-CM | POA: Diagnosis not present

## 2021-04-29 DIAGNOSIS — M6281 Muscle weakness (generalized): Secondary | ICD-10-CM | POA: Diagnosis not present

## 2021-04-29 DIAGNOSIS — M545 Low back pain, unspecified: Secondary | ICD-10-CM | POA: Diagnosis not present

## 2021-05-02 DIAGNOSIS — M545 Low back pain, unspecified: Secondary | ICD-10-CM | POA: Diagnosis not present

## 2021-05-02 DIAGNOSIS — M6281 Muscle weakness (generalized): Secondary | ICD-10-CM | POA: Diagnosis not present

## 2021-05-03 ENCOUNTER — Telehealth: Payer: Self-pay

## 2021-05-03 ENCOUNTER — Other Ambulatory Visit: Payer: Self-pay

## 2021-05-03 MED ORDER — FLUCONAZOLE 150 MG PO TABS: 150.0000 mg | ORAL_TABLET | Freq: Once | ORAL | 0 refills | Status: AC

## 2021-05-03 NOTE — Telephone Encounter (Signed)
Spoke with patient and read her Jill Dean's reply.  She will try the Estrogen Cream. Rx sent. ?

## 2021-05-03 NOTE — Telephone Encounter (Signed)
Patient called stating she is having exact symptoms she had at 04/01/21 office visit. ? ?She has pressure feeling "down there"/urgency, and vaginal burning and itching. No frequency or dysuria. ? ?She only used the Estrace cream once and was concerned she might be allergic to it because her "throat felt closed up" but she states it could have been the toothpaste because they were traveling and she used [employer].  She has not tried it again to see if it is okay but said she could if you thought she should. ? ?She requested Rx be sent in. I told her I felt Jami, NP is probably going to want to see her. She said she is leaving for the beach Thursday morning. I assured her we would get her in before that if Jami recommends OV. ?

## 2021-05-03 NOTE — Telephone Encounter (Signed)
Ok to send diflucan '150mg'$  #1 no refills. Encouraged her to try estrace cream three times a week. It was likely the toothpaste that caused the reaction. If symptoms don't resolve by the time she gets back from the beach will need to be seen

## 2021-05-04 DIAGNOSIS — M545 Low back pain, unspecified: Secondary | ICD-10-CM | POA: Diagnosis not present

## 2021-05-04 DIAGNOSIS — M6281 Muscle weakness (generalized): Secondary | ICD-10-CM | POA: Diagnosis not present

## 2021-05-04 NOTE — Telephone Encounter (Signed)
Patient seen 04-01-21. Encounter closed.  ?

## 2021-05-09 ENCOUNTER — Ambulatory Visit: Payer: HMO | Admitting: Physical Medicine and Rehabilitation

## 2021-05-09 ENCOUNTER — Ambulatory Visit: Payer: Self-pay

## 2021-05-09 VITALS — BP 130/86 | HR 92

## 2021-05-09 DIAGNOSIS — M47816 Spondylosis without myelopathy or radiculopathy, lumbar region: Secondary | ICD-10-CM | POA: Diagnosis not present

## 2021-05-09 MED ORDER — METHYLPREDNISOLONE ACETATE 80 MG/ML IJ SUSP
80.0000 mg | Freq: Once | INTRAMUSCULAR | Status: AC
  Administered 2021-05-09: 80 mg

## 2021-05-09 NOTE — Patient Instructions (Signed)

## 2021-05-09 NOTE — Progress Notes (Signed)
Numeric Pain Rating Scale and Functional Assessment Average Pain 5   In the last MONTH (on 0-10 scale) has pain interfered with the following?  1. General activity like being  able to carry out your everyday physical activities such as walking, climbing stairs, carrying groceries, or moving a chair?  Rating(5)  Patient is here for an injection. Doing PT twice a week. Takes Ibuprofen as needed. Pain in lower back and radiates into left hip. Pain when standing for long.

## 2021-05-17 DIAGNOSIS — M545 Low back pain, unspecified: Secondary | ICD-10-CM | POA: Diagnosis not present

## 2021-05-17 DIAGNOSIS — M6281 Muscle weakness (generalized): Secondary | ICD-10-CM | POA: Diagnosis not present

## 2021-05-19 DIAGNOSIS — M6281 Muscle weakness (generalized): Secondary | ICD-10-CM | POA: Diagnosis not present

## 2021-05-19 DIAGNOSIS — M545 Low back pain, unspecified: Secondary | ICD-10-CM | POA: Diagnosis not present

## 2021-05-23 ENCOUNTER — Encounter: Payer: Self-pay | Admitting: Orthopaedic Surgery

## 2021-05-23 ENCOUNTER — Ambulatory Visit: Payer: HMO | Admitting: Orthopaedic Surgery

## 2021-05-23 DIAGNOSIS — G8929 Other chronic pain: Secondary | ICD-10-CM

## 2021-05-23 DIAGNOSIS — M47816 Spondylosis without myelopathy or radiculopathy, lumbar region: Secondary | ICD-10-CM | POA: Diagnosis not present

## 2021-05-23 DIAGNOSIS — M5442 Lumbago with sciatica, left side: Secondary | ICD-10-CM

## 2021-05-23 NOTE — Progress Notes (Signed)
The patient comes in for follow-up after having extensive physical therapy on her lumbar spine as well as have an intervention by Dr. Ernestina Patches with I believe facet joint injections on 05/09/2021.  She says those have really helped much better combined with physical therapy.  She does get some random pains on occasion but overall has made great progress.  She is an active 76 year old female.  She has done some yard work as well.  Her mobility of her lumbar spine appears to be much improved in terms of her flexion and extension.  She has negative straight leg raise bilaterally as well.  Follow-up will be as needed at this point and she should continue physical therapy as long as it continues to help her until she transitions to home exercise program.  She is already participating in a home program as well.  All question concerns were answered and addressed.  If she does develop back pain again I would have her seen by Dr. Ernestina Patches to consider repeat injections.

## 2021-05-24 DIAGNOSIS — M6281 Muscle weakness (generalized): Secondary | ICD-10-CM | POA: Diagnosis not present

## 2021-05-24 DIAGNOSIS — M545 Low back pain, unspecified: Secondary | ICD-10-CM | POA: Diagnosis not present

## 2021-05-26 DIAGNOSIS — M545 Low back pain, unspecified: Secondary | ICD-10-CM | POA: Diagnosis not present

## 2021-05-26 DIAGNOSIS — M6281 Muscle weakness (generalized): Secondary | ICD-10-CM | POA: Diagnosis not present

## 2021-05-29 NOTE — Procedures (Signed)
Lumbar Facet Joint Intra-Articular Injection(s) with Fluoroscopic Guidance  Patient: Jill Dean      Date of Birth: 1945/03/02 MRN: [id number] PCP: Leeroy Cha, MD      Visit Date: 05/09/2021   Universal Protocol:    Date/Time: 05/09/2021  Consent Given By: the patient  Position: PRONE   Additional Comments: Vital signs were monitored before and after the procedure. Patient was prepped and draped in the usual sterile fashion. The correct patient, procedure, and site was verified.   Injection Procedure Details:  Procedure Site One Meds Administered:  Meds ordered this encounter  Medications   methylPREDNISolone acetate (DEPO-MEDROL) injection 80 mg     Laterality: Bilateral  Location/Site:  L4-L5  Needle size: 22 guage  Needle type: Spinal  Needle Placement: Articular  Findings:  -Comments: Excellent flow of contrast producing a partial arthrogram.  Procedure Details: The fluoroscope beam is vertically oriented in AP, and the inferior recess is visualized beneath the lower pole of the inferior apophyseal process, which represents the target point for needle insertion. When direct visualization is difficult the target point is located at the medial projection of the vertebral pedicle. The region overlying each aforementioned target is locally anesthetized with a 1 to 2 ml. volume of 1% Lidocaine without Epinephrine.   The spinal needle was inserted into each of the above mentioned facet joints using biplanar fluoroscopic guidance. A 0.25 to 0.5 ml. volume of Isovue-250 was injected and a partial facet joint arthrogram was obtained. A single spot film was obtained of the resulting arthrogram.    One to 1.25 ml of the steroid/anesthetic solution was then injected into each of the facet joints noted above.   Additional Comments:  The patient tolerated the procedure well Dressing: 2 x 2 sterile gauze and Band-Aid    Post-procedure details: Patient was  observed during the procedure. Post-procedure instructions were reviewed.  Patient left the clinic in stable condition.

## 2021-05-29 NOTE — Progress Notes (Signed)
Jill Dean - 76 y.o. female MRN [id number]  Date of birth: [birth year]  Office Visit Note: Visit Date: 05/09/2021 PCP: Leeroy Cha, MD Referred by: Leeroy Cha,*  Subjective: Chief Complaint  Patient presents with   Lower Back - Pain   HPI:  Jill Dean is a 76 y.o. female who comes in today at the request of Dr. Jean Rosenthal for planned Bilateral  L4-5 Lumbar facet/medial branch block with fluoroscopic guidance.  The patient has failed conservative care including home exercise, medications, time and activity modification.  This injection will be diagnostic and hopefully therapeutic.  Please see requesting physician notes for further details and justification.  Exam has shown concordant pain with facet joint loading.  ROS Otherwise per HPI.  Assessment & Plan: Visit Diagnoses:    ICD-10-CM   1. Spondylosis without myelopathy or radiculopathy, lumbar region  M47.816 XR C-ARM NO REPORT    Facet Injection    methylPREDNISolone acetate (DEPO-MEDROL) injection 80 mg      Plan: No additional findings.   Meds & Orders:  Meds ordered this encounter  Medications   methylPREDNISolone acetate (DEPO-MEDROL) injection 80 mg    Orders Placed This Encounter  Procedures   Facet Injection   XR C-ARM NO REPORT    Follow-up: Return if symptoms worsen or fail to improve.   Procedures: No procedures performed  Lumbar Facet Joint Intra-Articular Injection(s) with Fluoroscopic Guidance  Patient: Jill Dean      Date of Birth: Jun 30, 1945 MRN: [id number] PCP: Leeroy Cha, MD      Visit Date: 05/09/2021   Universal Protocol:    Date/Time: 05/09/2021  Consent Given By: the patient  Position: PRONE   Additional Comments: Vital signs were monitored before and after the procedure. Patient was prepped and draped in the usual sterile fashion. The correct patient, procedure, and site was verified.   Injection Procedure Details:  Procedure Site  One Meds Administered:  Meds ordered this encounter  Medications   methylPREDNISolone acetate (DEPO-MEDROL) injection 80 mg     Laterality: Bilateral  Location/Site:  L4-L5  Needle size: 22 guage  Needle type: Spinal  Needle Placement: Articular  Findings:  -Comments: Excellent flow of contrast producing a partial arthrogram.  Procedure Details: The fluoroscope beam is vertically oriented in AP, and the inferior recess is visualized beneath the lower pole of the inferior apophyseal process, which represents the target point for needle insertion. When direct visualization is difficult the target point is located at the medial projection of the vertebral pedicle. The region overlying each aforementioned target is locally anesthetized with a 1 to 2 ml. volume of 1% Lidocaine without Epinephrine.   The spinal needle was inserted into each of the above mentioned facet joints using biplanar fluoroscopic guidance. A 0.25 to 0.5 ml. volume of Isovue-250 was injected and a partial facet joint arthrogram was obtained. A single spot film was obtained of the resulting arthrogram.    One to 1.25 ml of the steroid/anesthetic solution was then injected into each of the facet joints noted above.   Additional Comments:  The patient tolerated the procedure well Dressing: 2 x 2 sterile gauze and Band-Aid    Post-procedure details: Patient was observed during the procedure. Post-procedure instructions were reviewed.  Patient left the clinic in stable condition.    Clinical History: MRI LUMBAR SPINE WITHOUT CONTRAST   TECHNIQUE: Multiplanar, multisequence MR imaging of the lumbar spine was performed. No intravenous contrast was administered.   COMPARISON:  Radiographs  dated February 21, 2021   FINDINGS: Segmentation:  Standard.   Alignment:  4 mm anterolisthesis of L4.   Vertebrae: No fracture, evidence of discitis, or bone lesion. Likely small hemangioma in T12 vertebral body.    Conus medullaris and cauda equina: Conus extends to the L1 level. Conus and cauda equina appear normal.   Paraspinal and other soft tissues: Nonspecific subcutaneous soft tissue edema.   Disc spaces:   T12-L1: No significant disc bulge. No neural foraminal stenosis. No central canal stenosis. Mild facet joint arthropathy   L1-L2: No significant disc bulge. Mild ligamentum flavum hypertrophy. No neural foraminal stenosis. No central canal stenosis. Mild facet joint arthropathy Jill Dean.   L2-L3: Mild circumferential disc protrusion and ligamentum flavum hypertrophy with narrowing of spinal canal. Mild facet joint arthropathy. No significant neural foraminal narrowing.   L3-L4: Circumferential disc protrusion and ligamentum flavum hypertrophy with mild-to-moderate narrowing of spinal canal. Mild narrowing of bilateral lateral recesses. Moderate facet joint arthropathy. No significant neural foraminal narrowing.   L4-L5: Moderate circumferential disc protrusion and ligamentum flavum hypertrophy with narrowing of spinal canal. Narrowing of lateral recesses bilaterally with encroachment of the L5 nerve roots bilaterally. Moderate facet joint arthropathy, left worse than the right. No significant neural foraminal narrowing.   L5-S1: Moderate disc height loss with circumferential disc protrusion and subarticular narrowing bilaterally. Encroachment of the bilateral S1 nerve roots. Moderate facet joint arthropathy. Mild bilateral neural foraminal narrowing at L5.   IMPRESSION: 1.  No evidence of acute fracture or subluxation.   2. 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     Electronically Signed   By: Keane Police D.O.   On: 03/06/2021 14:42     Objective:  VS:  HT:    WT:   BMI:     BP:130/86  HR:92bpm  TEMP: ( )  RESP:  Physical Exam Vitals and nursing note reviewed.  Constitutional:      General: She is not in acute  distress.    Appearance: Normal appearance. She is not ill-appearing.  HENT:     Head: Normocephalic and atraumatic.     Right Ear: External ear normal.     Left Ear: External ear normal.  Eyes:     Extraocular Movements: Extraocular movements intact.  Cardiovascular:     Rate and Rhythm: Normal rate.     Pulses: Normal pulses.  Pulmonary:     Effort: Pulmonary effort is normal. No respiratory distress.  Abdominal:     General: There is no distension.     Palpations: Abdomen is soft.  Musculoskeletal:        General: Tenderness present.     Cervical back: Neck supple.     Right lower leg: No edema.     Left lower leg: No edema.     Comments: Patient has good distal strength with no pain over the greater trochanters.  No clonus or focal weakness.  Skin:    Findings: No erythema, lesion or rash.  Neurological:     General: No focal deficit present.     Mental Status: She is alert and oriented to person, place, and time.     Sensory: No sensory deficit.     Motor: No weakness or abnormal muscle tone.     Coordination: Coordination normal.  Psychiatric:        Mood and Affect: Mood normal.        Behavior: Behavior normal.     Imaging: No  results found.

## 2021-05-31 DIAGNOSIS — M6281 Muscle weakness (generalized): Secondary | ICD-10-CM | POA: Diagnosis not present

## 2021-05-31 DIAGNOSIS — M545 Low back pain, unspecified: Secondary | ICD-10-CM | POA: Diagnosis not present

## 2021-06-02 DIAGNOSIS — M6281 Muscle weakness (generalized): Secondary | ICD-10-CM | POA: Diagnosis not present

## 2021-06-02 DIAGNOSIS — M545 Low back pain, unspecified: Secondary | ICD-10-CM | POA: Diagnosis not present

## 2021-06-03 DIAGNOSIS — T783XXA Angioneurotic edema, initial encounter: Secondary | ICD-10-CM | POA: Diagnosis not present

## 2021-06-03 DIAGNOSIS — E1165 Type 2 diabetes mellitus with hyperglycemia: Secondary | ICD-10-CM | POA: Diagnosis not present

## 2021-06-03 DIAGNOSIS — K219 Gastro-esophageal reflux disease without esophagitis: Secondary | ICD-10-CM | POA: Diagnosis not present

## 2021-06-03 DIAGNOSIS — J9801 Acute bronchospasm: Secondary | ICD-10-CM | POA: Diagnosis not present

## 2021-06-07 DIAGNOSIS — M545 Low back pain, unspecified: Secondary | ICD-10-CM | POA: Diagnosis not present

## 2021-06-07 DIAGNOSIS — M6281 Muscle weakness (generalized): Secondary | ICD-10-CM | POA: Diagnosis not present

## 2021-06-15 ENCOUNTER — Ambulatory Visit: Payer: PPO | Admitting: Internal Medicine

## 2021-06-16 DIAGNOSIS — M6281 Muscle weakness (generalized): Secondary | ICD-10-CM | POA: Diagnosis not present

## 2021-06-16 DIAGNOSIS — M545 Low back pain, unspecified: Secondary | ICD-10-CM | POA: Diagnosis not present

## 2021-06-18 NOTE — Progress Notes (Signed)
    Patient ID: Jill Dean, female    DOB: [birth year], 76 y.o.   MRN: [id number]  HPI female never smoker followed for allergic rhinitis/conjunctivitis, complicated by history Raynaud's syndrome, GERD, urticaria, DM, PSVT  --------------------------------------------------------------------------------------------------  06/15/20- 76 year old female never smoker followed for Allergic Rhinitis/conjunctivitis, complicated by history Raynaud's syndrome, GERD, Urticaria, DM2, Hypothyroid, Dyslipidemia, HTN, PSVT, Covid infection Dec 2021, Hyperlipidemia, Obesity,  -Zyrtec, Flonase, Astelin Covid vax 1 Phizer Patient has some redness and swelling to the right side of her face, eye and and neck. Feels like breathing is good.  Concerned about variable rash on face, worse this morning after sleeping on right side.  Denies change cosmetics or effect of mask or sunblock.  Dermatologist gave topical steroid-some help. Also- nasal congesstion x 2 weeks, green/ yellow nasal discharge, scant blood.  06/20/21- 77 year old female never smoker followed for Allergic Rhinitis/conjunctivitis, complicated by history Raynaud's syndrome, GERD, Urticaria, DM2, Hypothyroid, Dyslipidemia, HTN, PSVT, Covid infection Dec 2021, Hyperlipidemia, Obesity,  -Zyrtec, Flonase, Astelin Covid vax 1 Phizer -----Problems swallowing. Feels like something is in her throat Describes dysphagia and hoarseness as very gradually worse over past year. Asks about allergy testing for this. Doesn't notice reflux, heartburn.Doesn't think it is wheezing or coughing.  Review of Systems-See HPI + = positive Constitutional:   No-   weight loss, night sweats, fevers, chills, fatigue, lassitude. HEENT:    headaches, +difficulty swallowing, tooth/dental problems, +sore throat,       sneezing, itching, ear ache, +nasal congestion, +post nasal drip,  CV:  No-   chest pain, orthopnea, PND, swelling in lower extremities, anasarca,  dizziness,  palpitations Resp: No-   shortness of breath with exertion or at rest.            -productive cough,  No non-productive cough,  No-  coughing up of blood.              -change in color of mucus.  No- wheezing.    Objective:   Physical Exam General- Alert, Oriented, Affect-appropriate, Distress- none acute  +Obese Skin- +faint erythema butterfly distribution Lymphadenopathy- none Head- atraumatic            Eyes- Gross vision intact, PERRLA, conjunctivae clear secretions, +periorbital edema            Ears- Hearing,             Nose-  +turbinate edema , No-Septal dev,  polyps, erosion, perforation,             Throat- Mallampati III-IV , mucosa clear-not red , drainage- none, tonsils- atrophic., + raspy voice  Neck- flexible , trachea midline, no stridor , thyroid nl, carotid no bruit Chest -            Lung- clear to P&A but diminished, wheeze- none, cough- none , dullness-none, rub- none           Cardiac- RRR           Chest wall-  Abd-  Br/ Gen/ Rectal- Not done, not indicated Extrem- cyanosis- none, clubbing, none, atrophy- none, strength- nl, + superficial varices Neuro- grossly intact to observation

## 2021-06-20 ENCOUNTER — Ambulatory Visit (INDEPENDENT_AMBULATORY_CARE_PROVIDER_SITE_OTHER): Payer: HMO

## 2021-06-20 ENCOUNTER — Ambulatory Visit: Payer: HMO | Admitting: Internal Medicine

## 2021-06-20 ENCOUNTER — Encounter: Payer: Self-pay | Admitting: Internal Medicine

## 2021-06-20 VITALS — BP 124/70 | HR 85 | Temp 97.7°F | Ht 63.5 in | Wt 233.6 lb

## 2021-06-20 DIAGNOSIS — R490 Dysphonia: Secondary | ICD-10-CM

## 2021-06-20 DIAGNOSIS — J3089 Other allergic rhinitis: Secondary | ICD-10-CM | POA: Diagnosis not present

## 2021-06-20 DIAGNOSIS — J302 Other seasonal allergic rhinitis: Secondary | ICD-10-CM | POA: Diagnosis not present

## 2021-06-20 DIAGNOSIS — J00 Acute nasopharyngitis [common cold]: Secondary | ICD-10-CM | POA: Diagnosis not present

## 2021-06-20 DIAGNOSIS — R0602 Shortness of breath: Secondary | ICD-10-CM | POA: Diagnosis not present

## 2021-06-20 DIAGNOSIS — R0609 Other forms of dyspnea: Secondary | ICD-10-CM | POA: Diagnosis not present

## 2021-06-20 NOTE — Patient Instructions (Signed)
Order- Environmental allergy panel   (RAST)     dx allergic rhinitis  Order- CXR     Dx dyspnea on exertion  Order- referral to [facility]     dx hoarseness

## 2021-06-21 DIAGNOSIS — M545 Low back pain, unspecified: Secondary | ICD-10-CM | POA: Diagnosis not present

## 2021-06-21 DIAGNOSIS — M6281 Muscle weakness (generalized): Secondary | ICD-10-CM | POA: Diagnosis not present

## 2021-06-23 DIAGNOSIS — M6281 Muscle weakness (generalized): Secondary | ICD-10-CM | POA: Diagnosis not present

## 2021-06-23 DIAGNOSIS — M545 Low back pain, unspecified: Secondary | ICD-10-CM | POA: Diagnosis not present

## 2021-06-23 LAB — ALLERGEN PANEL (27) + IGE
Alternaria Alternata IgE: <0.1 kU/L
Aspergillus Fumigatus IgE: <0.1 kU/L
Bahia Grass IgE: <0.1 kU/L
Bermuda Grass IgE: <0.1 kU/L
Cat Dander IgE: <0.1 kU/L
Cedar, Mountain IgE: <0.1 kU/L
Cladosporium Herbarum IgE: <0.1 kU/L
Cocklebur IgE: <0.1 kU/L
Cockroach, American IgE: <0.1 kU/L
Common Silver Birch IgE: <0.1 kU/L
D Farinae IgE: <0.1 kU/L
D Pteronyssinus IgE: <0.1 kU/L
Dog Dander IgE: <0.1 kU/L
Elm, American IgE: <0.1 kU/L
Hickory, White IgE: <0.1 kU/L
IgE (Immunoglobulin E), Serum: 47 IU/mL (ref 6–495)
Johnson Grass IgE: <0.1 kU/L
Kentucky Bluegrass IgE: <0.1 kU/L
Maple/Box Elder IgE: <0.1 kU/L
Mucor Racemosus IgE: <0.1 kU/L
Oak, White IgE: <0.1 kU/L
Penicillium Chrysogen IgE: <0.1 kU/L
Pigweed, Rough IgE: <0.1 kU/L
Plantain, English IgE: <0.1 kU/L
Ragweed, Short IgE: <0.1 kU/L
Setomelanomma Rostrat: <0.1 kU/L
Timothy Grass IgE: <0.1 kU/L
White Mulberry IgE: <0.1 kU/L

## 2021-07-18 ENCOUNTER — Encounter: Payer: Self-pay | Admitting: Internal Medicine

## 2021-07-18 ENCOUNTER — Other Ambulatory Visit: Payer: Self-pay | Admitting: Internal Medicine

## 2021-07-18 ENCOUNTER — Ambulatory Visit: Payer: PPO | Admitting: Internal Medicine

## 2021-07-18 VITALS — BP 124/80 | HR 92 | Ht 63.5 in | Wt 234.0 lb

## 2021-07-18 DIAGNOSIS — E039 Hypothyroidism, unspecified: Secondary | ICD-10-CM | POA: Diagnosis not present

## 2021-07-18 DIAGNOSIS — H2513 Age-related nuclear cataract, bilateral: Secondary | ICD-10-CM | POA: Diagnosis not present

## 2021-07-18 DIAGNOSIS — E1165 Type 2 diabetes mellitus with hyperglycemia: Secondary | ICD-10-CM

## 2021-07-18 DIAGNOSIS — E119 Type 2 diabetes mellitus without complications: Secondary | ICD-10-CM | POA: Diagnosis not present

## 2021-07-18 LAB — POCT GLUCOSE (DEVICE FOR HOME USE): POC Glucose: 151 mg/dl — AB (ref 70–99)

## 2021-07-18 LAB — TSH: TSH: 3.13 u[IU]/mL (ref 0.35–5.50)

## 2021-07-18 MED ORDER — FREESTYLE LIBRE 2 SENSOR MISC: 1.0000 | 3 refills | Status: DC

## 2021-07-18 MED ORDER — RYBELSUS 3 MG PO TABS: 3.0000 mg | ORAL_TABLET | Freq: Every day | ORAL | 3 refills | Status: DC

## 2021-07-18 NOTE — Progress Notes (Unsigned)
Name: Jill Dean  Age/ Sex: 76 y.o., female   MRN/ DOB: [id number], Apr 26, 1945     PCP: Leeroy Cha, MD   Reason for Endocrinology Evaluation: Type 2 Diabetes Mellitus  Initial Endocrine Consultative Visit: 04/18/2019    PATIENT IDENTIFIER: Jill Dean is a 76 y.o. female with a past medical history of T2DM, Hypothyroidism and Dyslipidemia. The patient has followed with Endocrinology clinic since 04/18/2019 for consultative assistance with management of her diabetes.  DIABETIC HISTORY:  Ms. Hark was diagnosed with DM in 2013. Metformin - GI issues, Jardiance - recurrent yeast infection, Glipizide- Hives . Her hemoglobin A1c has ranged from 7.0% in 2015, peaking at 7.8% in 2020.  In her initial visit to our clinic she had an A1c of 7.0%, we stopped Jardiance due to recurrent yeast infections. She is allergic due to Glipizide  Stop Rybelsus March 2023 due to pruritus  SUBJECTIVE:   During the last visit (01/17/2021): A1c 6.9% , We continued  Glimepiride and  Rybelsus   Today (07/18/2021): [name] is here for a follow up on diabetes management.  She checks her blood sugars 2 times daily. The patient has not had hypoglycemic episodes since the last clinic visit.   Follows with Dr. Annamaria Boots  She would like to try rybelsus to 3 mg tabs as she did fine on them until she went to 7 mg tabs  She interested in CGM technology   Denies vomiting or diarrhea  She has not been feeling well and would like thyroid to be checked , she is on Synthroid   HOME DIABETES REGIMEN:  Glimepiride 4 mg, 1 tab daily       Statin: Intolerant ACE-I/ARB: no    METER DOWNLOAD SUMMARY:  DIABETIC COMPLICATIONS: Microvascular complications:    Denies: CKD, retinopathy , neuropathy  Last eye exam: Completed 07/18/2021   Macrovascular complications:    Denies: CAD, PVD, CVA     HISTORY:  Past Medical History:  Past Medical History:  Diagnosis Date   Chest pain    a. s/p normal ett  nuclear stress test on 82/50/53   Complication of anesthesia    COVID-19 12/2019   Diabetes mellitus without complication ([place])    Dysrhythmia    fast heart rate   GERD (gastroesophageal reflux disease)    HLD (hyperlipidemia)    Hypothyroidism    Obesity    Osteoarthrosis, unspecified whether generalized or localized, unspecified site    Other chronic allergic conjunctivitis    PONV (postoperative nausea and vomiting)    Raynaud's syndrome    Urticaria, unspecified    Past Surgical History:  Past Surgical History:  Procedure Laterality Date   CHOLECYSTECTOMY     CYST EXCISION Left 11/10/2014   Procedure: EXCISION CYST LEFT INDEX FINGER AND CYST LEFT SMALL FINGER ;  Surgeon: [name], MD;  Location: [facility];  Service: Orthopedics;  Laterality: Left;   ESOPHAGOGASTRODUODENOSCOPY N/A 01/05/2014   Procedure: ESOPHAGOGASTRODUODENOSCOPY (EGD);  Surgeon: [name], MD;  Location: [facility];  Service: Endoscopy;  Laterality: N/A;   knot right breast     LEFT HEART CATHETERIZATION WITH CORONARY ANGIOGRAM N/A 10/22/2013   Procedure: LEFT HEART CATHETERIZATION WITH CORONARY ANGIOGRAM;  Surgeon: [name], MD;  Location: [facility];  Service: Cardiovascular;  Laterality: N/A;   OOPHORECTOMY     LSO 84-RSO 04   thyroid nodule     TONSILLECTOMY     VAGINAL HYSTERECTOMY  2004   LAVH RSO  endometriosis   VESICOVAGINAL FISTULA CLOSURE W/ TAH     Social History:  reports that she has never smoked. She has never used smokeless tobacco. She reports current alcohol use. She reports that she does not use drugs. Family History:  Family History  Problem Relation Age of Onset   Lung cancer Father    Heart disease Father    Heart failure Mother    Dementia Mother    Heart disease Maternal Uncle    Lung cancer Paternal Uncle    COPD Paternal Uncle      HOME MEDICATIONS: Allergies as of 07/18/2021       Reactions   Hydrocod Poli-chlorphe Poli Er Other (See  Comments)   hyperactivity   Pseudoephedrine Itching, Palpitations, Other (See Comments)   REACTION: tachycardia   Sulfonamide Derivatives Anaphylaxis, Swelling   Azithromycin Itching   Buprenorphine Hcl Itching   Codeine Itching   Dilaudid [hydromorphone Hcl] Itching   Macrodantin [nitrofurantoin] Itching   Hands and feet itching. "Trouble breathing" w/lump in throat and stuffy nose.   Morphine And Related Itching   Shrimp [shellfish Allergy] Itching   Simvastatin Other (See Comments)   Fatigue, depression and dizziness at 20 mg   Atorvastatin    Muscle cramps  And her blood sugar kept going up   Cephalosporins Swelling   Said to cause facial swelling   Covid-19 (adenovirus) Vaccine    Other reaction(s): rash, itching, skin tight   Empagliflozin    Other reaction(s): yeast infection   Erythromycin Base Itching, Swelling   Hydrocod Poli-chlorphe Poli Er    Other reaction(s): flushing and facial edema   Hydrocodone-acetaminophen Other (See Comments)   Redness of face, swelling of tolerates tylenol   Metformin Hcl    Other reaction(s): epigastric pain   Metronidazole    Other reaction(s): tonsil swelling   Other    Rybelsus---anaphylaxsis   Penicillinase Itching   Sulfamethoxazole Itching   Clarithromycin Itching   REACTION: severe itching        Medication List        Accurate as of July 18, 2021 11:26 AM. If you have any questions, ask your nurse or doctor.          STOP taking these medications    ezetimibe 10 MG tablet Commonly known as: ZETIA Stopped by: Dorita Sciara, MD       TAKE these medications    amitriptyline 25 MG tablet Commonly known as: ELAVIL Take 25 mg by mouth at bedtime.   calcium-vitamin D 500-200 MG-UNIT tablet Commonly known as: OSCAL WITH D Take 1 tablet by mouth daily with breakfast.   cetirizine 10 MG tablet Commonly known as: ZYRTEC Take 10 mg by mouth daily.   EPINEPHrine 0.3 mg/0.3 mL Soaj  injection Commonly known as: EPI-PEN Inject 0.3 mg into the muscle as needed for anaphylaxis.   estradiol 0.1 MG/GM vaginal cream Commonly known as: ESTRACE VAGINAL Place 1 Applicatorful vaginally 3 (three) times a week.   fluconazole 150 MG tablet Commonly known as: DIFLUCAN Take 1 tablet (150 mg total) by mouth every 3 (three) days.   fluticasone 50 MCG/ACT nasal spray Commonly known as: FLONASE Place into both nostrils daily.   FreeStyle Libre 2 Sensor Misc 1 Device by Does not apply route every 14 (fourteen) days. Started by: Dorita Sciara, MD   glimepiride 4 MG tablet Commonly known as: AMARYL Take 1 tablet (4 mg total) by mouth daily before breakfast.   ibuprofen 200 MG tablet  Commonly known as: ADVIL Take 400 mg by mouth at bedtime as needed for moderate pain or mild pain.   levothyroxine 100 MCG tablet Commonly known as: SYNTHROID Take 100 mcg by mouth daily.   Lutein 20 MG Caps Take 1 capsule by mouth daily.   Magnesium 400 MG Caps Take 1 tablet by mouth daily.   methocarbamol 750 MG tablet Commonly known as: ROBAXIN Take 1 tablet (750 mg total) by mouth every 8 (eight) hours as needed for muscle spasms.   metoprolol succinate 50 MG 24 hr tablet Commonly known as: TOPROL-XL TAKE 1 TABLET BY MOUTH EVERY DAY WITH OR IMMEDIATELY FOLLOWING A MEAL   omeprazole 20 MG capsule Commonly known as: PRILOSEC Take 1 capsule (20 mg total) by mouth 2 (two) times daily before a meal.   OneTouch Ultra test strip Generic drug: glucose blood USE AS INSTRUCTED   Rybelsus 3 MG Tabs Generic drug: Semaglutide Take 3 mg by mouth daily. Started by: Dorita Sciara, MD   traMADol 50 MG tablet Commonly known as: ULTRAM Take 1 tablet (50 mg total) by mouth every 6 (six) hours as needed.         OBJECTIVE:   Vital Signs: BP 124/80 (BP Location: Left Arm, Patient Position: Sitting, Cuff Size: Large)   Pulse 92   Ht 5' 3.5" (1.613 m)   Wt 234 lb (106.1  kg)   SpO2 95%   BMI 40.80 kg/m   Wt Readings from Last 3 Encounters:  07/18/21 234 lb (106.1 kg)  06/20/21 233 lb 9.6 oz (106 kg)  02/21/21 234 lb (106.1 kg)     Exam: General: Pt appears well and is in NAD  Lungs: Clear with good BS bilat   Heart: RRR   Extremities: No pretibial edema.   Neuro: MS is good with appropriate affect, pt is alert and Ox3   DM foot exam:07/18/2021   The skin of the feet without sores or ulcerations. Plantar callous formation noted bilaterally  The pedal pulses are 2+ on right and 2+ on left. The sensation is intact to a screening 5.07, 10 gram monofilament bilaterally     DATA REVIEWED:  Lab Results  Component Value Date   HGBA1C 6.9 (A) 01/17/2021   HGBA1C 8.1 (A) 10/14/2020   HGBA1C 7.6 (A) 06/17/2020   06/03/2021 A1c 7.4%    03/08/2021 BUN 17 Cr . 0.860 GFR 70 lDL 138   ASSESSMENT / PLAN / RECOMMENDATIONS:   1) Type 2 Diabetes Mellitus, Optimally  controlled, Without complications - Most recent A1c of 7.4  %. Goal A1c < 7.0 %.    Patient is intolerant to Jardiance due to recurrent vaginal yeast infections, and intolerant to Metformin as well as Glipizide - rash  She was provided with samples of Rybelsus 3 mg , she developed pruritus with 7 mg tabs  NO change to glimepiride  I had prescribed freestyle libre, but she understands this is dependant on her insurance as she is not on insulin     MEDICATIONS:  - Continue  Glimepiride  4 mg daily  - Restart Rybelsus 3 mg daily   EDUCATION / INSTRUCTIONS: BG monitoring instructions: Patient is instructed to check her blood sugars 1 times a day, fasting . Call [facility] if: BG persistently < 70      2) Diabetic complications:  Eye: Does not  have known diabetic retinopathy.  Neuro/ Feet: Does not have known diabetic peripheral neuropathy .  Renal: Patient does not have  known baseline CKD. She   is not on an ACEI/ARB at present.   3) Hypothyroidism:   -  She has not bee felling well  - TSH ***  F/U in 4 months    Signed electronically by: Mack Guise, MD  [facility] End[facility]in[facility]  [address] [place],  [address] Phone: [phone] FAX: [phone]   CC: Leeroy Cha, MD [address] [place] [address] Phone: [phone]  Fax: [phone]  Return to Endocrinology clinic as below: Future Appointments  Date Time Provider [place]  06/22/2022 11:00 AM Deneise Lever, MD [facility] None

## 2021-07-18 NOTE — Patient Instructions (Signed)
-   Restart  Rybelsus 3 mg, 1 tablet before Breakfast  - Continue Glimepiride to 4 mg, before breakfast - you may decrease that to 2 mg      HOW TO TREAT LOW BLOOD SUGARS (Blood sugar LESS THAN 70 MG/DL) Please follow the RULE OF 15 for the treatment of hypoglycemia treatment (when your (blood sugars are less than 70 mg/dL)   STEP 1: Take 15 grams of carbohydrates when your blood sugar is low, which includes:  3-4 GLUCOSE TABS  OR 3-4 OZ OF JUICE OR REGULAR SODA OR ONE TUBE OF GLUCOSE GEL    STEP 2: RECHECK blood sugar in 15 MINUTES STEP 3: If your blood sugar is still low at the 15 minute recheck --> then, go back to STEP 1 and treat AGAIN with another 15 grams of carbohydrates.

## 2021-07-19 ENCOUNTER — Telehealth: Payer: Self-pay | Admitting: Pharmacy Technician

## 2021-07-19 ENCOUNTER — Other Ambulatory Visit (HOSPITAL_COMMUNITY): Payer: Self-pay

## 2021-07-19 NOTE — Telephone Encounter (Signed)
Patient Advocate Encounter   Received notification from MD that prior authorization for Rybelsus '3mg'$  is required/requested. Ins only allows 60 tabs in 365 days. Request for daily.   PA submitted on 07/19/21 to [employer] verbally over the phone [phone] Call ref # [id number]  expect response in 2-3 days Status is pending  Pharmacy Patient Advocate Fax:  [phone]

## 2021-07-20 ENCOUNTER — Telehealth: Payer: Self-pay

## 2021-07-20 NOTE — Telephone Encounter (Signed)
Patient is calling to follow up on the Rybelsus.  Are we able to get '3mg'$  approved.

## 2021-07-21 ENCOUNTER — Telehealth: Payer: Self-pay

## 2021-07-21 NOTE — Telephone Encounter (Signed)
Patient states that she is not able to take any dose of the Rybelsus because it causing her to itch really bad. Are there any other alternatives?

## 2021-07-25 DIAGNOSIS — H2513 Age-related nuclear cataract, bilateral: Secondary | ICD-10-CM | POA: Diagnosis not present

## 2021-07-25 NOTE — Telephone Encounter (Signed)
Patient Advocate Encounter  Received notification from [employer] that the request for prior authorization for Rybelsus '3mg'$  has been denied due to this medication just being intended to initiate treatment and it not beeing effective for glycemic control.     Per recent note, pt isn't able to take this anyway. She has continued to have the itching, even when going off the '7mg'$  back to the '3mg'$  dose.

## 2021-07-27 ENCOUNTER — Other Ambulatory Visit: Payer: Self-pay

## 2021-07-27 MED ORDER — FREESTYLE LIBRE 2 READER DEVI: 0 refills | Status: DC

## 2021-07-31 DIAGNOSIS — R490 Dysphonia: Secondary | ICD-10-CM | POA: Insufficient documentation

## 2021-07-31 NOTE — Assessment & Plan Note (Signed)
I don't think her voice complaint reflects postnasal drip, but we will get venous allergy test and offer referral based on results

## 2021-07-31 NOTE — Assessment & Plan Note (Signed)
Persistent hoarseness with some dysphagia Plan- referral to ENT, then further w/u per them, or Swallowing eval/ GI

## 2021-08-30 DIAGNOSIS — H269 Unspecified cataract: Secondary | ICD-10-CM | POA: Diagnosis not present

## 2021-08-30 DIAGNOSIS — H2512 Age-related nuclear cataract, left eye: Secondary | ICD-10-CM | POA: Diagnosis not present

## 2021-09-08 DIAGNOSIS — R0989 Other specified symptoms and signs involving the circulatory and respiratory systems: Secondary | ICD-10-CM | POA: Diagnosis not present

## 2021-09-13 DIAGNOSIS — H269 Unspecified cataract: Secondary | ICD-10-CM | POA: Diagnosis not present

## 2021-09-13 DIAGNOSIS — H2511 Age-related nuclear cataract, right eye: Secondary | ICD-10-CM | POA: Diagnosis not present

## 2021-09-13 DIAGNOSIS — Z961 Presence of intraocular lens: Secondary | ICD-10-CM | POA: Diagnosis not present

## 2021-10-11 DIAGNOSIS — Z1231 Encounter for screening mammogram for malignant neoplasm of breast: Secondary | ICD-10-CM | POA: Diagnosis not present

## 2021-10-27 DIAGNOSIS — E785 Hyperlipidemia, unspecified: Secondary | ICD-10-CM | POA: Diagnosis not present

## 2021-10-27 DIAGNOSIS — E1165 Type 2 diabetes mellitus with hyperglycemia: Secondary | ICD-10-CM | POA: Diagnosis not present

## 2021-10-27 DIAGNOSIS — T783XXA Angioneurotic edema, initial encounter: Secondary | ICD-10-CM | POA: Diagnosis not present

## 2021-11-02 ENCOUNTER — Ambulatory Visit: Payer: PPO | Admitting: Orthopaedic Surgery

## 2021-11-02 ENCOUNTER — Ambulatory Visit (INDEPENDENT_AMBULATORY_CARE_PROVIDER_SITE_OTHER): Payer: PPO

## 2021-11-02 ENCOUNTER — Encounter: Payer: Self-pay | Admitting: Orthopaedic Surgery

## 2021-11-02 DIAGNOSIS — M25511 Pain in right shoulder: Secondary | ICD-10-CM

## 2021-11-02 DIAGNOSIS — M7541 Impingement syndrome of right shoulder: Secondary | ICD-10-CM | POA: Diagnosis not present

## 2021-11-02 DIAGNOSIS — G8929 Other chronic pain: Secondary | ICD-10-CM

## 2021-11-02 NOTE — Progress Notes (Signed)
Office Visit Note   Patient: Jill Dean           Date of Birth: January 06, 1945           MRN: [id number] Visit Date: 11/02/2021              Requested by: Leeroy Cha, MD [address] [place],  [place] [address] PCP: Leeroy Cha, MD   Assessment & Plan: Visit Diagnoses:  1. Chronic right shoulder pain   2. Impingement syndrome of right shoulder     Plan: Jill Dean relates a chronic history of recurrent right shoulder pain.  She was a hairdresser for many many years and thinks that that "had something to do with it".  She recently had a return trip from the beach while driving had an acute onset of shoulder pain without obvious injury or trauma.  She has had some difficulty raising her arm over her head.  She has tried ibuprofen.  She is right-handed.  She did have positive impingement and some tenderness along the anterior subacromial region.  There was some minimal degenerative changes by plain film.  My concern is that she might have a rotator cuff tear.  I am going to inject the subacromial space with Depo-Medrol and Xylocaine and monitor response.  If no improvement over the next 3 to 4 weeks would suggest an MRI scan and she will call  Follow-Up Instructions: Return if symptoms worsen or fail to improve.   Orders:  Orders Placed This Encounter  Procedures   XR Shoulder Right   No orders of the defined types were placed in this encounter.     Procedures: Large Joint Inj: R subacromial bursa on 11/02/2021 4:29 PM Indications: pain and diagnostic evaluation Details: 25 G 1.5 in needle, anterolateral approach  Arthrogram: No  Medications: 2 mL lidocaine 2 %; 60 mg methylPREDNISolone acetate 40 MG/ML; 2 mL bupivacaine 0.25 % Consent was given by the patient. Immediately prior to procedure a time out was called to verify the correct patient, procedure, equipment, support staff and site/side marked as required. Patient was prepped and draped in the  usual sterile fashion.      Clinical Data: No additional findings.   Subjective: Chief Complaint  Patient presents with   Right Shoulder - Pain  Patient presents today for right shoulder pain. She said that it has bothered her for awhile now. She was recently driving home in a storm one week ago and was very tense. After getting home she developed more pain in her right shoulder. She has limited range of motion due to pain. She is right hand dominant. She has taken over the counter pain medicine.   HPI  Review of Systems   Objective: Vital Signs: There were no vitals taken for this visit.  Physical Exam Constitutional:      Appearance: She is well-developed.  Pulmonary:     Effort: Pulmonary effort is normal.  Skin:    General: Skin is warm and dry.  Neurological:     Mental Status: She is alert and oriented to person, place, and time.  Psychiatric:        Behavior: Behavior normal.     Ortho Exam awake alert and oriented x3.  Comfortable sitting.  No acute distress to place right arm overhead but with a circuitous arc of motion.  Positive impingement and empty can testing.  Some pain along the anterior subacromial region but without crepitation.  No pain at  the [facility] joint.  Negative speeds sign.  Biceps appears to be intact although she does have large arms.  Good grip and release  Specialty Comments:  MRI LUMBAR SPINE WITHOUT CONTRAST   TECHNIQUE: Multiplanar, multisequence MR imaging of the lumbar spine was performed. No intravenous contrast was administered.   COMPARISON:  Radiographs dated February 21, 2021   FINDINGS: Segmentation:  Standard.   Alignment:  4 mm anterolisthesis of L4.   Vertebrae: No fracture, evidence of discitis, or bone lesion. Likely small hemangioma in T12 vertebral body.   Conus medullaris and cauda equina: Conus extends to the L1 level. Conus and cauda equina appear normal.   Paraspinal and other soft tissues: Nonspecific  subcutaneous soft tissue edema.   Disc spaces:   T12-L1: No significant disc bulge. No neural foraminal stenosis. No central canal stenosis. Mild facet joint arthropathy   L1-L2: No significant disc bulge. Mild ligamentum flavum hypertrophy. No neural foraminal stenosis. No central canal stenosis. Mild facet joint arthropathy Jill Dean.   L2-L3: Mild circumferential disc protrusion and ligamentum flavum hypertrophy with narrowing of spinal canal. Mild facet joint arthropathy. No significant neural foraminal narrowing.   L3-L4: Circumferential disc protrusion and ligamentum flavum hypertrophy with mild-to-moderate narrowing of spinal canal. Mild narrowing of bilateral lateral recesses. Moderate facet joint arthropathy. No significant neural foraminal narrowing.   L4-L5: Moderate circumferential disc protrusion and ligamentum flavum hypertrophy with narrowing of spinal canal. Narrowing of lateral recesses bilaterally with encroachment of the L5 nerve roots bilaterally. Moderate facet joint arthropathy, left worse than the right. No significant neural foraminal narrowing.   L5-S1: Moderate disc height loss with circumferential disc protrusion and subarticular narrowing bilaterally. Encroachment of the bilateral S1 nerve roots. Moderate facet joint arthropathy. Mild bilateral neural foraminal narrowing at L5.   IMPRESSION: 1.  No evidence of acute fracture or subluxation.   2. 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     Electronically Signed   By: Keane Police D.O.   On: 03/06/2021 14:42  Imaging: XR Shoulder Right  Result Date: 11/02/2021 Films of the right shoulder obtained in 3 projections.  There may be some very minimal degenerative changes the glenohumeral joint with some irregularity of the humeral head surface and small subchondral cyst.  Possibly very small osteophyte and if humeral head on 1 view.  Humeral head is centered  about the glenoid.  Normal space between the humeral head and the acromion.  Positive AC joint degenerative change with a type II acromion.  No ectopic calcification or acute changes    PMFS History: Patient Active Problem List   Diagnosis Date Noted   Impingement syndrome of right shoulder 11/02/2021   Hoarseness 07/31/2021   Rash and nonspecific skin eruption 11/07/2020   Type 2 diabetes mellitus with hyperglycemia, without long-term current use of insulin ([place]) 10/15/2020   Hypertriglyceridemia 04/18/2019   Closed nondisplaced fracture of proximal phalanx of lesser toe of right foot 06/05/2017   Non-suppurative otitis media 05/19/2015   PSVT (paroxysmal supraventricular tachycardia) 10/29/2013   Diabetes mellitus with coincident hypertension ([place]) 10/29/2013   Type 2 diabetes mellitus without complication ([place]) 14/43/1540   History of tachycardia 10/22/2013   RBBB (right bundle branch block) 10/22/2013   Drug effect 10/22/2013   Chest pain 10/16/2013   Palpitations 10/16/2013   DM (diabetes mellitus) ([place])    Obesity    HLD (hyperlipidemia)    SVT (supraventricular tachycardia) 10/15/2013   Hypothyroidism    Endometriosis  RHINOSINUSITIS, ACUTE 02/08/2009   GERD 02/11/2007   Raynaud's syndrome 02/07/2007   Seasonal and perennial allergic rhinitis 02/07/2007   URTICARIA 02/07/2007   OSTEOARTHRITIS 02/07/2007   Past Medical History:  Diagnosis Date   Chest pain    a. s/p normal ett nuclear stress test on 74/82/70   Complication of anesthesia    COVID-19 12/2019   Diabetes mellitus without complication (HCC)    Dysrhythmia    fast heart rate   GERD (gastroesophageal reflux disease)    HLD (hyperlipidemia)    Hypothyroidism    Obesity    Osteoarthrosis, unspecified whether generalized or localized, unspecified site    Other chronic allergic conjunctivitis    PONV (postoperative nausea and vomiting)    Raynaud's syndrome    Urticaria, unspecified     Family  History  Problem Relation Age of Onset   Lung cancer Father    Heart disease Father    Heart failure Mother    Dementia Mother    Heart disease Maternal Uncle    Lung cancer Paternal Uncle    COPD Paternal Uncle     Past Surgical History:  Procedure Laterality Date   CHOLECYSTECTOMY     CYST EXCISION Left 11/10/2014   Procedure: EXCISION CYST LEFT INDEX FINGER AND CYST LEFT SMALL FINGER ;  Surgeon: [name], MD;  Location: [facility];  Service: Orthopedics;  Laterality: Left;   ESOPHAGOGASTRODUODENOSCOPY N/A 01/05/2014   Procedure: ESOPHAGOGASTRODUODENOSCOPY (EGD);  Surgeon: [name], MD;  Location: [facility];  Service: Endoscopy;  Laterality: N/A;   knot right breast     LEFT HEART CATHETERIZATION WITH CORONARY ANGIOGRAM N/A 10/22/2013   Procedure: LEFT HEART CATHETERIZATION WITH CORONARY ANGIOGRAM;  Surgeon: [name], MD;  Location: [facility];  Service: Cardiovascular;  Laterality: N/A;   OOPHORECTOMY     LSO 84-RSO 04   thyroid nodule     TONSILLECTOMY     VAGINAL HYSTERECTOMY  2004   LAVH RSO endometriosis   VESICOVAGINAL FISTULA CLOSURE W/ TAH     Social History   Occupational History   Occupation: Beautician  Tobacco Use   Smoking status: Never   Smokeless tobacco: Never  Vaping Use   Vaping Use: Never used  Substance and Sexual Activity   Alcohol use: Yes    Alcohol/week: 0.0 standard drinks of alcohol    Comment: RARE   Drug use: No   Sexual activity: Not Currently    Birth control/protection: Surgical    Comment: INTERCOURSE AGE UNKOWN , SEXUAL PARTNERS LESS THAN 5

## 2021-11-08 DIAGNOSIS — R09A2 Foreign body sensation, throat: Secondary | ICD-10-CM | POA: Diagnosis not present

## 2021-11-11 ENCOUNTER — Telehealth: Payer: Self-pay | Admitting: Physical Medicine and Rehabilitation

## 2021-11-11 NOTE — Telephone Encounter (Signed)
Pt called requesting an appt for back injection with Dr Ernestina Patches. Please call pt at [phone].

## 2021-11-17 ENCOUNTER — Other Ambulatory Visit: Payer: Self-pay | Admitting: Physical Medicine and Rehabilitation

## 2021-11-17 DIAGNOSIS — M47816 Spondylosis without myelopathy or radiculopathy, lumbar region: Secondary | ICD-10-CM

## 2021-11-17 NOTE — Telephone Encounter (Signed)
Talked with patient. She stated received good relief from injection done in May but has had increased pain for several days. Advised will work on obtaining [place] for repeat injection since symptoms returning are same as previous and no new injury has occurred.

## 2021-11-22 NOTE — Telephone Encounter (Signed)
IC appt scheduled.  

## 2021-11-22 NOTE — Telephone Encounter (Signed)
Pt returning call... Pt stated that she spoke with someone about injection.... Pt requesting update on scheduling appt... Advised pt that the office is waiting on Auth... Pt requesting callback.Marland KitchenMarland Kitchen

## 2021-11-23 MED ORDER — BUPIVACAINE HCL 0.25 % IJ SOLN
2.0000 mL | INTRAMUSCULAR | Status: AC | PRN
  Administered 2021-11-02: 2 mL via INTRA_ARTICULAR

## 2021-11-23 MED ORDER — LIDOCAINE HCL 2 % IJ SOLN
2.0000 mL | INTRAMUSCULAR | Status: AC | PRN
  Administered 2021-11-02: 2 mL

## 2021-11-23 MED ORDER — METHYLPREDNISOLONE ACETATE 40 MG/ML IJ SUSP
60.0000 mg | INTRAMUSCULAR | Status: AC | PRN
  Administered 2021-11-02: 60 mg via INTRA_ARTICULAR

## 2021-11-29 ENCOUNTER — Telehealth: Payer: Self-pay | Admitting: Orthopaedic Surgery

## 2021-11-29 ENCOUNTER — Ambulatory Visit: Payer: Self-pay

## 2021-11-29 ENCOUNTER — Ambulatory Visit: Payer: PPO | Admitting: Physical Medicine and Rehabilitation

## 2021-11-29 VITALS — BP 157/79 | HR 90

## 2021-11-29 DIAGNOSIS — M47816 Spondylosis without myelopathy or radiculopathy, lumbar region: Secondary | ICD-10-CM | POA: Diagnosis not present

## 2021-11-29 MED ORDER — METHYLPREDNISOLONE ACETATE 80 MG/ML IJ SUSP
80.0000 mg | Freq: Once | INTRAMUSCULAR | Status: AC
  Administered 2021-11-29: 80 mg

## 2021-11-29 NOTE — Progress Notes (Signed)
Numeric Pain Rating Scale and Functional Assessment Average Pain 0   In the last MONTH (on 0-10 scale) has pain interfered with the following?  1. General activity like being  able to carry out your everyday physical activities such as walking, climbing stairs, carrying groceries, or moving a chair?  Rating(8)   +Driver, -BT, -Dye Allergies.  Standing too long makes pain worse. Sitting to long makes back stiff. No radiation into legs. Takes Motrin and Tylenol for pain

## 2021-11-29 NOTE — Telephone Encounter (Signed)
Patient was here today. She would like a RX for walk in shower/tub. Her call back number is [phone]. Says you can mail it to her.

## 2021-11-29 NOTE — Patient Instructions (Signed)

## 2021-12-03 ENCOUNTER — Other Ambulatory Visit: Payer: Self-pay | Admitting: Cardiology

## 2021-12-05 ENCOUNTER — Encounter: Payer: Self-pay | Admitting: Internal Medicine

## 2021-12-05 ENCOUNTER — Ambulatory Visit: Payer: PPO | Admitting: Internal Medicine

## 2021-12-05 VITALS — BP 110/70 | Ht 63.0 in | Wt 234.4 lb

## 2021-12-05 DIAGNOSIS — E039 Hypothyroidism, unspecified: Secondary | ICD-10-CM

## 2021-12-05 DIAGNOSIS — N760 Acute vaginitis: Secondary | ICD-10-CM

## 2021-12-05 DIAGNOSIS — E119 Type 2 diabetes mellitus without complications: Secondary | ICD-10-CM | POA: Diagnosis not present

## 2021-12-05 DIAGNOSIS — E1165 Type 2 diabetes mellitus with hyperglycemia: Secondary | ICD-10-CM

## 2021-12-05 LAB — POCT GLYCOSYLATED HEMOGLOBIN (HGB A1C): Hemoglobin A1C: 7.1 % — AB (ref 4.0–5.6)

## 2021-12-05 MED ORDER — FLUCONAZOLE 150 MG PO TABS: 150.0000 mg | ORAL_TABLET | Freq: Once | ORAL | 0 refills | Status: AC

## 2021-12-05 MED ORDER — FREESTYLE LIBRE 2 SENSOR MISC: 1.0000 | 3 refills | Status: DC

## 2021-12-05 MED ORDER — GLIMEPIRIDE 4 MG PO TABS: 4.0000 mg | ORAL_TABLET | Freq: Every day | ORAL | 3 refills | Status: DC

## 2021-12-05 MED ORDER — FREESTYLE LITE TEST VI STRP: 1.0000 | ORAL_STRIP | Freq: Every day | 3 refills | Status: DC

## 2021-12-05 NOTE — Patient Instructions (Signed)
-   Continue Glimepiride to 4 mg, before breakfast      HOW TO TREAT LOW BLOOD SUGARS (Blood sugar LESS THAN 70 MG/DL) Please follow the RULE OF 15 for the treatment of hypoglycemia treatment (when your (blood sugars are less than 70 mg/dL)   STEP 1: Take 15 grams of carbohydrates when your blood sugar is low, which includes:  3-4 GLUCOSE TABS  OR 3-4 OZ OF JUICE OR REGULAR SODA OR ONE TUBE OF GLUCOSE GEL    STEP 2: RECHECK blood sugar in 15 MINUTES STEP 3: If your blood sugar is still low at the 15 minute recheck --> then, go back to STEP 1 and treat AGAIN with another 15 grams of carbohydrates.

## 2021-12-05 NOTE — Progress Notes (Signed)
Name: Jill Dean  Age/ Sex: 76 y.o., female   MRN/ DOB: [id number], 1945-03-16     PCP: Leeroy Cha, MD   Reason for Endocrinology Evaluation: Type 2 Diabetes Mellitus  Initial Endocrine Consultative Visit: 04/18/2019    PATIENT IDENTIFIER: Jill Dean is a 76 y.o. female with a past medical history of T2DM, Hypothyroidism and Dyslipidemia. The patient has followed with Endocrinology clinic since 04/18/2019 for consultative assistance with management of her diabetes.  DIABETIC HISTORY:  [name] was diagnosed with DM in 2013. Metformin - GI issues, Jardiance - recurrent yeast infection, Glipizide- Hives . Her hemoglobin A1c has ranged from 7.0% in 2015, peaking at 7.8% in 2020.  In her initial visit to our clinic she had an A1c of 7.0%, we stopped Jardiance due to recurrent yeast infections. She is allergic due to Glipizide  Stop Rybelsus March 2023 due to pruritus, we attempted again later in 2023 with similar results  SUBJECTIVE:   During the last visit (07/18/2021): A1c 7.4%   Today (12/05/2021): [name] is here for a follow up on diabetes management.  She checks her blood sugars 2 times daily. The patient has not had hypoglycemic episodes since the last clinic visit.  She had a recent fiberoptic exam of the parents and the larynx 09/08/2021, increasing PPI was recommended by ENT Follows with Dr. Annamaria Boots for hoarseness   Today she is c/o genital pain and irritation with pruritus . Denies dysuria   Received back intra-articular injections  Denies nausea vomiting       HOME DIABETES REGIMEN:  Glimepiride 4 mg, 1 tab daily       Statin: Intolerant ACE-I/ARB: no    CONTINUOUS GLUCOSE MONITORING RECORD INTERPRETATION    Dates of Recording: 11/21 - 12/05/2021  Sensor description: [employer] 2  Results statistics:   CGM use % of time 95  Average and SD 135/23.8  Time in range     90   %  % Time Above 180 9  % Time above 250 0  % Time Below  target 1   Glycemic patterns summary: Optimal BG's overnight and during the day except suppertime  Hyperglycemic episodes postprandial  Hypoglycemic episodes occurred during the day  Overnight periods: Optimal    DIABETIC COMPLICATIONS: Microvascular complications:    Denies: CKD, retinopathy , neuropathy  Last eye exam: Completed 07/18/2021   Macrovascular complications:    Denies: CAD, PVD, CVA     HISTORY:  Past Medical History:  Past Medical History:  Diagnosis Date   Chest pain    a. s/p normal ett nuclear stress test on 81/01/75   Complication of anesthesia    COVID-19 12/2019   Diabetes mellitus without complication (HCC)    Dysrhythmia    fast heart rate   GERD (gastroesophageal reflux disease)    HLD (hyperlipidemia)    Hypothyroidism    Obesity    Osteoarthrosis, unspecified whether generalized or localized, unspecified site    Other chronic allergic conjunctivitis    PONV (postoperative nausea and vomiting)    Raynaud's syndrome    Urticaria, unspecified    Past Surgical History:  Past Surgical History:  Procedure Laterality Date   CHOLECYSTECTOMY     CYST EXCISION Left 11/10/2014   Procedure: EXCISION CYST LEFT INDEX FINGER AND CYST LEFT SMALL FINGER ;  Surgeon: [name], MD;  Location: [facility];  Service: Orthopedics;  Laterality: Left;   ESOPHAGOGASTRODUODENOSCOPY N/A 01/05/2014   Procedure: ESOPHAGOGASTRODUODENOSCOPY (EGD);  Surgeon: [name], MD;  Location: [facility];  Service: Endoscopy;  Laterality: N/A;   knot right breast     LEFT HEART CATHETERIZATION WITH CORONARY ANGIOGRAM N/A 10/22/2013   Procedure: LEFT HEART CATHETERIZATION WITH CORONARY ANGIOGRAM;  Surgeon: [name], MD;  Location: [facility];  Service: Cardiovascular;  Laterality: N/A;   OOPHORECTOMY     LSO 84-RSO 04   thyroid nodule     TONSILLECTOMY     VAGINAL HYSTERECTOMY  2004   LAVH RSO endometriosis   VESICOVAGINAL FISTULA CLOSURE W/  TAH     Social History:  reports that she has never smoked. She has never used smokeless tobacco. She reports current alcohol use. She reports that she does not use drugs. Family History:  Family History  Problem Relation Age of Onset   Lung cancer Father    Heart disease Father    Heart failure Mother    Dementia Mother    Heart disease Maternal Uncle    Lung cancer Paternal Uncle    COPD Paternal Uncle      HOME MEDICATIONS: Allergies as of 12/05/2021       Reactions   Hydrocod Poli-chlorphe Poli Er Other (See Comments)   hyperactivity   Pseudoephedrine Itching, Palpitations, Other (See Comments)   REACTION: tachycardia   Sulfonamide Derivatives Anaphylaxis, Swelling   Azithromycin Itching   Buprenorphine Hcl Itching   Codeine Itching   Dilaudid [hydromorphone Hcl] Itching   Macrodantin [nitrofurantoin] Itching   Hands and feet itching. "Trouble breathing" w/lump in throat and stuffy nose.   Morphine And Related Itching   Shrimp [shellfish Allergy] Itching   Simvastatin Other (See Comments)   Fatigue, depression and dizziness at 20 mg   Atorvastatin    Muscle cramps  And her blood sugar kept going up   Cephalosporins Swelling   Said to cause facial swelling   Covid-19 (adenovirus) Vaccine    Other reaction(s): rash, itching, skin tight   Empagliflozin    Other reaction(s): yeast infection   Erythromycin Base Itching, Swelling   Hydrocod Poli-chlorphe Poli Er    Other reaction(s): flushing and facial edema   Hydrocodone-acetaminophen Other (See Comments)   Redness of face, swelling of tolerates tylenol   Metformin Hcl    Other reaction(s): epigastric pain   Metronidazole    Other reaction(s): tonsil swelling   Other    Rybelsus---anaphylaxsis   Penicillinase Itching   Sulfamethoxazole Itching   Clarithromycin Itching   REACTION: severe itching        Medication List        Accurate as of December 05, 2021  1:04 PM. If you have any questions, ask your  nurse or doctor.          STOP taking these medications    FreeStyle Libre 2 Reader Devi       TAKE these medications    amitriptyline 25 MG tablet Commonly known as: ELAVIL Take 25 mg by mouth at bedtime.   calcium-vitamin D 500-200 MG-UNIT tablet Commonly known as: OSCAL WITH D Take 1 tablet by mouth daily with breakfast.   cetirizine 10 MG tablet Commonly known as: ZYRTEC Take 10 mg by mouth daily.   EPINEPHrine 0.3 mg/0.3 mL Soaj injection Commonly known as: EPI-PEN Inject 0.3 mg into the muscle as needed for anaphylaxis.   estradiol 0.1 MG/GM vaginal cream Commonly known as: ESTRACE VAGINAL Place 1 Applicatorful vaginally 3 (three) times a week.   fluconazole 150 MG tablet Commonly known  as: DIFLUCAN Take 1 tablet (150 mg total) by mouth once for 1 dose. What changed: when to take this   fluticasone 50 MCG/ACT nasal spray Commonly known as: FLONASE Place into both nostrils daily.   FreeStyle Libre 2 Sensor Misc 1 Device by Does not apply route every 14 (fourteen) days.   FREESTYLE LITE test strip Generic drug: glucose blood 1 each by Other route daily in the afternoon. Use as instructed What changed:  how much to take how to take this when to take this additional instructions   glimepiride 4 MG tablet Commonly known as: AMARYL Take 1 tablet (4 mg total) by mouth daily before breakfast.   ibuprofen 200 MG tablet Commonly known as: ADVIL Take 400 mg by mouth at bedtime as needed for moderate pain or mild pain.   levothyroxine 100 MCG tablet Commonly known as: SYNTHROID Take 100 mcg by mouth daily.   Lutein 20 MG Caps Take 1 capsule by mouth daily.   Magnesium 400 MG Caps Take 1 tablet by mouth daily.   methocarbamol 750 MG tablet Commonly known as: ROBAXIN Take 1 tablet (750 mg total) by mouth every 8 (eight) hours as needed for muscle spasms.   metoprolol succinate 50 MG 24 hr tablet Commonly known as: TOPROL-XL TAKE 1 TABLET BY  MOUTH EVERY DAY WITH OR IMMEDIATELY FOLLOWING A MEAL   omeprazole 20 MG capsule Commonly known as: PRILOSEC Take 1 capsule (20 mg total) by mouth 2 (two) times daily before a meal.   omeprazole 40 MG capsule Commonly known as: PRILOSEC Take by mouth.   traMADol 50 MG tablet Commonly known as: ULTRAM Take 1 tablet (50 mg total) by mouth every 6 (six) hours as needed.         OBJECTIVE:   Vital Signs: BP 110/70   Ht '5\' 3"'$  (1.6 m)   Wt 234 lb 6.4 oz (106.3 kg)   BMI 41.52 kg/m   Wt Readings from Last 3 Encounters:  12/05/21 234 lb 6.4 oz (106.3 kg)  07/18/21 234 lb (106.1 kg)  06/20/21 233 lb 9.6 oz (106 kg)     Exam: General: Pt appears well and is in NAD  Lungs: Clear with good BS bilat   Heart: RRR   Extremities: No pretibial edema.   Neuro: MS is good with appropriate affect, pt is alert and Ox3   DM foot exam:07/18/2021   The skin of the feet without sores or ulcerations. Plantar callous formation noted bilaterally  The pedal pulses are 2+ on right and 2+ on left. The sensation is intact to a screening 5.07, 10 gram monofilament bilaterally     DATA REVIEWED:  Lab Results  Component Value Date   HGBA1C 7.1 (A) 12/05/2021   HGBA1C 6.9 (A) 01/17/2021   HGBA1C 8.1 (A) 10/14/2020    Latest Reference Range & Units 07/18/21 11:40  TSH 0.35 - 5.50 uIU/mL 3.13     06/03/2021 A1c 7.4%    03/08/2021 BUN 17 Cr . 0.860 GFR 70 lDL 138   ASSESSMENT / PLAN / RECOMMENDATIONS:   1) Type 2 Diabetes Mellitus, Optimally  controlled, Without complications - Most recent A1c of 7.1  %. Goal A1c < 7.0 %.    Patient is intolerant to Jardiance due to recurrent vaginal yeast infections, and intolerant to Metformin as well as Glipizide - rash  Intolerant to Rybelsus-rash  She is doing well with freestyle libre, extra strips compatible with freestyle libre   MEDICATIONS:  - Continue  Glimepiride  4 mg daily   EDUCATION / INSTRUCTIONS: BG monitoring instructions:  Patient is instructed to check her blood sugars 1 times a day, fasting . Call [facility] clinic if: BG persistently < 70      2) Diabetic complications:  Eye: Does not  have known diabetic retinopathy.  Neuro/ Feet: Does not have known diabetic peripheral neuropathy .  Renal: Patient does not have known baseline CKD. She   is not on an ACEI/ARB at present.   3) Hypothyroidism:   - TSH is normal  - Continue Synthroid 100 mcg daily    4) Acute vaginitis  -A prescription of fluconazole 150 MG x 1 dose sent to the pharmacy    F/U in 4 months    Signed electronically by: Mack Guise, MD  [facility]  [facility] [address] [place], [place] [address] Phone: [phone] FAX: [phone]   CC: Leeroy Cha, MD [address] [place] [address] Phone: [phone]  Fax: [phone]  Return to Endocrinology clinic as below: Future Appointments  Date Time Provider [place]  12/07/2021  9:20 AM Jerline Pain, MD [facility]  06/05/2022 11:10 AM Maddi Collar, Melanie Crazier, MD [facility] None  06/22/2022 11:00 AM Deneise Lever, MD [facility] None

## 2021-12-07 ENCOUNTER — Ambulatory Visit: Payer: PPO | Attending: Cardiology | Admitting: Cardiology

## 2021-12-07 ENCOUNTER — Encounter: Payer: Self-pay | Admitting: Cardiology

## 2021-12-07 VITALS — BP 120/70 | HR 84 | Ht 63.0 in | Wt 234.0 lb

## 2021-12-07 DIAGNOSIS — I7300 Raynaud's syndrome without gangrene: Secondary | ICD-10-CM

## 2021-12-07 DIAGNOSIS — E782 Mixed hyperlipidemia: Secondary | ICD-10-CM | POA: Diagnosis not present

## 2021-12-07 DIAGNOSIS — I471 Supraventricular tachycardia, unspecified: Secondary | ICD-10-CM

## 2021-12-07 DIAGNOSIS — E119 Type 2 diabetes mellitus without complications: Secondary | ICD-10-CM

## 2021-12-07 DIAGNOSIS — I10 Essential (primary) hypertension: Secondary | ICD-10-CM

## 2021-12-07 LAB — LIPID PANEL
Chol/HDL Ratio: 3.7 ratio (ref 0.0–4.4)
Cholesterol, Total: 268 mg/dL — ABNORMAL HIGH (ref 100–199)
HDL: 73 mg/dL (ref >39)
LDL Chol Calc (NIH): 144 mg/dL — ABNORMAL HIGH (ref 0–99)
Triglycerides: 287 mg/dL — ABNORMAL HIGH (ref 0–149)
VLDL Cholesterol Cal: 51 mg/dL — ABNORMAL HIGH (ref 5–40)

## 2021-12-07 NOTE — Patient Instructions (Signed)
Medication Instructions:  Your physician recommends that you continue on your current medications as directed. Please refer to the Current Medication list given to you today.  *If you need a refill on your cardiac medications before your next appointment, please call your pharmacy*  Lab Work: Your physician recommends that you have lab work today- Lipid panel  If you have labs (blood work) drawn today and your tests are completely normal, you will receive your results only by: MyChart Message (if you have MyChart) OR A paper copy in the mail If you have any lab test that is abnormal or we need to change your treatment, we will call you to review the results.  Testing/Procedures: None ordered today.  Follow-Up: At [facility], you and your health needs are our priority.  As part of our continuing mission to provide you with exceptional heart care, we have created designated Provider Care Teams.  These Care Teams include your primary Cardiologist (physician) and Advanced Practice Providers (APPs -  Physician Assistants and Nurse Practitioners) who all work together to provide you with the care you need, when you need it.  We recommend signing up for the patient portal called "MyChart".  Sign up information is provided on this After Visit Summary.  MyChart is used to connect with patients for Virtual Visits (Telemedicine).  Patients are able to view lab/test results, encounter notes, upcoming appointments, etc.  Non-urgent messages can be sent to your provider as well.   To learn more about what you can do with MyChart, go to [email or web address].    Your next appointment:   1 year(s)  The format for your next appointment:   In Person  Provider:   Candee Furbish, MD       Important Information About Sugar

## 2021-12-07 NOTE — Progress Notes (Signed)
Cardiology Office Note:    Date:  12/07/2021   ID:  Jill Dean, DOB 30-Mar-1945, MRN [id number]  PCP:  Leeroy Cha, MD  [facility] Cardiologist:  Candee Furbish, MD  [facility] Electrophysiologist:  None   Referring MD: Leeroy Cha,*     History of Present Illness:    Jill Dean is a 76 y.o. female is here for a follow-up for hypertension and hyperlipidemia and SVT, 2015.   Presumed supraventricular tachycardia of 200 bpm per EMS on monitor, no strips, that resolved with vagal maneuvers in early October 2015. She was placed on diltiazem but felt poorly on this medication and this was stopped. Started Toprol-XL 25 mg once a day. Maybe had 1 min episode of PSVT, Valsalva maneuver still worked.She also had another episode of PSVT transiently when being tired/exhausted. She did have leg cramps and calf cramps at night.When was on Atorvastatin she stopped after using for 6 days. Her blood sugar was increased and had worsened cramps.    Had cardiac catheterization which was unremarkable. Echocardiogram was reassured. Treadmill test was reassured. Did not wanted to take statins.   She was excited about her daughter, she is currently a grandmother.  06/28/2017-since my last visit, she was seen [name] on 11/22/2016.  Continue with watchful waiting.  Beta-blocker.  Avoided alcohol caffeine.  She did daily exercise.  He did not think she had an atrial tachycardia.  30-day event monitor reviewed sinus rhythm and sinus tachycardia.  No SVT. She did however hit her foot on a metal table, broke her toe.  In a flat shoe support, Dr. Sharol Given.  06/10/2018-She stated that she had 1 brief episode of palpitations that lasted "1 second "the other morning and tolerated her Toprol well.    06/12/2019-was a  follow-up of tachycardia. 115 bpm and take an extra 25 metoprolol.  This seems to help.  She did have Covid back in December.  Most of her family did have this as well.  Fevers  for couple days. Mechanical valve.  Today, she reports she went to the ED 08/20/2020 after inputting a secondary new carpet in her home. She experiences a allergic reaction to where her throat started swelling and facial redness. She thought she was going to die and called her husband to inform him. She found to be allergic to many things since the visit.   She includes she takes a Benadryl to help with her allergies but was told to discontinue by Elmo Putt Gray,DO and take Zrytec which have not been helpful. After visit, she was prescribed Prednisone 20 mg, 4 times daily however effects her blood sugar to rise. She also states blood sugar is high in the mornings and she stopped eating at night to try to help.  Cataract surgery, 4 shots in back. Dr. Ernestina Patches. One time felt heart racing and stopped on it own after few seconds. No CP, SOB, no syncope. Right arm pain.      Past Medical History:  Diagnosis Date   Chest pain    a. s/p normal ett nuclear stress test on 50/53/97   Complication of anesthesia    COVID-19 12/2019   Diabetes mellitus without complication (HCC)    Dysrhythmia    fast heart rate   GERD (gastroesophageal reflux disease)    HLD (hyperlipidemia)    Hypothyroidism    Obesity    Osteoarthrosis, unspecified whether generalized or localized, unspecified site    Other chronic allergic conjunctivitis  PONV (postoperative nausea and vomiting)    Raynaud's syndrome    Urticaria, unspecified     Past Surgical History:  Procedure Laterality Date   CHOLECYSTECTOMY     CYST EXCISION Left 11/10/2014   Procedure: EXCISION CYST LEFT INDEX FINGER AND CYST LEFT SMALL FINGER ;  Surgeon: [name], MD;  Location: [facility];  Service: Orthopedics;  Laterality: Left;   ESOPHAGOGASTRODUODENOSCOPY N/A 01/05/2014   Procedure: ESOPHAGOGASTRODUODENOSCOPY (EGD);  Surgeon: [name], MD;  Location: [facility];  Service: Endoscopy;  Laterality: N/A;   knot right breast      LEFT HEART CATHETERIZATION WITH CORONARY ANGIOGRAM N/A 10/22/2013   Procedure: LEFT HEART CATHETERIZATION WITH CORONARY ANGIOGRAM;  Surgeon: [name], MD;  Location: [facility];  Service: Cardiovascular;  Laterality: N/A;   OOPHORECTOMY     LSO 84-RSO 04   thyroid nodule     TONSILLECTOMY     VAGINAL HYSTERECTOMY  2004   LAVH RSO endometriosis   VESICOVAGINAL FISTULA CLOSURE W/ TAH      Current Medications: Current Meds  Medication Sig   amitriptyline (ELAVIL) 25 MG tablet Take 25 mg by mouth at bedtime.   calcium-vitamin D (OSCAL WITH D) 500-200 MG-UNIT tablet Take 1 tablet by mouth daily with breakfast.    cetirizine (ZYRTEC) 10 MG tablet Take 10 mg by mouth daily.   Continuous Blood Gluc Sensor (FREESTYLE LIBRE 2 SENSOR) MISC 1 Device by Does not apply route every 14 (fourteen) days.   EPINEPHrine 0.3 mg/0.3 mL IJ SOAJ injection Inject 0.3 mg into the muscle as needed for anaphylaxis.   fluticasone (FLONASE) 50 MCG/ACT nasal spray Place into both nostrils daily.   glimepiride (AMARYL) 4 MG tablet Take 1 tablet (4 mg total) by mouth daily before breakfast.   glucose blood (FREESTYLE LITE) test strip 1 each by Other route daily in the afternoon. Use as instructed   ibuprofen (ADVIL) 200 MG tablet Take 400 mg by mouth at bedtime as needed for moderate pain or mild pain.   levothyroxine (SYNTHROID, LEVOTHROID) 100 MCG tablet Take 100 mcg by mouth daily.   Lutein 20 MG CAPS Take 1 capsule by mouth daily.   Magnesium 400 MG CAPS Take 1 tablet by mouth daily.   metoprolol succinate (TOPROL-XL) 50 MG 24 hr tablet TAKE 1 TABLET BY MOUTH EVERY DAY WITH OR IMMEDIATELY FOLLOWING A MEAL   omeprazole (PRILOSEC) 40 MG capsule Take by mouth.   traMADol (ULTRAM) 50 MG tablet Take 1 tablet (50 mg total) by mouth every 6 (six) hours as needed.   Current Facility-Administered Medications for the 12/07/21 encounter (Office Visit) with Jerline Pain, MD  Medication   methylPREDNISolone  acetate (DEPO-MEDROL) injection 80 mg    Allergies:   Hydrocod poli-chlorphe poli er, Pseudoephedrine, Sulfonamide derivatives, Azithromycin, Buprenorphine hcl, Codeine, Dilaudid [hydromorphone hcl], Macrodantin [nitrofurantoin], Morphine and related, Shrimp [shellfish allergy], Simvastatin, Atorvastatin, Cephalosporins, Covid-19 (adenovirus) vaccine, Empagliflozin, Erythromycin base, Hydrocod poli-chlorphe poli er, Hydrocodone-acetaminophen, Metformin hcl, Metronidazole, Other, Penicillinase, Sulfamethoxazole, and Clarithromycin   Social History   Socioeconomic History   Marital status: Married    Spouse name: Not on file   Number of children: Not on file   Years of education: Not on file   Highest education level: Not on file  Occupational History   Occupation: Beautician  Tobacco Use   Smoking status: Never   Smokeless tobacco: Never  Vaping Use   Vaping Use: Never used  Substance and Sexual Activity   Alcohol  use: Yes    Alcohol/week: 0.0 standard drinks of alcohol    Comment: RARE   Drug use: No   Sexual activity: Not Currently    Birth control/protection: Surgical    Comment: INTERCOURSE AGE UNKOWN , SEXUAL PARTNERS LESS THAN 5  Other Topics Concern   Not on file  Social History Narrative   Not on file   Social Determinants of Health   Financial Resource Strain: Not on file  Food Insecurity: Not on file  Transportation Needs: Not on file  Physical Activity: Not on file  Stress: Not on file  Social Connections: Not on file     Family History: The patient's family history includes COPD in her paternal uncle; Dementia in her mother; Heart disease in her father and maternal uncle; Heart failure in her mother; Lung cancer in her father and paternal uncle.  ROS:   Please see the history of present illness.    (+)facial redness (+) throat swelling  (+) allergic reactions  All other systems reviewed and are negative.  EKGs/Labs/Other Studies Reviewed:    The  following studies were reviewed today:  Segmental Exercise 05/2020:  FINDINGS: Right Lower Extremity Resting ABI:  1.22 Resting TBI: 0.69 Post Exercise ABI:1.08 Segmental Pressures: Normal segmental pressures, no significant (20 mmHg) pressure gradient between adjacent segments. Great toe pressure: 88 Arterial Waveforms: Normal tri-phasic arterial waveforms. PVRs: Normal PVRs with maintained waveform amplitude, augmentation and quality.  Left Lower Extremity: Resting ABI: 1.23 Resting TBI: 0.87 Post Exercise ABI:1.01 Segmental Pressures: Normal segmental pressures, no significant (20 mmHg) pressure gradient between adjacent segments. Great toe pressure: 111 Arterial Waveforms: Normal tri-phasic arterial waveforms. PVRs: Normal PVRs with maintained waveform amplitude, augmentation and quality.   Other: Symmetric upper extremity pressures.   IMPRESSION: Normal pre- and post-exercise ankle brachial indices. No evidence of flow-limiting stenosis in the bilateral lower extremities.   CARDIAC TELEMETRY 10/2016:  21% of recording was sinus tachycardia/atrial tachycardia at 110bpm. No atrial fibrillation. No rapid SVT Symptoms of fluttering correlate with sinus tachycardiac/atrial tachycardia 110bpm. Occasional PAC's (premature atrial contractions)  EKG: 12/07/2021-sinus rhythm PACs right bundle branch block left anterior fascicular block nonspecific ST-T wave changes no significant change from prior. 09/22: sinus rhythm, hr 92 bpm , RBBB.  06/2019: sinus rhythm right bundle branch block 93 bpm left anterior fascicular block  Recent Labs: 07/18/2021: TSH 3.13  Recent Lipid Panel    Component Value Date/Time   CHOL 223 (H) 06/21/2015 1007   TRIG 249 (H) 06/21/2015 1007   HDL 59 06/21/2015 1007   CHOLHDL 3.8 06/21/2015 1007   VLDL 50 (H) 06/21/2015 1007   LDLCALC 114 06/21/2015 1007    Physical Exam:    VS:  BP 120/70 (BP Location: Left Arm, Patient Position: Sitting,  Cuff Size: Normal)   Pulse 84   Ht '5\' 3"'$  (1.6 m)   Wt 234 lb (106.1 kg)   BMI 41.45 kg/m     Wt Readings from Last 3 Encounters:  12/07/21 234 lb (106.1 kg)  12/05/21 234 lb 6.4 oz (106.3 kg)  07/18/21 234 lb (106.1 kg)     GEN: Well nourished, well developed, in no acute distress HEENT: normal Neck: no JVD, carotid bruits, or masses Cardiac: RRR; rare ectopy no murmurs, rubs, or gallops,no edema  Respiratory:  clear to auscultation bilaterally, normal work of breathing GI: soft, nontender, nondistended, + BS MS: no deformity or atrophy Skin: warm and dry, no rash Neuro:  Alert and Oriented x 3, Strength and sensation  are intact Psych: euthymic mood, full affect   ASSESSMENT:    1. PSVT (paroxysmal supraventricular tachycardia)   2. Mixed hyperlipidemia   3. Diabetes mellitus with coincident hypertension ([place])   4. Raynaud's disease without gangrene      PLAN:       PSVT (paroxysmal supraventricular tachycardia) Doing well with Toprol, medical management.  Has metoprolol tartrate which works.   No changes made.  Interestingly her aunt Rod Holler had PSVT EP ablated her in [place] with no further recurrence.  Rarely has PVCs and PACs.  She has seen [name] in the past.   HLD (hyperlipidemia) In the past has had some cramping with medication.  Statin intolerance. Prior LDL 96.  Triglycerides have been in the 300 range in the past.  Continue with diet and exercise.  She would like to get her lipid panel checked today.  She is not very interested in taking any new medications for it.   Diabetes mellitus with coincident hypertension (HCC) Prior hemoglobin A1c 7.0.  Blood pressure currently under excellent control.  Medications reviewed, continue current medical management.  No changes made.   Raynaud's syndrome Continue to monitor.  Stable, doing well.   Seasonal and perennial allergic rhinitis Allergic reaction to for bathroom cleaning solution at home.  Throat  felt like it was closing up.  Urticaria.  Seen in the emergency department.  Had allergy testing diffuse.Allergic to several different compounds including rubber type additives.  Takes Zyrtec daily.  Has an EpiPen.    Signed, Candee Furbish, MD  12/07/2021 10:12 AM    [place]

## 2021-12-13 NOTE — Procedures (Signed)
Lumbar Facet Joint Intra-Articular Injection(s) with Fluoroscopic Guidance  Patient: Jill Dean      Date of Birth: February 03, 1945 MRN: [id number] PCP: Leeroy Cha, MD      Visit Date: 11/29/2021   Universal Protocol:    Date/Time: 11/29/2021  Consent Given By: the patient  Position: PRONE   Additional Comments: Vital signs were monitored before and after the procedure. Patient was prepped and draped in the usual sterile fashion. The correct patient, procedure, and site was verified.   Injection Procedure Details:  Procedure Site One Meds Administered:  Meds ordered this encounter  Medications   methylPREDNISolone acetate (DEPO-MEDROL) injection 80 mg     Laterality: Bilateral  Location/Site:  L4-L5  Needle size: 22 guage  Needle type: Spinal  Needle Placement: Articular  Findings:  -Comments: Excellent flow of contrast producing a partial arthrogram.  Procedure Details: The fluoroscope beam is vertically oriented in AP, and the inferior recess is visualized beneath the lower pole of the inferior apophyseal process, which represents the target point for needle insertion. When direct visualization is difficult the target point is located at the medial projection of the vertebral pedicle. The region overlying each aforementioned target is locally anesthetized with a 1 to 2 ml. volume of 1% Lidocaine without Epinephrine.   The spinal needle was inserted into each of the above mentioned facet joints using biplanar fluoroscopic guidance. A 0.25 to 0.5 ml. volume of Isovue-250 was injected and a partial facet joint arthrogram was obtained. A single spot film was obtained of the resulting arthrogram.    One to 1.25 ml of the steroid/anesthetic solution was then injected into each of the facet joints noted above.   Additional Comments:  The patient tolerated the procedure well Dressing: 2 x 2 sterile gauze and Band-Aid    Post-procedure details: Patient was  observed during the procedure. Post-procedure instructions were reviewed.  Patient left the clinic in stable condition.

## 2021-12-13 NOTE — Progress Notes (Signed)
Jill Dean - 76 y.o. female MRN [id number]  Date of birth: Oct 30, 1945  Office Visit Note: Visit Date: 11/29/2021 PCP: Leeroy Cha, MD Referred by: Leeroy Cha,*  Subjective: Chief Complaint  Patient presents with   Lower Back - Pain   HPI:  Jill Dean is a 76 y.o. female who comes in today at the request of Dr. Joni Fears for planned Bilateral  L4-5 Lumbar facet/medial branch block with fluoroscopic guidance.  The patient has failed conservative care including home exercise, medications, time and activity modification.  This injection will be diagnostic and hopefully therapeutic.  Please see requesting physician notes for further details and justification.  Exam has shown concordant pain with facet joint loading.   ROS Otherwise per HPI.  Assessment & Plan: Visit Diagnoses:    ICD-10-CM   1. Spondylosis without myelopathy or radiculopathy, lumbar region  M47.816 XR C-ARM NO REPORT    Facet Injection    methylPREDNISolone acetate (DEPO-MEDROL) injection 80 mg      Plan: No additional findings.   Meds & Orders:  Meds ordered this encounter  Medications   methylPREDNISolone acetate (DEPO-MEDROL) injection 80 mg    Orders Placed This Encounter  Procedures   Facet Injection   XR C-ARM NO REPORT    Follow-up: Return for visit to requesting provider as needed.   Procedures: No procedures performed  Lumbar Facet Joint Intra-Articular Injection(s) with Fluoroscopic Guidance  Patient: Jill Dean      Date of Birth: February 06, 1945 MRN: [id number] PCP: Leeroy Cha, MD      Visit Date: 11/29/2021   Universal Protocol:    Date/Time: 11/29/2021  Consent Given By: the patient  Position: PRONE   Additional Comments: Vital signs were monitored before and after the procedure. Patient was prepped and draped in the usual sterile fashion. The correct patient, procedure, and site was verified.   Injection Procedure Details:  Procedure Site  One Meds Administered:  Meds ordered this encounter  Medications   methylPREDNISolone acetate (DEPO-MEDROL) injection 80 mg     Laterality: Bilateral  Location/Site:  L4-L5  Needle size: 22 guage  Needle type: Spinal  Needle Placement: Articular  Findings:  -Comments: Excellent flow of contrast producing a partial arthrogram.  Procedure Details: The fluoroscope beam is vertically oriented in AP, and the inferior recess is visualized beneath the lower pole of the inferior apophyseal process, which represents the target point for needle insertion. When direct visualization is difficult the target point is located at the medial projection of the vertebral pedicle. The region overlying each aforementioned target is locally anesthetized with a 1 to 2 ml. volume of 1% Lidocaine without Epinephrine.   The spinal needle was inserted into each of the above mentioned facet joints using biplanar fluoroscopic guidance. A 0.25 to 0.5 ml. volume of Isovue-250 was injected and a partial facet joint arthrogram was obtained. A single spot film was obtained of the resulting arthrogram.    One to 1.25 ml of the steroid/anesthetic solution was then injected into each of the facet joints noted above.   Additional Comments:  The patient tolerated the procedure well Dressing: 2 x 2 sterile gauze and Band-Aid    Post-procedure details: Patient was observed during the procedure. Post-procedure instructions were reviewed.  Patient left the clinic in stable condition.    Clinical History: MRI LUMBAR SPINE WITHOUT CONTRAST   TECHNIQUE: Multiplanar, multisequence MR imaging of the lumbar spine was performed. No intravenous contrast was administered.   COMPARISON:  Radiographs dated February 21, 2021   FINDINGS: Segmentation:  Standard.   Alignment:  4 mm anterolisthesis of L4.   Vertebrae: No fracture, evidence of discitis, or bone lesion. Likely small hemangioma in T12 vertebral body.    Conus medullaris and cauda equina: Conus extends to the L1 level. Conus and cauda equina appear normal.   Paraspinal and other soft tissues: Nonspecific subcutaneous soft tissue edema.   Disc spaces:   T12-L1: No significant disc bulge. No neural foraminal stenosis. No central canal stenosis. Mild facet joint arthropathy   L1-L2: No significant disc bulge. Mild ligamentum flavum hypertrophy. No neural foraminal stenosis. No central canal stenosis. Mild facet joint arthropathy [name].   L2-L3: Mild circumferential disc protrusion and ligamentum flavum hypertrophy with narrowing of spinal canal. Mild facet joint arthropathy. No significant neural foraminal narrowing.   L3-L4: Circumferential disc protrusion and ligamentum flavum hypertrophy with mild-to-moderate narrowing of spinal canal. Mild narrowing of bilateral lateral recesses. Moderate facet joint arthropathy. No significant neural foraminal narrowing.   L4-L5: Moderate circumferential disc protrusion and ligamentum flavum hypertrophy with narrowing of spinal canal. Narrowing of lateral recesses bilaterally with encroachment of the L5 nerve roots bilaterally. Moderate facet joint arthropathy, left worse than the right. No significant neural foraminal narrowing.   L5-S1: Moderate disc height loss with circumferential disc protrusion and subarticular narrowing bilaterally. Encroachment of the bilateral S1 nerve roots. Moderate facet joint arthropathy. Mild bilateral neural foraminal narrowing at L5.   IMPRESSION: 1.  No evidence of acute fracture or subluxation.   2. 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     Electronically Signed   By: Keane Police D.O.   On: 03/06/2021 14:42     Objective:  VS:  HT:    WT:   BMI:     BP:(!) 157/79  HR:90bpm  TEMP: ( )  RESP:  Physical Exam Vitals and nursing note reviewed.  Constitutional:      General: She is not in acute  distress.    Appearance: Normal appearance. She is not ill-appearing.  HENT:     Head: Normocephalic and atraumatic.     Right Ear: External ear normal.     Left Ear: External ear normal.  Eyes:     Extraocular Movements: Extraocular movements intact.  Cardiovascular:     Rate and Rhythm: Normal rate.     Pulses: Normal pulses.  Pulmonary:     Effort: Pulmonary effort is normal. No respiratory distress.  Abdominal:     General: There is no distension.     Palpations: Abdomen is soft.  Musculoskeletal:        General: Tenderness present.     Cervical back: Neck supple.     Right lower leg: No edema.     Left lower leg: No edema.     Comments: Patient has good distal strength with no pain over the greater trochanters.  No clonus or focal weakness.  Skin:    Findings: No erythema, lesion or rash.  Neurological:     General: No focal deficit present.     Mental Status: She is alert and oriented to person, place, and time.     Sensory: No sensory deficit.     Motor: No weakness or abnormal muscle tone.     Coordination: Coordination normal.  Psychiatric:        Mood and Affect: Mood normal.        Behavior: Behavior normal.  Imaging: No results found.

## 2021-12-14 ENCOUNTER — Telehealth: Payer: Self-pay

## 2021-12-14 DIAGNOSIS — E782 Mixed hyperlipidemia: Secondary | ICD-10-CM

## 2021-12-14 NOTE — Telephone Encounter (Signed)
-----   Message from Michaelyn Barter, RN sent at 12/08/2021  5:39 PM EST -----  ----- Message ----- From: Jerline Pain, MD Sent: 12/07/2021   7:59 PM EST To: Michaelyn Barter, RN  High cholesterol.  Like we talked about in clinic, let's refer to lipid clinic to discuss options.  She is statin intolerant  Candee Furbish, MD

## 2021-12-14 NOTE — Telephone Encounter (Signed)
Will place order for lipid clinic.

## 2022-01-23 DIAGNOSIS — R0789 Other chest pain: Secondary | ICD-10-CM | POA: Diagnosis not present

## 2022-01-26 ENCOUNTER — Encounter: Payer: Self-pay | Admitting: Pharmacist

## 2022-01-26 ENCOUNTER — Ambulatory Visit: Payer: PPO | Attending: Internal Medicine | Admitting: Pharmacist

## 2022-01-26 DIAGNOSIS — E782 Mixed hyperlipidemia: Secondary | ICD-10-CM | POA: Diagnosis not present

## 2022-01-26 NOTE — Assessment & Plan Note (Signed)
Assessment: LDL-C is above goal of less than 70 Patient is very sensitive to medications and is hesitant to try new things understandably We discussed the risk factors for ASCVD.  She does have diabetes and elevated LDL-C.  Her blood pressure is well-controlled.  She is not very active but plans to increase this.  She is working on her diet to help better blood sugar control  Plan: Patient willing to try PCSK9 inhibitor.  A sample Repatha was given to her to determine if she can tolerate.  She will give first injection and then call us and let us know how she does.  At that point we will decide if we are going to go ahead and submit for prior authorization.  Patient states sometimes her reactions are with second dose so we could consider giving her second sample. Will await response from patient Encouraged to increase physical activity, suggested biking or swimming if this was easier on her back

## 2022-01-26 NOTE — Patient Instructions (Signed)
Please try the sample of Repatha. Let us know how you do. [phone]  Adopting a Healthy Lifestyle.   Weight: Know what a healthy weight is for you (roughly BMI <25) and aim to maintain this. You can calculate your body mass index on your smart phone  Diet: Aim for 7+ servings of fruits and vegetables daily Limit animal fats in diet for cholesterol and heart health - choose grass fed whenever available Avoid highly processed foods (fast food burgers, tacos, fried chicken, pizza, hot dogs, french fries)  Saturated fat comes in the form of butter, lard, coconut oil, margarine, partially hydrogenated oils, and fat in meat. These increase your risk of cardiovascular disease.  Use healthy plant oils, such as olive, canola, soy, corn, sunflower and peanut.  Whole foods such as fruits, vegetables and whole grains have fiber  Men need > 38 grams of fiber per day Women need > 25 grams of fiber per day  Load up on vegetables and fruits - one-half of your plate: Aim for color and variety, and remember that potatoes dont count. Go for whole grains - one-quarter of your plate: Whole wheat, barley, wheat berries, quinoa, oats, brown rice, and foods made with them. If you want pasta, go with whole wheat pasta. Protein power - one-quarter of your plate: Fish, chicken, beans, and nuts are all healthy, versatile protein sources. Limit red meat. You need carbohydrates for energy! The type of carbohydrate is more important than the amount. Choose carbohydrates such as vegetables, fruits, whole grains, beans, and nuts in the place of white rice, white pasta, potatoes (baked or fried), macaroni and cheese, cakes, cookies, and donuts.  If youre thirsty, drink water. Coffee and tea are good in moderation, but skip sugary drinks and limit milk and dairy products to one or two daily servings. Keep sugar intake at 6 teaspoons or 24 grams or LESS       Exercise: Aim for 150 min of moderate intensity exercise weekly  for heart health, and weights twice weekly for bone health Stay active - any steps are better than no steps! Aim for 7-9 hours of sleep daily

## 2022-01-26 NOTE — Progress Notes (Signed)
Patient ID: LOYD SALVADOR                 DOB: 1945-03-04                    MRN: [id number]      HPI: Jill Dean is a 77 y.o. female patient referred to lipid clinic by  Dr. Marlou Porch. PMH is significant for HTN, HLD, SVT, DM, GERD. Previously on atorvastatin but has muscle cramps and increased blood sugars. Latest LDL-C was 144., TG 287.   Patient presents today to lipid clinic.  Reports sensitivities to medications.  She has tried several all with muscle symptoms.  She has no interest in trying any other statins.  She reports she used to be very active until the day she got her COVID-vaccine and then started having issues with her back.  She has since received 4 steroid injections and is hoping to be able to start walking again.  She sees a homeopathic doctor and takes several supplements.  She was prescribed ezetimibe but did not want to take it due to its side effects of diarrhea.  Reviewed options for lowering LDL cholesterol, specifically PCSK-9 inhibitors.  Discussed mechanisms of action, dosing, side effects and potential decreases in LDL cholesterol.  Also reviewed cost information.   Current Medications: none Intolerances: atorvastatin (muscle cramps and increased blood sugar), simvastatin, pravastatin (no energy), rosuvastatin (myalgias) Risk Factors: DM, HTN LDL-C goal: <70 ApoB goal: <80 Non-HDL: <100  Diet:  Breakfast: cheerios w/ almonds and strawberries Lunch: grilled chicken salads, little bit dressing, chicken tenders -shake and bake or just seasoning, green beans Drink: un sweet tea with stevia, coffee with 1/2 and 1/2  Exercise: hopes to get back into walking now that she has had back injections  Family History: The patient's family history includes COPD in her paternal uncle; Dementia in her mother; Heart disease in her father and maternal uncle; Heart failure in her mother; Lung cancer in her father and paternal uncle.   Social History: no tobacco use, beer or wine every  once in awhile  Labs: Lipid Panel     Component Value Date/Time   CHOL 268 (H) 12/07/2021 0959   TRIG 287 (H) 12/07/2021 0959   HDL 73 12/07/2021 0959   CHOLHDL 3.7 12/07/2021 0959   CHOLHDL 3.8 06/21/2015 1007   VLDL 50 (H) 06/21/2015 1007   LDLCALC 144 (H) 12/07/2021 0959   LABVLDL 51 (H) 12/07/2021 0959    Past Medical History:  Diagnosis Date   Chest pain    a. s/p normal ett nuclear stress test on 18/84/16   Complication of anesthesia    COVID-19 12/2019   Diabetes mellitus without complication (HCC)    Dysrhythmia    fast heart rate   GERD (gastroesophageal reflux disease)    HLD (hyperlipidemia)    Hypothyroidism    Obesity    Osteoarthrosis, unspecified whether generalized or localized, unspecified site    Other chronic allergic conjunctivitis    PONV (postoperative nausea and vomiting)    Raynaud's syndrome    Urticaria, unspecified     Current Outpatient Medications on File Prior to Visit  Medication Sig Dispense Refill   AMBULATORY NON FORMULARY MEDICATION Take 1 capsule by mouth daily at 6 (six) AM. Medication Name: recuperate IQ Copper, beef liver powder, spirulina, tumeric, boron     amitriptyline (ELAVIL) 25 MG tablet Take 25 mg by mouth at bedtime.     calcium-vitamin D (OSCAL WITH  D) 500-200 MG-UNIT tablet Take 1 tablet by mouth daily with breakfast.      cetirizine (ZYRTEC) 10 MG tablet Take 10 mg by mouth daily.     Cod Liver Oil CAPS Take 1 capsule by mouth daily.     Continuous Blood Gluc Sensor (FREESTYLE LIBRE 2 SENSOR) MISC 1 Device by Does not apply route every 14 (fourteen) days. 6 each 3   fluticasone (FLONASE) 50 MCG/ACT nasal spray Place into both nostrils daily.     glimepiride (AMARYL) 4 MG tablet Take 1 tablet (4 mg total) by mouth daily before breakfast. 90 tablet 3   ibuprofen (ADVIL) 200 MG tablet Take 400 mg by mouth at bedtime as needed for moderate pain or mild pain.     levothyroxine (SYNTHROID, LEVOTHROID) 100 MCG tablet Take 100  mcg by mouth daily.     Lutein 20 MG CAPS Take 1 capsule by mouth daily.     Magnesium 400 MG CAPS Take 1 tablet by mouth daily.     metoprolol succinate (TOPROL-XL) 50 MG 24 hr tablet TAKE 1 TABLET BY MOUTH EVERY DAY WITH OR IMMEDIATELY FOLLOWING A MEAL 90 tablet 2   Misc Natural Products (YUMVS BEET ROOT-TART CHERRY PO) Take 1 capsule by mouth daily at 6 (six) AM.     omeprazole (PRILOSEC) 40 MG capsule Take by mouth.     Probiotic Product (MVW COMPLETE PROBIOTIC PO) Take 1 capsule by mouth daily.     EPINEPHrine 0.3 mg/0.3 mL IJ SOAJ injection Inject 0.3 mg into the muscle as needed for anaphylaxis. 1 each 0   glucose blood (FREESTYLE LITE) test strip 1 each by Other route daily in the afternoon. Use as instructed 100 each 3   No current facility-administered medications on file prior to visit.    Allergies  Allergen Reactions   Hydrocod Poli-Chlorphe Poli Er Other (See Comments)    hyperactivity   Pseudoephedrine Itching, Palpitations and Other (See Comments)    REACTION: tachycardia    Sulfonamide Derivatives Anaphylaxis and Swelling   Azithromycin Itching   Buprenorphine Hcl Itching   Codeine Itching   Dilaudid [Hydromorphone Hcl] Itching   Macrodantin [Nitrofurantoin] Itching    Hands and feet itching. "Trouble breathing" w/lump in throat and stuffy nose.   Morphine And Related Itching   Shrimp [Shellfish Allergy] Itching   Simvastatin Other (See Comments)    Fatigue, depression and dizziness at 20 mg   Atorvastatin     Muscle cramps  And her blood sugar kept going up   Cephalosporins Swelling    Said to cause facial swelling   Covid-19 (Adenovirus) Vaccine     Other reaction(s): rash, itching, skin tight   Empagliflozin     Other reaction(s): yeast infection   Erythromycin Base Itching and Swelling   Hydrocod Poli-Chlorphe Poli Er     Other reaction(s): flushing and facial edema   Hydrocodone-Acetaminophen Other (See Comments)    Redness of face, swelling of  tolerates tylenol   Metformin Hcl     Other reaction(s): epigastric pain   Metronidazole     Other reaction(s): tonsil swelling   Other     Rybelsus---anaphylaxsis   Penicillinase Itching   Sulfamethoxazole Itching   Clarithromycin Itching    REACTION: severe itching    Assessment/Plan:  1. Hyperlipidemia -  HLD (hyperlipidemia) Assessment: LDL-C is above goal of less than 70 Patient is very sensitive to medications and is hesitant to try new things understandably We discussed the risk factors for ASCVD.  She does have diabetes and elevated LDL-C.  Her blood pressure is well-controlled.  She is not very active but plans to increase this.  She is working on her diet to help better blood sugar control  Plan: Patient willing to try PCSK9 inhibitor.  A sample Repatha was given to her to determine if she can tolerate.  She will give first injection and then call us and let us know how she does.  At that point we will decide if we are going to go ahead and submit for prior authorization.  Patient states sometimes her reactions are with second dose so we could consider giving her second sample. Will await response from patient Encouraged to increase physical activity, suggested biking or swimming if this was easier on her back   Thank you,  Ramond Dial, Pharm.D, BCPS, CPP [facility] of [facility] [place] [address], [place], [place] [address]  Phone: [phone]; Fax: [phone]

## 2022-01-31 DIAGNOSIS — D225 Melanocytic nevi of trunk: Secondary | ICD-10-CM | POA: Diagnosis not present

## 2022-01-31 DIAGNOSIS — C44622 Squamous cell carcinoma of skin of right upper limb, including shoulder: Secondary | ICD-10-CM | POA: Diagnosis not present

## 2022-01-31 DIAGNOSIS — B079 Viral wart, unspecified: Secondary | ICD-10-CM | POA: Diagnosis not present

## 2022-01-31 DIAGNOSIS — L814 Other melanin hyperpigmentation: Secondary | ICD-10-CM | POA: Diagnosis not present

## 2022-01-31 DIAGNOSIS — L820 Inflamed seborrheic keratosis: Secondary | ICD-10-CM | POA: Diagnosis not present

## 2022-01-31 DIAGNOSIS — L538 Other specified erythematous conditions: Secondary | ICD-10-CM | POA: Diagnosis not present

## 2022-01-31 DIAGNOSIS — L239 Allergic contact dermatitis, unspecified cause: Secondary | ICD-10-CM | POA: Diagnosis not present

## 2022-01-31 DIAGNOSIS — D485 Neoplasm of uncertain behavior of skin: Secondary | ICD-10-CM | POA: Diagnosis not present

## 2022-01-31 DIAGNOSIS — C44629 Squamous cell carcinoma of skin of left upper limb, including shoulder: Secondary | ICD-10-CM | POA: Diagnosis not present

## 2022-01-31 DIAGNOSIS — L821 Other seborrheic keratosis: Secondary | ICD-10-CM | POA: Diagnosis not present

## 2022-02-03 ENCOUNTER — Other Ambulatory Visit (HOSPITAL_COMMUNITY): Payer: Self-pay

## 2022-02-06 ENCOUNTER — Telehealth: Payer: Self-pay | Admitting: Pharmacist

## 2022-02-06 NOTE — Telephone Encounter (Signed)
Patient called. She is concern about side effects of PCSK9i. States she reacts to medications easily and afraid to do injections own her own. We discussed how we look at the risk vs benefit for any medications/ therapy/procedure.  Offer to help with administration of 1st injection in the office so that she could understand the injection steps thoroughly before she start injecting own her own. Patient will think about it and call us back.

## 2022-02-14 ENCOUNTER — Other Ambulatory Visit: Payer: Self-pay | Admitting: Internal Medicine

## 2022-02-14 ENCOUNTER — Ambulatory Visit
Admission: RE | Admit: 2022-02-14 | Discharge: 2022-02-14 | Disposition: A | Discharge status: Home / Self Care | Payer: PPO | Source: Ambulatory Visit | Attending: Internal Medicine | Admitting: Internal Medicine

## 2022-02-14 DIAGNOSIS — S29011A Strain of muscle and tendon of front wall of thorax, initial encounter: Secondary | ICD-10-CM

## 2022-02-14 DIAGNOSIS — R0781 Pleurodynia: Secondary | ICD-10-CM | POA: Diagnosis not present

## 2022-02-14 DIAGNOSIS — E1165 Type 2 diabetes mellitus with hyperglycemia: Secondary | ICD-10-CM | POA: Diagnosis not present

## 2022-02-14 DIAGNOSIS — S2341XA Sprain of ribs, initial encounter: Secondary | ICD-10-CM | POA: Diagnosis not present

## 2022-02-14 DIAGNOSIS — Z6841 Body Mass Index (BMI) 40.0 and over, adult: Secondary | ICD-10-CM | POA: Diagnosis not present

## 2022-02-14 DIAGNOSIS — E039 Hypothyroidism, unspecified: Secondary | ICD-10-CM | POA: Diagnosis not present

## 2022-02-14 DIAGNOSIS — Z8679 Personal history of other diseases of the circulatory system: Secondary | ICD-10-CM | POA: Diagnosis not present

## 2022-03-07 ENCOUNTER — Telehealth: Payer: Self-pay

## 2022-03-07 NOTE — Telephone Encounter (Signed)
PA request received via CMM for FreeStyle Lite Test strips  PA has been submitted to [employer] and is pending determination.  Key: [id number]

## 2022-03-08 NOTE — Telephone Encounter (Signed)
PA has been DENIED due to:   This request was denied under your Medicare Part D benefit; however, coverage/payment for the requested drug(s) has been APPROVED under Medicare Part B as long as your doctor continues to prescribe the drug for you, and it continues to be safe and

## 2022-03-21 ENCOUNTER — Encounter: Payer: Self-pay | Admitting: Nurse Practitioner

## 2022-03-22 ENCOUNTER — Encounter: Payer: Self-pay | Admitting: Nurse Practitioner

## 2022-03-22 ENCOUNTER — Ambulatory Visit (INDEPENDENT_AMBULATORY_CARE_PROVIDER_SITE_OTHER): Payer: PPO | Admitting: Nurse Practitioner

## 2022-03-22 VITALS — BP 112/78 | HR 90 | Ht 62.25 in | Wt 239.0 lb

## 2022-03-22 DIAGNOSIS — E1165 Type 2 diabetes mellitus with hyperglycemia: Secondary | ICD-10-CM | POA: Diagnosis not present

## 2022-03-22 DIAGNOSIS — Z01419 Encounter for gynecological examination (general) (routine) without abnormal findings: Secondary | ICD-10-CM

## 2022-03-22 DIAGNOSIS — E785 Hyperlipidemia, unspecified: Secondary | ICD-10-CM | POA: Diagnosis not present

## 2022-03-22 DIAGNOSIS — Z0000 Encounter for general adult medical examination without abnormal findings: Secondary | ICD-10-CM | POA: Diagnosis not present

## 2022-03-22 DIAGNOSIS — M85859 Other specified disorders of bone density and structure, unspecified thigh: Secondary | ICD-10-CM | POA: Diagnosis not present

## 2022-03-22 DIAGNOSIS — Z1331 Encounter for screening for depression: Secondary | ICD-10-CM | POA: Diagnosis not present

## 2022-03-22 DIAGNOSIS — Z23 Encounter for immunization: Secondary | ICD-10-CM | POA: Diagnosis not present

## 2022-03-22 DIAGNOSIS — M8589 Other specified disorders of bone density and structure, multiple sites: Secondary | ICD-10-CM

## 2022-03-22 DIAGNOSIS — R079 Chest pain, unspecified: Secondary | ICD-10-CM | POA: Diagnosis not present

## 2022-03-22 DIAGNOSIS — M47816 Spondylosis without myelopathy or radiculopathy, lumbar region: Secondary | ICD-10-CM | POA: Diagnosis not present

## 2022-03-22 DIAGNOSIS — Z780 Asymptomatic menopausal state: Secondary | ICD-10-CM

## 2022-03-22 NOTE — Progress Notes (Signed)
   Jill Dean [birth year] [id number]   History:  77 y.o. G1P1001 presents for breast and pelvic exam. Postmenopausal - no HRT. S/P 2004 TAH BSO for endometriosis.  Normal pap and mammogram history. Osteopenia, T2DM, hypothyroidism managed by PCP. Asking about DXA. Last done in 2017 (per our records).   Gynecologic History No LMP recorded. Patient has had a hysterectomy.   Contraception: status post hysterectomy Sexually active: No  Health maintenance Last Pap: 08/24/2010. Results were: Normal Last mammogram: 10/2021. Results were: Normal per patient Last colonoscopy: 2018. Results were: normal, 10-year recall Last Dexa: 2017. Results were: T-score -1.7, FRAX 14.9% / 2.9%  Past medical history, past surgical history, family history and social history were all reviewed and documented in the EPIC chart. Married. Retired. 47 yo grandson.   ROS:  A ROS was performed and pertinent positives and negatives are included.  Exam:  Vitals:   03/22/22 1135  BP: 112/78  Pulse: 90  SpO2: 97%  Weight: 239 lb (108.4 kg)  Height: 5' 2.25" (1.581 m)    Body mass index is 43.36 kg/m.  General appearance:  Normal Thyroid:  Symmetrical, normal in size, without palpable masses or nodularity. Respiratory  Auscultation:  Clear without wheezing or rhonchi Cardiovascular  Auscultation:  Regular rate, without rubs, murmurs or gallops  Edema/varicosities:  Not grossly evident Abdominal  Soft,nontender, without masses, guarding or rebound.  Liver/spleen:  No organomegaly noted  Hernia:  None appreciated  Skin  Inspection:  Grossly normal   Breasts: Examined lying and sitting.   Right: Without masses, retractions, discharge or axillary adenopathy.   Left: Without masses, retractions, discharge or axillary adenopathy. Genitourinary   Inguinal/mons:  Normal without inguinal adenopathy  External genitalia:  Normal appearing vulva with no masses, tenderness, or lesions  BUS/Urethra/Skene's glands:   Normal  Vagina:  Normal appearing with normal color and discharge, no lesions  Cervix:  and uterus absent  Adnexa/parametria:     Rt: Normal in size, without masses or tenderness.   Lt: Normal in size, without masses or tenderness.  Anus and perineum: Normal  Digital rectal exam: Deferred  Patient informed chaperone available to be present for breast and pelvic exam. Patient has requested no chaperone to be present. Patient has been advised what will be completed during breast and pelvic exam.   Assessment/Plan:  78 y.o. G1P1001 for breast and pelvic exam.   Encounter for breast and pelvic examination - Education provided on SBEs, importance of preventative screenings, current guidelines, high calcium diet, regular exercise, and multivitamin daily. Labs with PCP.   Postmenopausal - no HRT. S/P 2004 TAH BSO for endometriosis.   Osteopenia of multiple sites - DEXA 2017 T-score -1.7, FRAX 14.9% / 2.9%. Managed by PCP. She is asking why this has not been repeated since 2017. Recommend discussing with PCP at her visit today as DXAs have been done at there office previously. If she has trouble getting it scheduled there she wants to have it done here. Continue Vitamin D supplement and regular exercise.  Screening for cervical cancer - Normal Pap history.  No longer screening per guidelines.   Screening for breast cancer - Normal mammogram history.  Continue annual screenings.  Normal breast exam today.  Screening for colon cancer - 2018 colonoscopy. Will repeat at GI's recommended interval.   Return in 2 years for breast and pelvic exam.      Tamela Gammon [facility], 12:07 PM 03/22/2022

## 2022-03-27 ENCOUNTER — Encounter: Payer: Self-pay | Admitting: Nurse Practitioner

## 2022-03-29 DIAGNOSIS — C44629 Squamous cell carcinoma of skin of left upper limb, including shoulder: Secondary | ICD-10-CM | POA: Diagnosis not present

## 2022-03-29 DIAGNOSIS — L820 Inflamed seborrheic keratosis: Secondary | ICD-10-CM | POA: Diagnosis not present

## 2022-03-29 DIAGNOSIS — D0461 Carcinoma in situ of skin of right upper limb, including shoulder: Secondary | ICD-10-CM | POA: Diagnosis not present

## 2022-03-29 DIAGNOSIS — D492 Neoplasm of unspecified behavior of bone, soft tissue, and skin: Secondary | ICD-10-CM | POA: Diagnosis not present

## 2022-03-29 DIAGNOSIS — M8589 Other specified disorders of bone density and structure, multiple sites: Secondary | ICD-10-CM | POA: Diagnosis not present

## 2022-03-29 DIAGNOSIS — L239 Allergic contact dermatitis, unspecified cause: Secondary | ICD-10-CM | POA: Diagnosis not present

## 2022-03-29 DIAGNOSIS — Z780 Asymptomatic menopausal state: Secondary | ICD-10-CM | POA: Diagnosis not present

## 2022-04-24 DIAGNOSIS — Z9181 History of falling: Secondary | ICD-10-CM | POA: Diagnosis not present

## 2022-04-24 DIAGNOSIS — Z6841 Body Mass Index (BMI) 40.0 and over, adult: Secondary | ICD-10-CM | POA: Diagnosis not present

## 2022-04-24 DIAGNOSIS — E785 Hyperlipidemia, unspecified: Secondary | ICD-10-CM | POA: Diagnosis not present

## 2022-04-24 DIAGNOSIS — E1165 Type 2 diabetes mellitus with hyperglycemia: Secondary | ICD-10-CM | POA: Diagnosis not present

## 2022-04-24 DIAGNOSIS — I499 Cardiac arrhythmia, unspecified: Secondary | ICD-10-CM | POA: Diagnosis not present

## 2022-04-24 DIAGNOSIS — K219 Gastro-esophageal reflux disease without esophagitis: Secondary | ICD-10-CM | POA: Diagnosis not present

## 2022-04-28 ENCOUNTER — Telehealth: Payer: Self-pay

## 2022-04-28 DIAGNOSIS — N760 Acute vaginitis: Secondary | ICD-10-CM

## 2022-04-28 MED ORDER — FLUCONAZOLE 150 MG PO TABS: ORAL_TABLET | ORAL | 0 refills | Status: DC

## 2022-04-28 NOTE — Telephone Encounter (Signed)
Per ML: "Yes.  Fluconazole 150 mg 1 tab PO every other day x 3 or Terazol 3."   Pt notified and voiced understanding. Rx sent to preferred pharmacy.

## 2022-04-28 NOTE — Telephone Encounter (Signed)
TW pt calling to request med for yeast infection.  UTD on AEX.  OK to send?

## 2022-05-20 IMAGING — MR MR LUMBAR SPINE W/O CM
4 of 5 series · 25 of 48 positions shown · non-contrast
Comparison: Radiographs dated February 21, 2021

CLINICAL DATA: Spinal stenosis, lumbar sciatica.

EXAM:
MRI LUMBAR SPINE WITHOUT CONTRAST
TECHNIQUE: Multiplanar, multisequence MR imaging of the lumbar spine was
performed. No intravenous contrast was administered.

[Series 2: T2 · sagittal · 4.0mm · 0.53mm/px · 6 of 15 slices shown (1 of 2)]
[im 1/15]
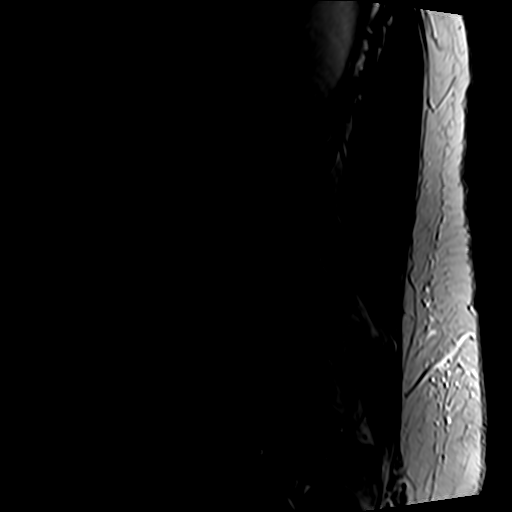
[im 3/15]
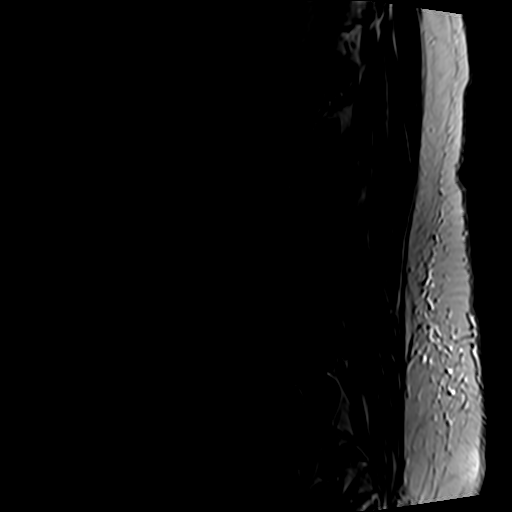
[im 6/15]
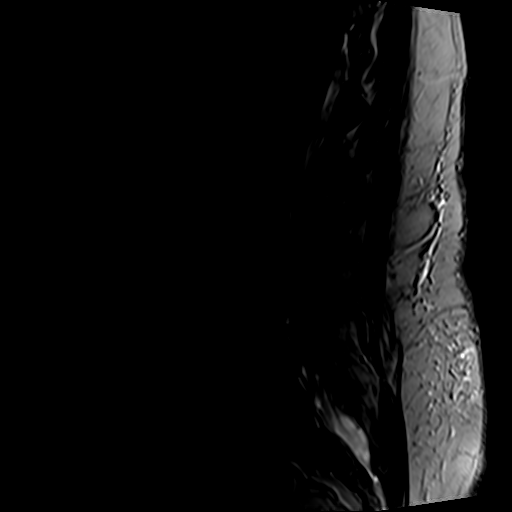
[im 9/15]
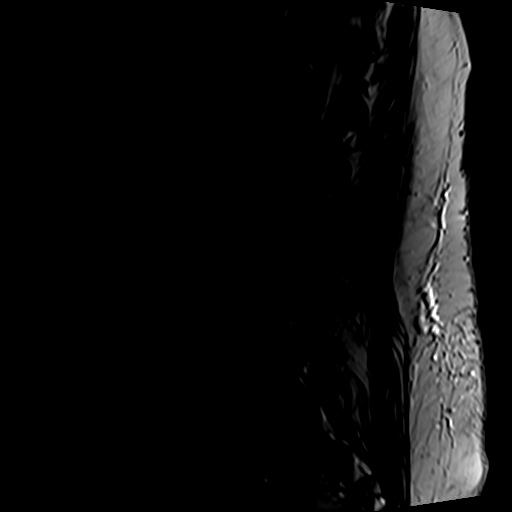
[im 12/15]
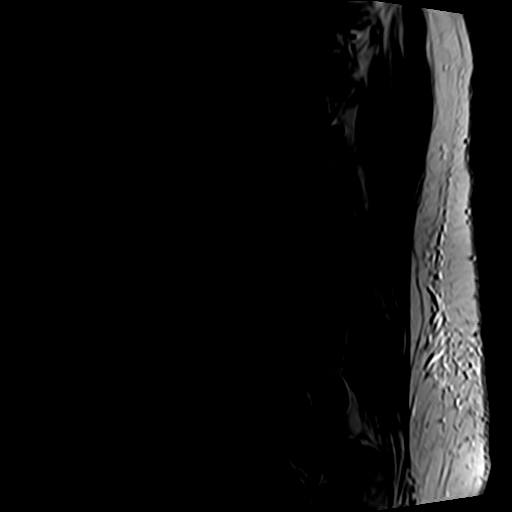
[im 15/15]
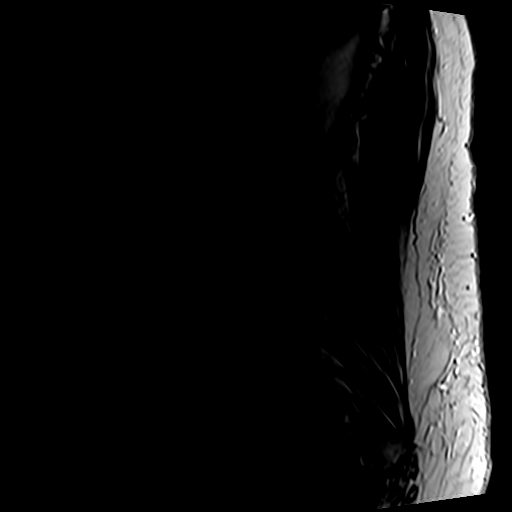

[Series 4: T1 · sagittal · 4.0mm · 0.53mm/px · 6 of 15 slices shown (1 of 2)]
[im 1/15]
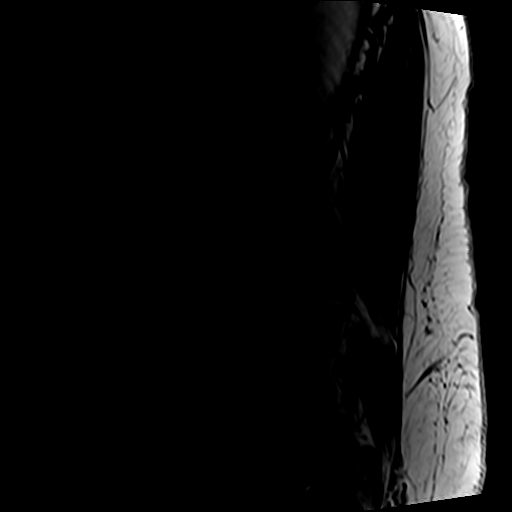
[im 3/15]
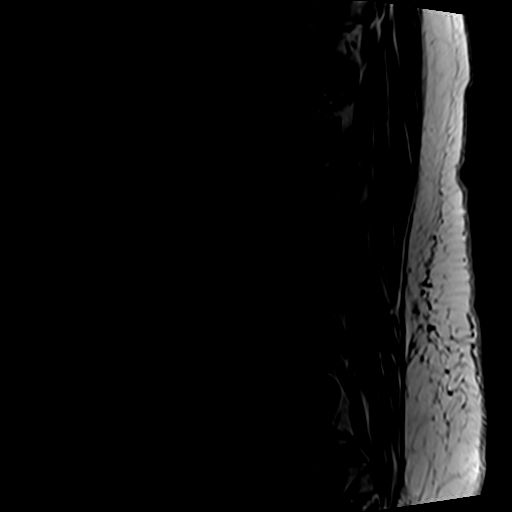
[im 6/15]
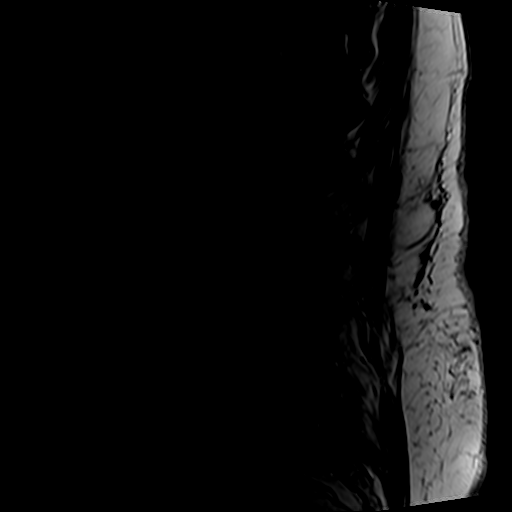
[im 9/15]
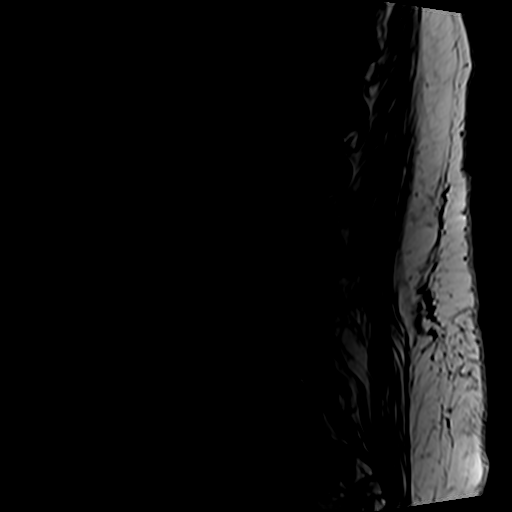
[im 12/15]
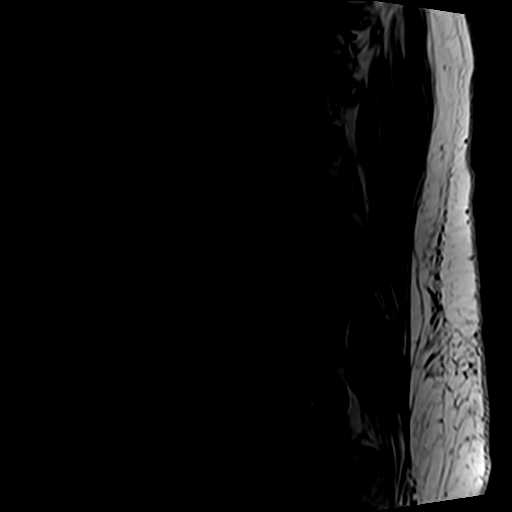
[im 15/15]
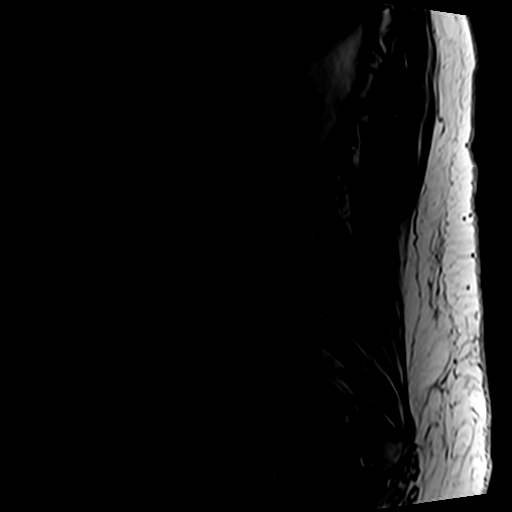

[Series 5: T2 · axial · 4.0mm · 0.70mm/px · z∈[-122,+90]mm · 9 of 38 slices shown (2 of 2)]
[im 1/38]
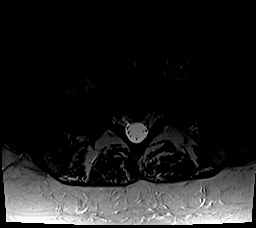
[im 6/38]
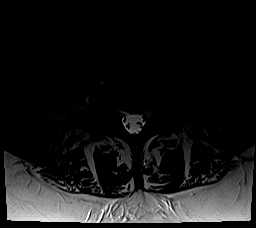
[im 11/38]
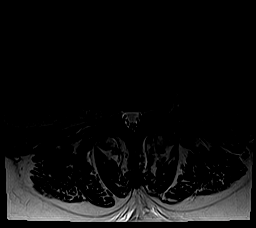
[im 16/38]
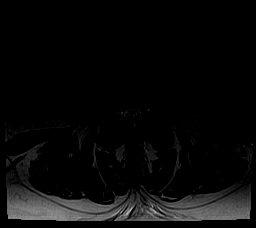
[im 19/38]
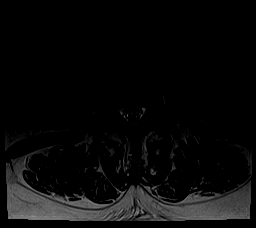
[im 22/38]
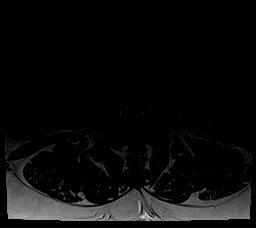
[im 27/38]
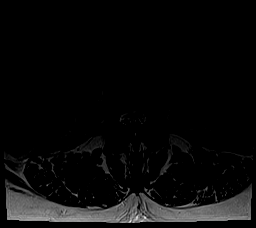
[im 32/38]
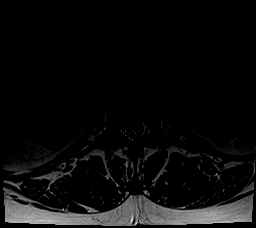
[im 38/38]
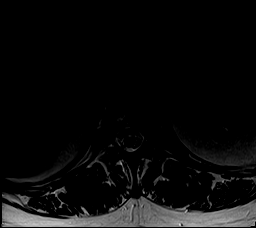

[Series 6: T1 · axial · 4.0mm · 0.35mm/px · z∈[-122,+58]mm · 4 of 38 slices shown (2 of 2)]
[im 1/38]
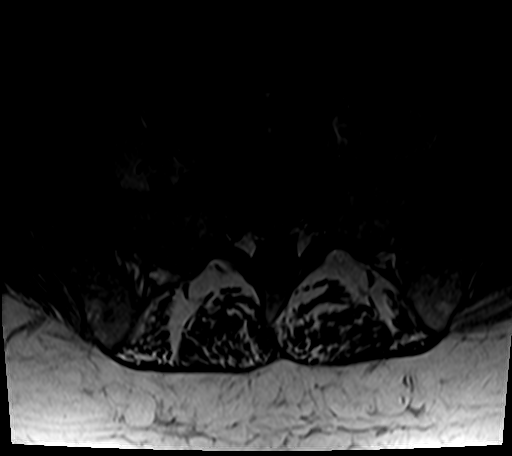
[im 6/38]
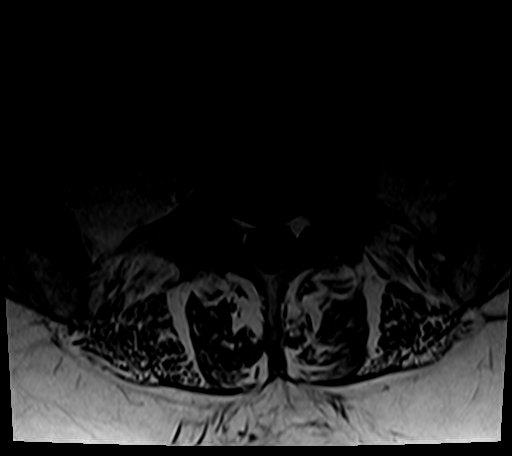
[im 19/38]
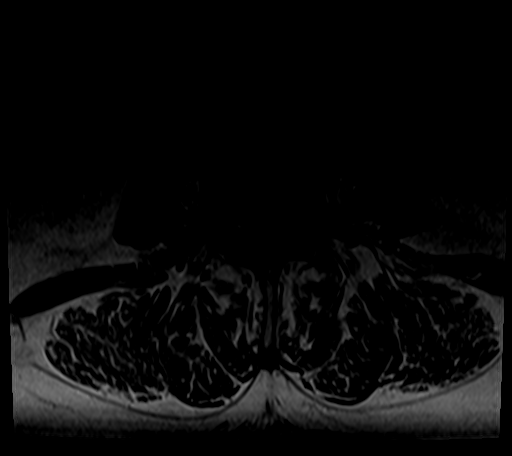
[im 32/38]
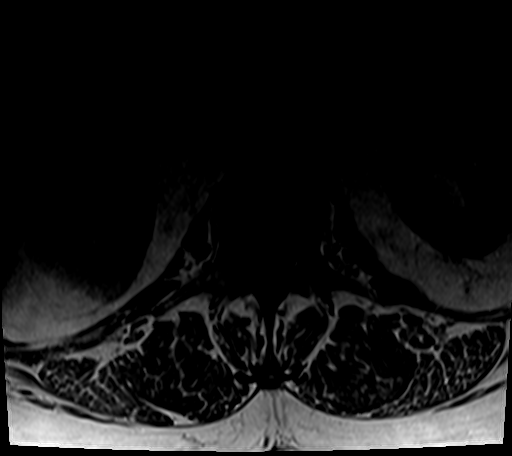

[25 of 48 positions shown; findings below may reference images not displayed]

FINDINGS: Segmentation:  Standard.

Alignment:  4 mm anterolisthesis of L4.

Vertebrae: No fracture, evidence of discitis, or bone lesion. Likely
small hemangioma in T12 vertebral body.

Conus medullaris and cauda equina: Conus extends to the L1 level.
Conus and cauda equina appear normal.

Paraspinal and other soft tissues: Nonspecific subcutaneous soft
tissue edema.

Disc spaces:

T12-L1: No significant disc bulge. No neural foraminal stenosis. No
central canal stenosis. Mild facet joint arthropathy

L1-L2: No significant disc bulge. Mild ligamentum flavum
hypertrophy. No neural foraminal stenosis. No central canal
stenosis. Mild facet joint arthropathy Hedy.

L2-L3: Mild circumferential disc protrusion and ligamentum flavum
hypertrophy with narrowing of spinal canal. Mild facet joint
arthropathy. No significant neural foraminal narrowing.

L3-L4: Circumferential disc protrusion and ligamentum flavum
hypertrophy with mild-to-moderate narrowing of spinal canal. Mild
narrowing of bilateral lateral recesses. Moderate facet joint
arthropathy. No significant neural foraminal narrowing.

L4-L5: Moderate circumferential disc protrusion and ligamentum
flavum hypertrophy with narrowing of spinal canal. Narrowing of
lateral recesses bilaterally with encroachment of the L5 nerve roots
bilaterally. Moderate facet joint arthropathy, left worse than the
right. No significant neural foraminal narrowing.

L5-S1: Moderate disc height loss with circumferential disc
protrusion and subarticular narrowing bilaterally. Encroachment of
the bilateral S1 nerve roots. Moderate facet joint arthropathy. Mild
bilateral neural foraminal narrowing at L5.
IMPRESSION: 1.  No evidence of acute fracture or subluxation.

2. Multilevel degenerate disc disease prominent at L4-L5 and L5-S1
as detailed above.

## 2022-05-30 DIAGNOSIS — L239 Allergic contact dermatitis, unspecified cause: Secondary | ICD-10-CM | POA: Diagnosis not present

## 2022-06-05 ENCOUNTER — Ambulatory Visit: Payer: PPO | Admitting: Internal Medicine

## 2022-06-05 NOTE — Progress Notes (Deleted)
Name: BILLYE DIA  Age/ Sex: 77 y.o., female   MRN/ DOB: [id number], 1945/07/08     PCP: Lorenda Ishihara, MD   Reason for Endocrinology Evaluation: Type 2 Diabetes Mellitus  Initial Endocrine Consultative Visit: 04/18/2019    PATIENT IDENTIFIER: Ms. KAZIYA YZAGUIRRE is a 77 y.o. female with a past medical history of T2DM, Hypothyroidism and Dyslipidemia. The patient has followed with Endocrinology clinic since 04/18/2019 for consultative assistance with management of her diabetes.  DIABETIC HISTORY:  Ms. Greenhill was diagnosed with DM in 2013. Metformin - GI issues, Jardiance - recurrent yeast infection, Glipizide- Hives . Her hemoglobin A1c has ranged from 7.0% in 2015, peaking at 7.8% in 2020.  In her initial visit to our clinic she had an A1c of 7.0%, we stopped Jardiance due to recurrent yeast infections. She is allergic due to Glipizide  Stop Rybelsus March 2023 due to pruritus, we attempted again later in 2023 with similar results  SUBJECTIVE:   During the last visit (12/05/2021): A1c 7.4%   Today (06/05/2022): Ms. Detorres is here for a follow up on diabetes management.  She checks her blood sugars 2 times daily. The patient has not had hypoglycemic episodes since the last clinic visit.  She continues to follow-up with cardiology for SVT     HOME DIABETES REGIMEN:  Glimepiride 4 mg, 1 tab daily       Statin: Intolerant ACE-I/ARB: no    CONTINUOUS GLUCOSE MONITORING RECORD INTERPRETATION    Dates of Recording: 11/21 - 12/05/2021  Sensor description: [employer] 2  Results statistics:   CGM use % of time 95  Average and SD 135/23.8  Time in range     90   %  % Time Above 180 9  % Time above 250 0  % Time Below target 1   Glycemic patterns summary: Optimal BG's overnight and during the day except suppertime  Hyperglycemic episodes postprandial  Hypoglycemic episodes occurred during the day  Overnight periods: Optimal    DIABETIC  COMPLICATIONS: Microvascular complications:    Denies: CKD, retinopathy , neuropathy  Last eye exam: Completed 07/18/2021   Macrovascular complications:    Denies: CAD, PVD, CVA     HISTORY:  Past Medical History:  Past Medical History:  Diagnosis Date   Chest pain    a. s/p normal ett nuclear stress test on 10/16/13   Complication of anesthesia    COVID-19 12/2019   Diabetes mellitus without complication (HCC)    Dysrhythmia    fast heart rate   GERD (gastroesophageal reflux disease)    HLD (hyperlipidemia)    Hypothyroidism    Obesity    Osteoarthrosis, unspecified whether generalized or localized, unspecified site    Other chronic allergic conjunctivitis    PONV (postoperative nausea and vomiting)    Raynaud's syndrome    Urticaria, unspecified    Past Surgical History:  Past Surgical History:  Procedure Laterality Date   CATARACT EXTRACTION Bilateral 2023   CHOLECYSTECTOMY     CYST EXCISION Left 11/10/2014   Procedure: EXCISION CYST LEFT INDEX FINGER AND CYST LEFT SMALL FINGER ;  Surgeon: Cindee Salt, MD;  Location: [facility];  Service: Orthopedics;  Laterality: Left;   ESOPHAGOGASTRODUODENOSCOPY N/A 01/05/2014   Procedure: ESOPHAGOGASTRODUODENOSCOPY (EGD);  Surgeon: Charolett Bumpers, MD;  Location: [facility];  Service: Endoscopy;  Laterality: N/A;   knot right breast     LEFT HEART CATHETERIZATION WITH CORONARY ANGIOGRAM N/A 10/22/2013   Procedure: LEFT HEART CATHETERIZATION  WITH CORONARY ANGIOGRAM;  Surgeon: Iran Ouch, MD;  Location: [facility];  Service: Cardiovascular;  Laterality: N/A;   OOPHORECTOMY     LSO 84-RSO 04   thyroid nodule     TONSILLECTOMY     VAGINAL HYSTERECTOMY  01/02/2002   LAVH RSO endometriosis   VESICOVAGINAL FISTULA CLOSURE W/ TAH     Social History:  reports that she has never smoked. She has never used smokeless tobacco. She reports current alcohol use. She reports that she does not use drugs. Family  History:  Family History  Problem Relation Age of Onset   Lung cancer Father    Heart disease Father    Heart failure Mother    Dementia Mother    Heart disease Maternal Uncle    Lung cancer Paternal Uncle    COPD Paternal Uncle      HOME MEDICATIONS: Allergies as of 06/05/2022       Reactions   Hydrocod Poli-chlorphe Poli Er Other (See Comments)   hyperactivity   Pseudoephedrine Itching, Palpitations, Other (See Comments)   REACTION: tachycardia   Sulfonamide Derivatives Anaphylaxis, Swelling   Azithromycin Itching   Buprenorphine Hcl Itching   Codeine Itching   Dilaudid [hydromorphone Hcl] Itching   Macrodantin [nitrofurantoin] Itching   Hands and feet itching. "Trouble breathing" w/lump in throat and stuffy nose.   Morphine And Codeine Itching   Shrimp Extract Itching   Shrimp [shellfish Allergy] Itching   Simvastatin Other (See Comments)   Fatigue, depression and dizziness at 20 mg   Atorvastatin    Muscle cramps  And her blood sugar kept going up   Cefuroxime    Other Reaction(s): swelling eyes   Cephalosporins Swelling   Said to cause facial swelling   Covid-19 (adenovirus) Vaccine    Other reaction(s): rash, itching, skin tight   Empagliflozin    Other reaction(s): yeast infection   Erythromycin Base Itching, Swelling   Hydrocod Poli-chlorphe Poli Er    Other reaction(s): flushing and facial edema   Hydrocodone-acetaminophen Other (See Comments)   Redness of face, swelling of tolerates tylenol   Iodine    Other Reaction(s): Unknown   Mercaptobenzothiazole    Other Reaction(s): Unknown   Metformin Hcl    Other reaction(s): epigastric pain   Metronidazole    Other reaction(s): tonsil swelling   Other    Rybelsus---anaphylaxsis   Penicillinase Itching   Quinolones    Other Reaction(s): Unknown   Semaglutide Itching   Statins    Other Reaction(s): dizziness,felt depressed, myalgia, Other (See Comments) Muscle cramps , And her blood sugar kept going up    Sulfamethoxazole Itching   Clarithromycin Itching   REACTION: severe itching        Medication List        Accurate as of June 05, 2022  7:32 AM. If you have any questions, ask your nurse or doctor.          AMBULATORY NON FORMULARY MEDICATION Take 1 capsule by mouth daily at 6 (six) AM. Medication Name: recuperate IQ Copper, beef liver powder, spirulina, tumeric, boron   amitriptyline 25 MG tablet Commonly known as: ELAVIL Take 25 mg by mouth at bedtime.   cetirizine 10 MG tablet Commonly known as: ZYRTEC Take 10 mg by mouth daily.   Cod Liver Oil Caps Take 1 capsule by mouth daily.   EPINEPHrine 0.3 mg/0.3 mL Soaj injection Commonly known as: EPI-PEN Inject 0.3 mg into the muscle as needed for anaphylaxis.   fluconazole  150 MG tablet Commonly known as: DIFLUCAN Take one tab po q other day x3 doses.   fluticasone 50 MCG/ACT nasal spray Commonly known as: FLONASE Place into both nostrils daily.   FreeStyle Libre 2 Sensor Misc 1 Device by Does not apply route every 14 (fourteen) days.   FREESTYLE LITE test strip Generic drug: glucose blood 1 each by Other route daily in the afternoon. Use as instructed   glimepiride 4 MG tablet Commonly known as: AMARYL Take 1 tablet (4 mg total) by mouth daily before breakfast.   ibuprofen 200 MG tablet Commonly known as: ADVIL Take 400 mg by mouth at bedtime as needed for moderate pain or mild pain.   levothyroxine 100 MCG tablet Commonly known as: SYNTHROID Take 100 mcg by mouth daily.   Lutein 20 MG Caps Take 1 capsule by mouth daily.   Magnesium 400 MG Caps Take 1 tablet by mouth daily.   metoprolol succinate 50 MG 24 hr tablet Commonly known as: TOPROL-XL TAKE 1 TABLET BY MOUTH EVERY DAY WITH OR IMMEDIATELY FOLLOWING A MEAL   MVW COMPLETE PROBIOTIC PO Take 1 capsule by mouth daily.   omeprazole 40 MG capsule Commonly known as: PRILOSEC Take by mouth.   YUMVS BEET ROOT-TART CHERRY PO Take 1  capsule by mouth daily at 6 (six) AM.         OBJECTIVE:   Vital Signs: There were no vitals taken for this visit.  Wt Readings from Last 3 Encounters:  03/22/22 239 lb (108.4 kg)  12/07/21 234 lb (106.1 kg)  12/05/21 234 lb 6.4 oz (106.3 kg)     Exam: General: Pt appears well and is in NAD  Lungs: Clear with good BS bilat   Heart: RRR   Extremities: No pretibial edema.   Neuro: MS is good with appropriate affect, pt is alert and Ox3   DM foot exam:07/18/2021   The skin of the feet without sores or ulcerations. Plantar callous formation noted bilaterally  The pedal pulses are 2+ on right and 2+ on left. The sensation is intact to a screening 5.07, 10 gram monofilament bilaterally     DATA REVIEWED:  Lab Results  Component Value Date   HGBA1C 7.1 (A) 12/05/2021   HGBA1C 6.9 (A) 01/17/2021   HGBA1C 8.1 (A) 10/14/2020    Latest Reference Range & Units 07/18/21 11:40  TSH 0.35 - 5.50 uIU/mL 3.13     06/03/2021 A1c 7.4%    03/08/2021 BUN 17 Cr . 0.860 GFR 70 lDL 138   ASSESSMENT / PLAN / RECOMMENDATIONS:   1) Type 2 Diabetes Mellitus, Optimally  controlled, Without complications - Most recent A1c of 7.1  %. Goal A1c < 7.0 %.    Patient is intolerant to Jardiance due to recurrent vaginal yeast infections, and intolerant to Metformin as well as Glipizide - rash  Intolerant to Rybelsus-rash  She is doing well with freestyle libre, extra strips compatible with freestyle libre   MEDICATIONS:  - Continue  Glimepiride  4 mg daily   EDUCATION / INSTRUCTIONS: BG monitoring instructions: Patient is instructed to check her blood sugars 1 times a day, fasting . Call [facility] [facility] if: BG persistently < 70      2) Diabetic complications:  Eye: Does not  have known diabetic retinopathy.  Neuro/ Feet: Does not have known diabetic peripheral neuropathy .  Renal: Patient does not have known baseline CKD. She   is not on an ACEI/ARB at present.   3)  Hypothyroidism:   - TSH is normal  - Continue Synthroid 100 mcg daily      F/U in 4 months    Signed electronically by: Lyndle Herrlich, MD  [facility]  [facility] [address] [place] [place], [place] [address] Phone: [phone] FAX: [phone]   CC: Lorenda Ishihara, MD [address] [place] [address] Phone: [phone]  Fax: [phone]  Return to Endocrinology clinic as below: Future Appointments  Date Time Provider Department Center  06/05/2022 11:10 AM Pranish Akhavan, Konrad Dolores, MD [facility] None  06/22/2022 11:00 AM Waymon Budge, MD [facility] None

## 2022-06-14 DIAGNOSIS — G9520 Unspecified cord compression: Secondary | ICD-10-CM | POA: Diagnosis not present

## 2022-06-14 DIAGNOSIS — E1151 Type 2 diabetes mellitus with diabetic peripheral angiopathy without gangrene: Secondary | ICD-10-CM | POA: Diagnosis not present

## 2022-06-14 DIAGNOSIS — B370 Candidal stomatitis: Secondary | ICD-10-CM | POA: Diagnosis not present

## 2022-06-14 DIAGNOSIS — I4719 Other supraventricular tachycardia: Secondary | ICD-10-CM | POA: Diagnosis not present

## 2022-06-19 ENCOUNTER — Ambulatory Visit: Payer: PPO | Admitting: Internal Medicine

## 2022-06-19 VITALS — BP 121/78 | HR 78 | Ht 63.5 in | Wt 237.0 lb

## 2022-06-19 DIAGNOSIS — E1165 Type 2 diabetes mellitus with hyperglycemia: Secondary | ICD-10-CM | POA: Diagnosis not present

## 2022-06-19 DIAGNOSIS — Z7984 Long term (current) use of oral hypoglycemic drugs: Secondary | ICD-10-CM | POA: Diagnosis not present

## 2022-06-19 DIAGNOSIS — E039 Hypothyroidism, unspecified: Secondary | ICD-10-CM

## 2022-06-19 LAB — TSH: TSH: 2.6 u[IU]/mL (ref 0.35–5.50)

## 2022-06-19 LAB — POCT GLYCOSYLATED HEMOGLOBIN (HGB A1C): Hemoglobin A1C: 7.3 % — AB (ref 4.0–5.6)

## 2022-06-19 MED ORDER — GLIMEPIRIDE 4 MG PO TABS: 6.0000 mg | ORAL_TABLET | Freq: Every day | ORAL | 3 refills | Status: DC

## 2022-06-19 NOTE — Progress Notes (Unsigned)
Name: Jill Dean  Age/ Sex: 77 y.o., female   MRN/ DOB: [id number], [birth year]     PCP: Lorenda Ishihara, MD   Reason for Endocrinology Evaluation: Type 2 Diabetes Mellitus  Initial Endocrine Consultative Visit: 04/18/2019    PATIENT IDENTIFIER: Jill Dean is a 77 y.o. female with a past medical history of T2DM, Hypothyroidism and Dyslipidemia. The patient has followed with Endocrinology clinic since 04/18/2019 for consultative assistance with management of her diabetes.  DIABETIC HISTORY:  Jill Dean was diagnosed with DM in 2013. Metformin - GI issues, Jardiance - recurrent yeast infection, Glipizide- Hives . Her hemoglobin A1c has ranged from 7.0% in 2015, peaking at 7.8% in 2020.  In her initial visit to our clinic she had an A1c of 7.0%, we stopped Jardiance due to recurrent yeast infections. She is allergic due to Glipizide  Stop Rybelsus March 2023 due to pruritus, we attempted again later in 2023 with similar results  SUBJECTIVE:   During the last visit (12/05/2021): A1c 7.1%        Today (06/19/2022): Jill Dean is here for a follow up on diabetes management.  She checks her blood sugars multiple  times daily. The patient has had hypoglycemic on freestyle libre but this is discordant with fingerstick  She continues to follow-up with cardiology for SVT  Denies constipation or diarrhea  Denies nausea  Rash is improving  Had thrush , medication on back order    HOME DIABETES REGIMEN:  Glimepiride 4 mg, 1 tab daily       Statin: Intolerant ACE-I/ARB: no    CONTINUOUS GLUCOSE MONITORING RECORD INTERPRETATION    Dates of Recording: 6/4 - 06/19/2022  Sensor description: [employer] 2  Results statistics:   CGM use % of time 94  Average and SD 147/16.9  Time in range   91 %  % Time Above 180 9  % Time above 250 0  % Time Below target 0   Glycemic patterns summary: Optimal BG's overnight and during the day except suppertime  Hyperglycemic  episodes postprandial  Hypoglycemic episodes occurred during the day  Overnight periods: Optimal    DIABETIC COMPLICATIONS: Microvascular complications:    Denies: CKD, retinopathy , neuropathy  Last eye exam: Completed 07/18/2021   Macrovascular complications:    Denies: CAD, PVD, CVA     HISTORY:  Past Medical History:  Past Medical History:  Diagnosis Date   Chest pain    a. s/p normal ett nuclear stress test on 10/16/13   Complication of anesthesia    COVID-19 12/2019   Diabetes mellitus without complication (HCC)    Dysrhythmia    fast heart rate   GERD (gastroesophageal reflux disease)    HLD (hyperlipidemia)    Hypothyroidism    Obesity    Osteoarthrosis, unspecified whether generalized or localized, unspecified site    Other chronic allergic conjunctivitis    PONV (postoperative nausea and vomiting)    Raynaud's syndrome    Urticaria, unspecified    Past Surgical History:  Past Surgical History:  Procedure Laterality Date   CATARACT EXTRACTION Bilateral 2023   CHOLECYSTECTOMY     CYST EXCISION Left 11/10/2014   Procedure: EXCISION CYST LEFT INDEX FINGER AND CYST LEFT SMALL FINGER ;  Surgeon: Cindee Salt, MD;  Location: [facility];  Service: Orthopedics;  Laterality: Left;   ESOPHAGOGASTRODUODENOSCOPY N/A 01/05/2014   Procedure: ESOPHAGOGASTRODUODENOSCOPY (EGD);  Surgeon: Charolett Bumpers, MD;  Location: [facility];  Service: Endoscopy;  Laterality: N/A;  knot right breast     LEFT HEART CATHETERIZATION WITH CORONARY ANGIOGRAM N/A 10/22/2013   Procedure: LEFT HEART CATHETERIZATION WITH CORONARY ANGIOGRAM;  Surgeon: Iran Ouch, MD;  Location: [facility];  Service: Cardiovascular;  Laterality: N/A;   OOPHORECTOMY     LSO 84-RSO 04   thyroid nodule     TONSILLECTOMY     VAGINAL HYSTERECTOMY  01/02/2002   LAVH RSO endometriosis   VESICOVAGINAL FISTULA CLOSURE W/ TAH     Social History:  reports that she has never smoked. She has  never used smokeless tobacco. She reports current alcohol use. She reports that she does not use drugs. Family History:  Family History  Problem Relation Age of Onset   Lung cancer Father    Heart disease Father    Heart failure Mother    Dementia Mother    Heart disease Maternal Uncle    Lung cancer Paternal Uncle    COPD Paternal Uncle      HOME MEDICATIONS: Allergies as of 06/19/2022       Reactions   Hydrocod Poli-chlorphe Poli Er Other (See Comments)   hyperactivity   Pseudoephedrine Itching, Palpitations, Other (See Comments)   REACTION: tachycardia   Sulfonamide Derivatives Anaphylaxis, Swelling   Azithromycin Itching   Buprenorphine Hcl Itching   Codeine Itching   Dilaudid [hydromorphone Hcl] Itching   Macrodantin [nitrofurantoin] Itching   Hands and feet itching. "Trouble breathing" w/lump in throat and stuffy nose.   Morphine And Codeine Itching   Shrimp Extract Itching   Shrimp [shellfish Allergy] Itching   Simvastatin Other (See Comments)   Fatigue, depression and dizziness at 20 mg   Atorvastatin    Muscle cramps  And her blood sugar kept going up   Cefuroxime    Other Reaction(s): swelling eyes   Cephalosporins Swelling   Said to cause facial swelling   Covid-19 (adenovirus) Vaccine    Other reaction(s): rash, itching, skin tight   Empagliflozin    Other reaction(s): yeast infection   Erythromycin Base Itching, Swelling   Hydrocod Poli-chlorphe Poli Er    Other reaction(s): flushing and facial edema   Hydrocodone-acetaminophen Other (See Comments)   Redness of face, swelling of tolerates tylenol   Iodine    Other Reaction(s): Unknown   Mercaptobenzothiazole    Other Reaction(s): Unknown   Metformin Hcl    Other reaction(s): epigastric pain   Metronidazole    Other reaction(s): tonsil swelling   Other    Rybelsus---anaphylaxsis   Penicillinase Itching   Quinolones    Other Reaction(s): Unknown   Semaglutide Itching   Statins    Other  Reaction(s): dizziness,felt depressed, myalgia, Other (See Comments) Muscle cramps , And her blood sugar kept going up   Sulfamethoxazole Itching   Clarithromycin Itching   REACTION: severe itching        Medication List        Accurate as of June 19, 2022 11:51 AM. If you have any questions, ask your nurse or doctor.          AMBULATORY NON FORMULARY MEDICATION Take 1 capsule by mouth daily at 6 (six) AM. Medication Name: recuperate IQ [employer]pper, beef liver powder, spirulina, tumeric, boron   amitriptyline 25 MG tablet Commonly known as: ELAVIL Take 25 mg by mouth at bedtime.   cetirizine 10 MG tablet Commonly known as: ZYRTEC Take 10 mg by mouth daily.   Cod Liver Oil Caps Take 1 capsule by mouth daily.   EPINEPHrine 0.3 mg/0.3  mL Soaj injection Commonly known as: EPI-PEN Inject 0.3 mg into the muscle as needed for anaphylaxis.   fluconazole 150 MG tablet Commonly known as: DIFLUCAN Take one tab po q other day x3 doses.   fluticasone 50 MCG/ACT nasal spray Commonly known as: FLONASE Place into both nostrils daily.   FreeStyle Libre 2 Sensor Misc 1 Device by Does not apply route every 14 (fourteen) days.   FREESTYLE LITE test strip Generic drug: glucose blood 1 each by Other route daily in the afternoon. Use as instructed   glimepiride 4 MG tablet Commonly known as: AMARYL Take 1 tablet (4 mg total) by mouth daily before breakfast.   ibuprofen 200 MG tablet Commonly known as: ADVIL Take 400 mg by mouth at bedtime as needed for moderate pain or mild pain.   levothyroxine 100 MCG tablet Commonly known as: SYNTHROID Take 100 mcg by mouth daily.   Lutein 20 MG Caps Take 1 capsule by mouth daily.   Magnesium 400 MG Caps Take 1 tablet by mouth daily.   metoprolol succinate 50 MG 24 hr tablet Commonly known as: TOPROL-XL TAKE 1 TABLET BY MOUTH EVERY DAY WITH OR IMMEDIATELY FOLLOWING A MEAL   MVW COMPLETE PROBIOTIC PO Take 1 capsule by mouth daily.    omeprazole 40 MG capsule Commonly known as: PRILOSEC Take by mouth.   YUMVS BEET ROOT-TART CHERRY PO Take 1 capsule by mouth daily at 6 (six) AM.         OBJECTIVE:   Vital Signs: BP 121/78 (BP Location: Left Arm, Patient Position: Sitting, Cuff Size: Normal)   Pulse 78   Ht 5' 3.5" (1.613 m)   Wt 237 lb (107.5 kg)   SpO2 98%   BMI 41.32 kg/m   Wt Readings from Last 3 Encounters:  06/19/22 237 lb (107.5 kg)  03/22/22 239 lb (108.4 kg)  12/07/21 234 lb (106.1 kg)     Exam: General: Pt appears well and is in NAD  Lungs: Clear with good BS bilat   Heart: RRR   Extremities: No pretibial edema.   Neuro: MS is good with appropriate affect, pt is alert and Ox3   DM foot exam:06/19/2022   The skin of the feet without sores or ulcerations. Plantar callous formation noted bilaterally  The pedal pulses are 2+ on right and 2+ on left. The sensation is intact to a screening 5.07, 10 gram monofilament bilaterally     DATA REVIEWED:  Lab Results  Component Value Date   HGBA1C 7.1 (A) 12/05/2021   HGBA1C 6.9 (A) 01/17/2021   HGBA1C 8.1 (A) 10/14/2020   03/22/2022 BUN 16 CR 0.950 GFR 62 HDL 54 LDL 135 MA/CR ratio 0.7 TG 323   ASSESSMENT / PLAN / RECOMMENDATIONS:   1) Type 2 Diabetes Mellitus, Optimally  controlled, Without complications - Most recent A1c of 7.3  %. Goal A1c < 7.0 %.    -A1c above goal -Patient with multiple allergic reaction/intolerance to multiple glycemic agents - Patient is intolerant to Jardiance due to recurrent vaginal yeast infections, and intolerant to Metformin as well as Glipizide - rash  - Intolerant to Rybelsus-rash  -I have recommended increasing glimepiride as below, as she has been noted with postprandial hyperglycemia on CGM download   MEDICATIONS:  -Increase glimepiride  4 mg to 1.5 tabs  daily   EDUCATION / INSTRUCTIONS: BG monitoring instructions: Patient is instructed to check her blood sugars 3 times a day. Call  [place] [facility] if: BG persistently < 70  2) Diabetic complications:  Eye: Does not  have known diabetic retinopathy.  Neuro/ Feet: Does not have known diabetic peripheral neuropathy .  Renal: Patient does not have known baseline CKD. She   is not on an ACEI/ARB at present.   3) Hypothyroidism:   - TSH is normal  - Continue Synthroid 100 mcg daily      F/U in 6 months    Signed electronically by: Lyndle Herrlich, MD  [facility]  [facility] [address] [place] [place], [place] [address] Phone: [phone] FAX: [phone]   CC: Lorenda Ishihara, MD [address] [place] [address] Phone: [phone]  Fax: [phone]  Return to Endocrinology clinic as below: Future Appointments  Date Time Provider Department Center  06/22/2022 11:00 AM Waymon Budge, MD [facility] None

## 2022-06-19 NOTE — Patient Instructions (Signed)
-   Change Glimepiride 4 mg, to one and a half tablets before breakfast      HOW TO TREAT LOW BLOOD SUGARS (Blood sugar LESS THAN 70 MG/DL) Please follow the RULE OF 15 for the treatment of hypoglycemia treatment (when your (blood sugars are less than 70 mg/dL)   STEP 1: Take 15 grams of carbohydrates when your blood sugar is low, which includes:  3-4 GLUCOSE TABS  OR 3-4 OZ OF JUICE OR REGULAR SODA OR ONE TUBE OF GLUCOSE GEL    STEP 2: RECHECK blood sugar in 15 MINUTES STEP 3: If your blood sugar is still low at the 15 minute recheck --> then, go back to STEP 1 and treat AGAIN with another 15 grams of carbohydrates.

## 2022-06-20 ENCOUNTER — Telehealth: Payer: Self-pay | Admitting: Physical Medicine and Rehabilitation

## 2022-06-20 ENCOUNTER — Encounter: Payer: Self-pay | Admitting: Internal Medicine

## 2022-06-20 NOTE — Progress Notes (Deleted)
    Patient ID: Jill Dean, female    DOB: October 09, 1945, 77 y.o.   MRN: [id number]  HPI female never smoker followed for allergic rhinitis/conjunctivitis, complicated by history Raynaud's syndrome, GERD, urticaria, DM, PSVT  --------------------------------------------------------------------------------------------------   06/20/21- 77 year old female never smoker followed for Allergic Rhinitis/conjunctivitis, complicated by history Raynaud's syndrome, GERD, Urticaria, DM2, Hypothyroid, Dyslipidemia, HTN, PSVT, Covid infection Dec 2021, Hyperlipidemia, Obesity,  -Zyrtec, Flonase, Astelin Covid vax 1 Phizer -----Problems swallowing. Feels like something is in her throat Describes dysphagia and hoarseness as very gradually worse over past year. Asks about allergy testing for this. Doesn't notice reflux, heartburn.Doesn't think it is wheezing or coughing.  06/22/22- 77 year old female never smoker followed for Allergic Rhinitis/conjunctivitis, complicated by history Raynaud's syndrome, GERD, Urticaria, DM2, Hypothyroid, Dyslipidemia, HTN, PSVT, Covid infection Dec 2021, Hyperlipidemia, Obesity,  -Zyrtec, Flonase, Astelin  CXR 02/14/22- LEFT RIBS AND CHEST - 3+ VIEW  IMPRESSION: Negative   Review of Systems-See HPI + = positive Constitutional:   No-   weight loss, night sweats, fevers, chills, fatigue, lassitude. HEENT:    headaches, +difficulty swallowing, tooth/dental problems, +sore throat,       sneezing, itching, ear ache, +nasal congestion, +post nasal drip,  CV:  No-   chest pain, orthopnea, PND, swelling in lower extremities, anasarca,  dizziness, palpitations Resp: No-   shortness of breath with exertion or at rest.            -productive cough,  No non-productive cough,  No-  coughing up of blood.              -change in color of mucus.  No- wheezing.    Objective:   Physical Exam General- Alert, Oriented, Affect-appropriate, Distress- none acute  +Obese Skin- +faint erythema  butterfly distribution Lymphadenopathy- none Head- atraumatic            Eyes- Gross vision intact, PERRLA, conjunctivae clear secretions, +periorbital edema            Ears- Hearing,             Nose-  +turbinate edema , No-Septal dev,  polyps, erosion, perforation,             Throat- Mallampati III-IV , mucosa clear-not red , drainage- none, tonsils- atrophic., + raspy voice  Neck- flexible , trachea midline, no stridor , thyroid nl, carotid no bruit Chest -            Lung- clear to P&A but diminished, wheeze- none, cough- none , dullness-none, rub- none           Cardiac- RRR           Chest wall-  Abd-  Br/ Gen/ Rectal- Not done, not indicated Extrem- cyanosis- none, clubbing, none, atrophy- none, strength- nl, + superficial varices Neuro- grossly intact to observation

## 2022-06-20 NOTE — Telephone Encounter (Signed)
Patient called. Would like an appointment with Dr. Newton 

## 2022-06-21 ENCOUNTER — Telehealth: Payer: Self-pay | Admitting: Physical Medicine and Rehabilitation

## 2022-06-21 DIAGNOSIS — M47816 Spondylosis without myelopathy or radiculopathy, lumbar region: Secondary | ICD-10-CM

## 2022-06-21 MED ORDER — LEVOTHYROXINE SODIUM 100 MCG PO TABS: 100.0000 ug | ORAL_TABLET | Freq: Every day | ORAL | 3 refills | Status: DC

## 2022-06-21 NOTE — Telephone Encounter (Signed)
Patient is interested in Methocarbamol for pack pain please advise if she can take it with all her allergies, also when setting appts she would like [place] to call her on her cell phone so she does not miss the call if not home please advise

## 2022-06-22 ENCOUNTER — Ambulatory Visit: Payer: HMO | Admitting: Internal Medicine

## 2022-06-23 NOTE — Telephone Encounter (Signed)
Spoke with patient and she is requesting an injection. She is having the same pain and the last injection in 11/2021 lasted until recently with more than 75%relief. No new falls, accidents or injuries. Please advise

## 2022-06-23 NOTE — Addendum Note (Signed)
Addended by: Sharlet Salina on: 06/23/2022 10:17 AM   Modules accepted: Orders

## 2022-06-23 NOTE — Telephone Encounter (Signed)
See previous encounter

## 2022-07-05 ENCOUNTER — Telehealth: Payer: Self-pay | Admitting: Physical Medicine and Rehabilitation

## 2022-07-05 DIAGNOSIS — S6000XA Contusion of unspecified finger without damage to nail, initial encounter: Secondary | ICD-10-CM | POA: Diagnosis not present

## 2022-07-05 DIAGNOSIS — S6701XA Crushing injury of right thumb, initial encounter: Secondary | ICD-10-CM | POA: Diagnosis not present

## 2022-07-05 DIAGNOSIS — S62244A Nondisplaced fracture of shaft of first metacarpal bone, right hand, initial encounter for closed fracture: Secondary | ICD-10-CM | POA: Diagnosis not present

## 2022-07-05 DIAGNOSIS — W231XXA Caught, crushed, jammed, or pinched between stationary objects, initial encounter: Secondary | ICD-10-CM | POA: Diagnosis not present

## 2022-07-05 DIAGNOSIS — M79644 Pain in right finger(s): Secondary | ICD-10-CM | POA: Diagnosis not present

## 2022-07-05 NOTE — Telephone Encounter (Signed)
Appointment cancelled

## 2022-07-05 NOTE — Telephone Encounter (Signed)
Patient called. Says she does not need the injection. She is better.

## 2022-07-11 ENCOUNTER — Encounter: Payer: PPO | Admitting: Physical Medicine and Rehabilitation

## 2022-07-11 ENCOUNTER — Other Ambulatory Visit (INDEPENDENT_AMBULATORY_CARE_PROVIDER_SITE_OTHER): Payer: PPO

## 2022-07-11 ENCOUNTER — Encounter: Payer: Self-pay | Admitting: Physician Assistant

## 2022-07-11 ENCOUNTER — Ambulatory Visit (INDEPENDENT_AMBULATORY_CARE_PROVIDER_SITE_OTHER): Payer: PPO | Admitting: Physician Assistant

## 2022-07-11 DIAGNOSIS — M79641 Pain in right hand: Secondary | ICD-10-CM

## 2022-07-11 NOTE — Progress Notes (Signed)
Office Visit Note   Patient: Jill Dean           Date of Birth: Dec 09, 1945           MRN: [id number] Visit Date: 07/11/2022              Requested by: Jill Ishihara, MD 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  She said that approximately 6 days ago in a parking lot she accidentally slammed her hand in her car door.  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  She did have an x-ray done in [place] which demonstrated per her report a nondisplaced fracture in her thumb.  She has been in a thumb spica plant splint.  Has some numbness in her index finger.  Rates her pain is moderate  Assessment & Plan: Visit Diagnoses:  1. Pain in right hand     Plan: X-rays today do reveal probably a nondisplaced fracture of her phalanx of the thumb.  She has this on top of significant degenerative changes.  She does have some swelling in her fingers but she said this is baseline for her.  She is neurovascularly intact and her fingers are warm.  Has some slight paresthesias in the index finger but again otherwise intact.  She is going to continue to wear the thumb spica splint may reevaluate in 2 weeks  Follow-Up Instructions: 2 weeks  Ortho Exam  Patient is alert, oriented, no adenopathy, well-dressed, normal affect, normal respiratory effort. Examination of her right hand she has strong radial pulse she has brisk capillary refill of less than 2 seconds in all of her digits she does have some deviation deformities in her index fingers but per her report this is baseline.  As well as some of the swelling.  In her thumb she does have some ecchymosis at the DIP joint.  Again good  capillary refill.  Imaging: XR Hand Complete Right  Result Date: 07/11/2022 Radiographs were taken of the right hand today in multiple projections.  She has advanced degenerative changes in all of her digits.  Is questionable nondisplaced fracture at the base of the distal phalanx of the thumb.  No images are attached to the encounter.  Labs: Lab Results  Component Value Date   HGBA1C 7.3 (A) 06/19/2022   HGBA1C 7.1 (A) 12/05/2021   HGBA1C 6.9 (A) 01/17/2021   ESRSEDRATE 9 06/15/2020   LABORGA Multiple bacterial morphotypes present, none 06/03/2014   LABORGA predominant. Suggest appropriate recollection if 06/03/2014   LABORGA clinically indicated. 06/03/2014     No results found for: "ALBUMIN", "PREALBUMIN", "CBC"  No results found for: "MG" No results found for: "VD25OH"  No results found for: "PREALBUMIN"    Latest Ref Rng & Units 08/20/2020    2:43 PM 06/15/2020   11:57 AM 12/13/2013    4:51 PM  CBC EXTENDED  WBC 4.0 - 10.5 K/uL 8.5  6.3  8.5   RBC 3.87 - 5.11 MIL/uL 4.81  4.55  4.63   Hemoglobin 12.0 - 15.0 g/dL 62.9  52.8  41.3   HCT 36.0 - 46.0 %  42.1  38.8  40.1   Platelets 150 - 400 K/uL 252  236.0  250   NEUT# 1.7 - 7.7 K/uL 4.7  3.2    Lymph# 0.7 - 4.0 K/uL 2.9  2.4       There is no height or weight on file to calculate BMI.  Orders:  Orders Placed This Encounter  Procedures   XR Hand Complete Right   No orders of the defined types were placed in this encounter.    Procedures: No procedures performed  Clinical Data: No additional findings.  ROS:  All other systems negative, except as noted in the HPI. Review of Systems  Objective: Vital Signs: There were no vitals taken for this visit.  Specialty Comments:  MRI LUMBAR SPINE WITHOUT CONTRAST   TECHNIQUE: Multiplanar, multisequence MR imaging of the lumbar spine was performed. No intravenous contrast was administered.   COMPARISON:  Radiographs dated February 21, 2021    FINDINGS: Segmentation:  Standard.   Alignment:  4 mm anterolisthesis of L4.   Vertebrae: No fracture, evidence of discitis, or bone lesion. Likely small hemangioma in T12 vertebral body.   Conus medullaris and cauda equina: Conus extends to the L1 level. Conus and cauda equina appear normal.   Paraspinal and other soft tissues: Nonspecific subcutaneous soft tissue edema.   Disc spaces:   T12-L1: No significant disc bulge. No neural foraminal stenosis. No central canal stenosis. Mild facet joint arthropathy   L1-L2: No significant disc bulge. Mild ligamentum flavum hypertrophy. No neural foraminal stenosis. No central canal stenosis. Mild facet joint arthropathy Jill Dean.   L2-L3: Mild circumferential disc protrusion and ligamentum flavum hypertrophy with narrowing of spinal canal. Mild facet joint arthropathy. No significant neural foraminal narrowing.   L3-L4: Circumferential disc protrusion and ligamentum flavum hypertrophy with mild-to-moderate narrowing of spinal canal. Mild narrowing of bilateral lateral recesses. Moderate facet joint arthropathy. No significant neural foraminal narrowing.   L4-L5: Moderate circumferential disc protrusion and ligamentum flavum hypertrophy with narrowing of spinal canal. Narrowing of lateral recesses bilaterally with encroachment of the L5 nerve roots bilaterally. Moderate facet joint arthropathy, left worse than the right. No significant neural foraminal narrowing.   L5-S1: Moderate disc height loss with circumferential disc protrusion and subarticular narrowing bilaterally. Encroachment of the bilateral S1 nerve roots. Moderate facet joint arthropathy. Mild bilateral neural foraminal narrowing at L5.   IMPRESSION: 1.  No evidence of acute fracture or subluxation.   2. 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     Electronically Signed   By: Jill Dean D.O.   On: 03/06/2021  14:42  PMFS History: Patient Active Problem List   Diagnosis Date Noted   Impingement syndrome of right shoulder 11/02/2021   Hoarseness 07/31/2021   Rash and nonspecific skin eruption 11/07/2020   Type 2 diabetes mellitus with hyperglycemia, without long-term current use of insulin (HCC) 10/15/2020   Hypertriglyceridemia 04/18/2019   Closed nondisplaced fracture of proximal phalanx of lesser toe of right foot 06/05/2017   Non-suppurative otitis media 05/19/2015   PSVT (paroxysmal supraventricular tachycardia) 10/29/2013   Diabetes mellitus with coincident hypertension (HCC) 10/29/2013   Type 2 diabetes mellitus without complication (HCC) 10/29/2013   History of tachycardia 10/22/2013   RBBB (right bundle branch block) 10/22/2013   Drug effect 10/22/2013   Chest pain 10/16/2013   Palpitations 10/16/2013   DM (diabetes mellitus) (HCC)    Obesity    HLD (hyperlipidemia)    SVT (supraventricular tachycardia) 10/15/2013  Hypothyroidism    Endometriosis    RHINOSINUSITIS, ACUTE 02/08/2009   GERD 02/11/2007   Raynaud's syndrome 02/07/2007   Seasonal and perennial allergic rhinitis 02/07/2007   URTICARIA 02/07/2007   OSTEOARTHRITIS 02/07/2007   Past Medical History:  Diagnosis Date   Chest pain    a. s/p normal ett nuclear stress test on 10/16/13   Complication of anesthesia    COVID-19 12/2019   Diabetes mellitus without complication (HCC)    Dysrhythmia    fast heart rate   GERD (gastroesophageal reflux disease)    HLD (hyperlipidemia)    Hypothyroidism    Obesity    Osteoarthrosis, unspecified whether generalized or localized, unspecified site    Other chronic allergic conjunctivitis    PONV (postoperative nausea and vomiting)    Raynaud's syndrome    Urticaria, unspecified     Family History  Problem Relation Age of Onset   Lung cancer Father    Heart disease Father    Heart failure Mother    Dementia Mother    Heart disease Maternal Uncle    Lung cancer  Paternal Uncle    COPD Paternal Uncle     Past Surgical History:  Procedure Laterality Date   CATARACT EXTRACTION Bilateral 2023   CHOLECYSTECTOMY     CYST EXCISION Left 11/10/2014   Procedure: EXCISION CYST LEFT INDEX FINGER AND CYST LEFT SMALL FINGER ;  Surgeon: Cindee Salt, MD;  Location: [facility];  Service: Orthopedics;  Laterality: Left;   ESOPHAGOGASTRODUODENOSCOPY N/A 01/05/2014   Procedure: ESOPHAGOGASTRODUODENOSCOPY (EGD);  Surgeon: Charolett Bumpers, MD;  Location: [facility];  Service: Endoscopy;  Laterality: N/A;   knot right breast     LEFT HEART CATHETERIZATION WITH CORONARY ANGIOGRAM N/A 10/22/2013   Procedure: LEFT HEART CATHETERIZATION WITH CORONARY ANGIOGRAM;  Surgeon: Iran Ouch, MD;  Location: [facility];  Service: Cardiovascular;  Laterality: N/A;   OOPHORECTOMY     LSO 84-RSO 04   thyroid nodule     TONSILLECTOMY     VAGINAL HYSTERECTOMY  01/02/2002   LAVH RSO endometriosis   VESICOVAGINAL FISTULA CLOSURE W/ TAH     Social History   Occupational History   Occupation: Beautician  Tobacco Use   Smoking status: Never   Smokeless tobacco: Never  Vaping Use   Vaping Use: Never used  Substance and Sexual Activity   Alcohol use: Yes    Alcohol/week: 0.0 standard drinks of alcohol    Comment: RARE   Drug use: No   Sexual activity: Not Currently    Birth control/protection: Surgical, Post-menopausal    Comment: Hyst; First IC (age unknown), <5 [employer]

## 2022-07-25 DIAGNOSIS — M8580 Other specified disorders of bone density and structure, unspecified site: Secondary | ICD-10-CM | POA: Diagnosis not present

## 2022-07-25 DIAGNOSIS — I10 Essential (primary) hypertension: Secondary | ICD-10-CM | POA: Diagnosis not present

## 2022-07-25 DIAGNOSIS — Z9849 Cataract extraction status, unspecified eye: Secondary | ICD-10-CM | POA: Diagnosis not present

## 2022-07-25 DIAGNOSIS — J309 Allergic rhinitis, unspecified: Secondary | ICD-10-CM | POA: Diagnosis not present

## 2022-07-25 DIAGNOSIS — E039 Hypothyroidism, unspecified: Secondary | ICD-10-CM | POA: Diagnosis not present

## 2022-07-25 DIAGNOSIS — M1990 Unspecified osteoarthritis, unspecified site: Secondary | ICD-10-CM | POA: Diagnosis not present

## 2022-07-25 DIAGNOSIS — G8929 Other chronic pain: Secondary | ICD-10-CM | POA: Diagnosis not present

## 2022-07-25 DIAGNOSIS — Z6841 Body Mass Index (BMI) 40.0 and over, adult: Secondary | ICD-10-CM | POA: Diagnosis not present

## 2022-07-25 DIAGNOSIS — K219 Gastro-esophageal reflux disease without esophagitis: Secondary | ICD-10-CM | POA: Diagnosis not present

## 2022-07-25 DIAGNOSIS — E119 Type 2 diabetes mellitus without complications: Secondary | ICD-10-CM | POA: Diagnosis not present

## 2022-07-25 DIAGNOSIS — G4700 Insomnia, unspecified: Secondary | ICD-10-CM | POA: Diagnosis not present

## 2022-07-26 ENCOUNTER — Ambulatory Visit: Payer: PPO | Admitting: Physician Assistant

## 2022-07-26 ENCOUNTER — Encounter: Payer: Self-pay | Admitting: Physician Assistant

## 2022-07-26 DIAGNOSIS — M79641 Pain in right hand: Secondary | ICD-10-CM | POA: Diagnosis not present

## 2022-07-26 NOTE — Progress Notes (Signed)
Office Visit Note   Patient: Jill Dean           Date of Birth: 07/15/1945           MRN: [id number] Visit Date: 07/26/2022              Requested by: Lorenda Ishihara, MD [address] [place],  [place] [address] PCP: Lorenda Ishihara, MD  Chief Complaint  Patient presents with   Right Hand - Follow-up      HPI: Patient is a pleasant 77 year old woman who is right-hand dominant is now 4 weeks status post slamming her thumb and her hand in a car door.  She was seen and evaluated and the thought was that she had a nondisplaced fracture of the the base of the distal phalanx.  I did give her a thumb spica and she seems to be doing better.  Which she is complaining of this onset of numbness which has gone on since she last saw me along the radial side of her index finger.  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  She does have baseline deformities of her index fingers but thinks the right hand has more swelling in the index finger.  She denies any fever chills or any redness.  She is having trouble opposing her thumb and her index finger which normally she can do  Assessment & Plan: Visit Diagnoses: Right hand pain  Plan: I had a long discussion with her today.  She does have baseline valgus deformities of her index finger secondary to her arthritis which is baseline.  I do not see any sign of infection no skin compromise.  She has good capillary refill and her finger is warm and pink.  No cellulitis.  She does not remember exactly what happened to the index finger during this event.  Her thumb is doing much better.  We discussed this could possibly be from tightening the thumb spica splint too tightly though she has tried to loosen it.  I told her to continue use the thumb spica as needed or when she is out and about but to try to take it off.  Given her ongoing  difficulties I have referred her to our hand specialist.  She should follow-up with him in 2 or 3 weeks.  Given the arthropathy in her hands I think it would be important for her to establish with him.  She does have a history of diabetes and her last hemoglobin A1c was 7.3.  She has been given strict return precautions should she have any concerns to contact me before this appointment  Follow-Up Instructions: Return in about 2 weeks (around 08/09/2022). With hand  Ortho Exam  Patient is alert, oriented, no adenopathy, well-dressed, normal affect, normal respiratory effort. Examination of her hand she does have baseline deformities at the PIP joints with noted valgus deformity of both index fingers angulation is not any worse on the right compared to the left though does have some more swelling there is no drainage no cellulitis no erythema.  She has brisk capillary refill palp pulses are palpable she does have altered feeling especially on the medial side of the index finger she can extend and flex the finger though not as effectively as her opposite hand.  She also has difficulty with opposition to her thumb  Imaging: No results found. No images are attached  to the encounter.  Labs: Lab Results  Component Value Date   HGBA1C 7.3 (A) 06/19/2022   HGBA1C 7.1 (A) 12/05/2021   HGBA1C 6.9 (A) 01/17/2021   ESRSEDRATE 9 06/15/2020   LABORGA Multiple bacterial morphotypes present, none 06/03/2014   LABORGA predominant. Suggest appropriate recollection if 06/03/2014   LABORGA clinically indicated. 06/03/2014     No results found for: "ALBUMIN", "PREALBUMIN", "CBC"  No results found for: "MG" No results found for: "VD25OH"  No results found for: "PREALBUMIN"    Latest Ref Rng & Units 08/20/2020    2:43 PM 06/15/2020   11:57 AM 12/13/2013    4:51 PM  CBC EXTENDED  WBC 4.0 - 10.5 K/uL 8.5  6.3  8.5   RBC 3.87 - 5.11 MIL/uL 4.81  4.55  4.63   Hemoglobin 12.0 - 15.0 g/dL 40.1  02.7  25.3    HCT 36.0 - 46.0 % 42.1  38.8  40.1   Platelets 150 - 400 K/uL 252  236.0  250   NEUT# 1.7 - 7.7 K/uL 4.7  3.2    Lymph# 0.7 - 4.0 K/uL 2.9  2.4       There is no height or weight on file to calculate BMI.  Orders:  Orders Placed This Encounter  Procedures   Ambulatory referral to Orthopedic Surgery   No orders of the defined types were placed in this encounter.    Procedures: No procedures performed  Clinical Data: No additional findings.  ROS:  All other systems negative, except as noted in the HPI. Review of Systems  Objective: Vital Signs: There were no vitals taken for this visit.  Specialty Comments:  MRI LUMBAR SPINE WITHOUT CONTRAST   TECHNIQUE: Multiplanar, multisequence MR imaging of the lumbar spine was performed. No intravenous contrast was administered.   COMPARISON:  Radiographs dated February 21, 2021   FINDINGS: Segmentation:  Standard.   Alignment:  4 mm anterolisthesis of L4.   Vertebrae: No fracture, evidence of discitis, or bone lesion. Likely small hemangioma in T12 vertebral body.   Conus medullaris and cauda equina: Conus extends to the L1 level. Conus and cauda equina appear normal.   Paraspinal and other soft tissues: Nonspecific subcutaneous soft tissue edema.   Disc spaces:   T12-L1: No significant disc bulge. No neural foraminal stenosis. No central canal stenosis. Mild facet joint arthropathy   L1-L2: No significant disc bulge. Mild ligamentum flavum hypertrophy. No neural foraminal stenosis. No central canal stenosis. Mild facet joint arthropathy [name].   L2-L3: Mild circumferential disc protrusion and ligamentum flavum hypertrophy with narrowing of spinal canal. Mild facet joint arthropathy. No significant neural foraminal narrowing.   L3-L4: Circumferential disc protrusion and ligamentum flavum hypertrophy with mild-to-moderate narrowing of spinal canal. Mild narrowing of bilateral lateral recesses. Moderate facet  joint arthropathy. No significant neural foraminal narrowing.   L4-L5: Moderate circumferential disc protrusion and ligamentum flavum hypertrophy with narrowing of spinal canal. Narrowing of lateral recesses bilaterally with encroachment of the L5 nerve roots bilaterally. Moderate facet joint arthropathy, left worse than the right. No significant neural foraminal narrowing.   L5-S1: Moderate disc height loss with circumferential disc protrusion and subarticular narrowing bilaterally. Encroachment of the bilateral S1 nerve roots. Moderate facet joint arthropathy. Mild bilateral neural foraminal narrowing at L5.   IMPRESSION: 1.  No evidence of acute fracture or subluxation.   2. 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     Electronically Signed   By: Leona Carry  Ahmed D.O.   On: 03/06/2021 14:42  PMFS History: Patient Active Problem List   Diagnosis Date Noted   Impingement syndrome of right shoulder 11/02/2021   Hoarseness 07/31/2021   Rash and nonspecific skin eruption 11/07/2020   Type 2 diabetes mellitus with hyperglycemia, without long-term current use of insulin (HCC) 10/15/2020   Hypertriglyceridemia 04/18/2019   Closed nondisplaced fracture of proximal phalanx of lesser toe of right foot 06/05/2017   Non-suppurative otitis media 05/19/2015   PSVT (paroxysmal supraventricular tachycardia) 10/29/2013   Diabetes mellitus with coincident hypertension (HCC) 10/29/2013   Type 2 diabetes mellitus without complication (HCC) 10/29/2013   History of tachycardia 10/22/2013   RBBB (right bundle branch block) 10/22/2013   Drug effect 10/22/2013   Chest pain 10/16/2013   Palpitations 10/16/2013   DM (diabetes mellitus) (HCC)    Obesity    HLD (hyperlipidemia)    SVT (supraventricular tachycardia) 10/15/2013   Hypothyroidism    Endometriosis    RHINOSINUSITIS, ACUTE 02/08/2009   GERD 02/11/2007   Raynaud's syndrome 02/07/2007   Seasonal  and perennial allergic rhinitis 02/07/2007   URTICARIA 02/07/2007   OSTEOARTHRITIS 02/07/2007   Past Medical History:  Diagnosis Date   Chest pain    a. s/p normal ett nuclear stress test on 10/16/13   Complication of anesthesia    COVID-19 12/2019   Diabetes mellitus without complication (HCC)    Dysrhythmia    fast heart rate   GERD (gastroesophageal reflux disease)    HLD (hyperlipidemia)    Hypothyroidism    Obesity    Osteoarthrosis, unspecified whether generalized or localized, unspecified site    Other chronic allergic conjunctivitis    PONV (postoperative nausea and vomiting)    Raynaud's syndrome    Urticaria, unspecified     Family History  Problem Relation Age of Onset   Lung cancer Father    Heart disease Father    Heart failure Mother    Dementia Mother    Heart disease Maternal Uncle    Lung cancer Paternal Uncle    COPD Paternal Uncle     Past Surgical History:  Procedure Laterality Date   CATARACT EXTRACTION Bilateral 2023   CHOLECYSTECTOMY     CYST EXCISION Left 11/10/2014   Procedure: EXCISION CYST LEFT INDEX FINGER AND CYST LEFT SMALL FINGER ;  Surgeon: Cindee Salt, MD;  Location: [facility];  Service: Orthopedics;  Laterality: Left;   ESOPHAGOGASTRODUODENOSCOPY N/A 01/05/2014   Procedure: ESOPHAGOGASTRODUODENOSCOPY (EGD);  Surgeon: Charolett Bumpers, MD;  Location: [facility];  Service: Endoscopy;  Laterality: N/A;   knot right breast     LEFT HEART CATHETERIZATION WITH CORONARY ANGIOGRAM N/A 10/22/2013   Procedure: LEFT HEART CATHETERIZATION WITH CORONARY ANGIOGRAM;  Surgeon: Iran Ouch, MD;  Location: [facility];  Service: Cardiovascular;  Laterality: N/A;   OOPHORECTOMY     LSO 84-RSO 04   thyroid nodule     TONSILLECTOMY     VAGINAL HYSTERECTOMY  01/02/2002   LAVH RSO endometriosis   VESICOVAGINAL FISTULA CLOSURE W/ TAH     Social History   Occupational History   Occupation: Beautician  Tobacco Use   Smoking  status: Never   Smokeless tobacco: Never  Vaping Use   Vaping status: Never Used  Substance and Sexual Activity   Alcohol use: Yes    Alcohol/week: 0.0 standard drinks of alcohol    Comment: RARE   Drug use: No   Sexual activity: Not Currently    Birth control/protection: Surgical,  Post-menopausal    Comment: Hyst; First IC (age unknown), <5 [employer]

## 2022-08-01 ENCOUNTER — Ambulatory Visit: Payer: HMO | Admitting: Internal Medicine

## 2022-08-11 ENCOUNTER — Ambulatory Visit (INDEPENDENT_AMBULATORY_CARE_PROVIDER_SITE_OTHER): Payer: PPO | Admitting: Orthopedic Surgery

## 2022-08-11 DIAGNOSIS — M79641 Pain in right hand: Secondary | ICD-10-CM | POA: Diagnosis not present

## 2022-08-11 NOTE — Progress Notes (Signed)
Jill Dean - 77 y.o. female MRN [id number]  Date of birth: 01/16/1945  Office Visit Note: Visit Date: 08/11/2022 PCP: Lorenda Ishihara, MD Referred by: Persons, [place], [place]  Subjective: Chief Complaint  Patient presents with   Right Hand - Pain   HPI: Jill Dean is a pleasant 77 y.o. female who presents today for evaluation of ongoing right basilar thumb pain that has been present for multiple years.  She also has noted significant deformity of the right index finger PIP joint with ulnar deviation and associated pain and swelling.  She has not undergone any formalized treatment for either issue.  She also does describe numbness and tingling particularly of the thumb and index finger which is progressive in nature.  Visit Reason: right hand / thumb Hand dominance: right Occupation: Retired Diabetic: Yes/ 7.3 Heart/Lung History: none Blood Thinners: none  Prior Testing/EMG: xray 07/11/22 Injections (Date): none Treatments: none Prior Surgery: none   *July 05, 2022 fractured right thumb* *index finger swelling, pain, tingling* *CMC type pain*  Pertinent ROS were reviewed with the patient and found to be negative unless otherwise specified above in HPI.   Assessment & Plan: Visit Diagnoses:  1. Pain in right hand     Plan: Extensive discussion was had with patient today regarding her right hand complaints.  I reviewed her imaging which is consistent with right thumb CMC osteoarthritis as well as significant right index finger PIP joint arthritis.  We discussed treat modalities ranging from conservative to surgical for both of these conditions.  As far as the right index finger, we did discuss the possibility of arthrodesis versus arthroplasty as well as risks and benefits.  For the right thumb CMC, we discussed bracing, injections and potential surgical intervention in the form of [facility] arthroplasty in the future should her symptoms become refractory to conservative  care.  As far as the numbness and tingling in the right hand, I would like to further investigate this with electrodiagnostic testing of the right upper extremity.  Referral will be placed today for extremity EMG, she will follow-up with me after this test is complete to review results.  Comfort Cool brace for the right thumb was fitted today as well for symptom relief.  Should her symptoms progress at the right thumb basal joint, we can consider cortisone injection in the future.  Patient expressed full understanding.  Follow-up: No follow-ups on file.   Meds & Orders: No orders of the defined types were placed in this encounter.   Orders Placed This Encounter  Procedures   Ambulatory referral to Physical Medicine Rehab     Procedures: No procedures performed      Clinical History: MRI LUMBAR SPINE WITHOUT CONTRAST   TECHNIQUE: Multiplanar, multisequence MR imaging of the lumbar spine was performed. No intravenous contrast was administered.   COMPARISON:  Radiographs dated February 21, 2021   FINDINGS: Segmentation:  Standard.   Alignment:  4 mm anterolisthesis of L4.   Vertebrae: No fracture, evidence of discitis, or bone lesion. Likely small hemangioma in T12 vertebral body.   Conus medullaris and cauda equina: Conus extends to the L1 level. Conus and cauda equina appear normal.   Paraspinal and other soft tissues: Nonspecific subcutaneous soft tissue edema.   Disc spaces:   T12-L1: No significant disc bulge. No neural foraminal stenosis. No central canal stenosis. Mild facet joint arthropathy   L1-L2: No significant disc bulge. Mild ligamentum flavum hypertrophy. No neural foraminal stenosis. No central  canal stenosis. Mild facet joint arthropathy [name].   L2-L3: Mild circumferential disc protrusion and ligamentum flavum hypertrophy with narrowing of spinal canal. Mild facet joint arthropathy. No significant neural foraminal narrowing.   L3-L4:  Circumferential disc protrusion and ligamentum flavum hypertrophy with mild-to-moderate narrowing of spinal canal. Mild narrowing of bilateral lateral recesses. Moderate facet joint arthropathy. No significant neural foraminal narrowing.   L4-L5: Moderate circumferential disc protrusion and ligamentum flavum hypertrophy with narrowing of spinal canal. Narrowing of lateral recesses bilaterally with encroachment of the L5 nerve roots bilaterally. Moderate facet joint arthropathy, left worse than the right. No significant neural foraminal narrowing.   L5-S1: Moderate disc height loss with circumferential disc protrusion and subarticular narrowing bilaterally. Encroachment of the bilateral S1 nerve roots. Moderate facet joint arthropathy. Mild bilateral neural foraminal narrowing at L5.   IMPRESSION: 1.  No evidence of acute fracture or subluxation.   2. 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     Electronically Signed   By: Larose Hires D.O.   On: 03/06/2021 14:42  She reports that she has never smoked. She has never used smokeless tobacco.  Recent Labs    12/05/21 1219 06/19/22 1155  HGBA1C 7.1* 7.3*    Objective:   Vital Signs: There were no vitals taken for this visit.  Physical Exam  Gen: Well-appearing, in no acute distress; non-toxic CV: Regular Rate. Well-perfused. Warm.  Resp: Breathing unlabored on room air; no wheezing. Psych: Fluid speech in conversation; appropriate affect; normal thought process Neuro: Sensation intact throughout. No gross coordination deficits.   Ortho Exam General: Patient is well appearing and in no distress. Cervical spine mobility is full in all directions:   Skin and Muscle: No skin changes are apparent to upper extremities.  Muscle bulk and contour normal, no signs of atrophy.      Range of Motion and Palpation Tests: Mobility is full about the elbows with flexion and extension.  Forearm  supination and pronation are 85/85 bilaterally.  Wrist flexion/extension is 75/65 bilaterally.    No cords or nodules are palpated.  No triggering is observed.    Notable swelling over the right index PIP with ulnar deviation, there is associated tenderness Right index finger limited range of motion, PIP 5-45   Significant tenderness over the right thumb CMC articulation is observed, positive grind, positive crepitus.  MP hyperextension approximately 20 degrees.    Finklestein test negative bilaterally   Neurologic, Vascular, Motor: Sensation is slightly diminished to light touch in the median distribution right side.  Tinel's testing positive at wrist level right. Phalen's equivocal right  Fingers pink and well perfused.  Capillary refill is brisk.     Imaging: No results found.  Past Medical/Family/Surgical/Social History: Medications & Allergies reviewed per EMR, new medications updated. Patient Active Problem List   Diagnosis Date Noted   Impingement syndrome of right shoulder 11/02/2021   Hoarseness 07/31/2021   Rash and nonspecific skin eruption 11/07/2020   Type 2 diabetes mellitus with hyperglycemia, without long-term current use of insulin (HCC) 10/15/2020   Hypertriglyceridemia 04/18/2019   Closed nondisplaced fracture of proximal phalanx of lesser toe of right foot 06/05/2017   Non-suppurative otitis media 05/19/2015   PSVT (paroxysmal supraventricular tachycardia) 10/29/2013   Diabetes mellitus with coincident hypertension (HCC) 10/29/2013   Type 2 diabetes mellitus without complication (HCC) 10/29/2013   History of tachycardia 10/22/2013   RBBB (right bundle branch block) 10/22/2013   Drug effect 10/22/2013   Chest  pain 10/16/2013   Palpitations 10/16/2013   DM (diabetes mellitus) (HCC)    Obesity    HLD (hyperlipidemia)    SVT (supraventricular tachycardia) 10/15/2013   Hypothyroidism    Endometriosis    RHINOSINUSITIS, ACUTE 02/08/2009   GERD 02/11/2007    Raynaud's syndrome 02/07/2007   Seasonal and perennial allergic rhinitis 02/07/2007   URTICARIA 02/07/2007   OSTEOARTHRITIS 02/07/2007   Past Medical History:  Diagnosis Date   Chest pain    a. s/p normal ett nuclear stress test on 10/16/13   Complication of anesthesia    COVID-19 12/2019   Diabetes mellitus without complication (HCC)    Dysrhythmia    fast heart rate   GERD (gastroesophageal reflux disease)    HLD (hyperlipidemia)    Hypothyroidism    Obesity    Osteoarthrosis, unspecified whether generalized or localized, unspecified site    Other chronic allergic conjunctivitis    PONV (postoperative nausea and vomiting)    Raynaud's syndrome    Urticaria, unspecified    Family History  Problem Relation Age of Onset   Lung cancer Father    Heart disease Father    Heart failure Mother    Dementia Mother    Heart disease Maternal Uncle    Lung cancer Paternal Uncle    COPD Paternal Uncle    Past Surgical History:  Procedure Laterality Date   CATARACT EXTRACTION Bilateral 2023   CHOLECYSTECTOMY     CYST EXCISION Left 11/10/2014   Procedure: EXCISION CYST LEFT INDEX FINGER AND CYST LEFT SMALL FINGER ;  Surgeon: Cindee Salt, MD;  Location: [facility];  Service: Orthopedics;  Laterality: Left;   ESOPHAGOGASTRODUODENOSCOPY N/A 01/05/2014   Procedure: ESOPHAGOGASTRODUODENOSCOPY (EGD);  Surgeon: Charolett Bumpers, MD;  Location: [facility];  Service: Endoscopy;  Laterality: N/A;   knot right breast     LEFT HEART CATHETERIZATION WITH CORONARY ANGIOGRAM N/A 10/22/2013   Procedure: LEFT HEART CATHETERIZATION WITH CORONARY ANGIOGRAM;  Surgeon: Iran Ouch, MD;  Location: [facility];  Service: Cardiovascular;  Laterality: N/A;   OOPHORECTOMY     LSO 84-RSO 04   thyroid nodule     TONSILLECTOMY     VAGINAL HYSTERECTOMY  01/02/2002   LAVH RSO endometriosis   VESICOVAGINAL FISTULA CLOSURE W/ TAH     Social History   Occupational History   Occupation:  Beautician  Tobacco Use   Smoking status: Never   Smokeless tobacco: Never  Vaping Use   Vaping status: Never Used  Substance and Sexual Activity   Alcohol use: Yes    Alcohol/week: 0.0 standard drinks of alcohol    Comment: RARE   Drug use: No   Sexual activity: Not Currently    Birth control/protection: Surgical, Post-menopausal    Comment: Hyst; First IC (age unknown), <5 Partners     Fara Boros) Denese Killings, M.D. [facility] 9:57 AM

## 2022-08-16 ENCOUNTER — Ambulatory Visit: Payer: PPO | Admitting: Physical Medicine and Rehabilitation

## 2022-08-16 DIAGNOSIS — M25511 Pain in right shoulder: Secondary | ICD-10-CM | POA: Diagnosis not present

## 2022-08-16 DIAGNOSIS — M79641 Pain in right hand: Secondary | ICD-10-CM | POA: Diagnosis not present

## 2022-08-16 DIAGNOSIS — R202 Paresthesia of skin: Secondary | ICD-10-CM

## 2022-08-16 DIAGNOSIS — G8929 Other chronic pain: Secondary | ICD-10-CM | POA: Diagnosis not present

## 2022-08-16 NOTE — Progress Notes (Signed)
Functional Pain Scale - descriptive words and definitions  Uncomfortable (3)  Pain is present but can complete all ADL's/sleep is slightly affected and passive distraction only gives marginal relief. Mild range order  Average Pain 5  Right handed. Right hand pain and numbness in thumb and outer part of index finger

## 2022-08-17 NOTE — Procedures (Signed)
EMG & NCV Findings: All nerve conduction studies (as indicated in the following tables) were within normal limits.    All examined muscles (as indicated in the following table) showed no evidence of electrical instability.    Impression: Essentially NORMAL electrodiagnostic study of the right upper limb.  There is no significant electrodiagnostic evidence of nerve entrapment, brachial plexopathy or cervical radiculopathy.    As you know, purely sensory or demyelinating radiculopathies and chemical radiculitis may not be detected with this particular electrodiagnostic study.   **This electrodiagnostic study cannot rule out small fiber polyneuropathy and dysesthesias from central pain syndromes such as stroke or central pain sensitization syndromes such as fibromyalgia.  Myotomal referral pain from trigger points is also not excluded.  Recommendations: 1.  Follow-up with referring physician. 2.  Continue current management of symptoms. 3.  Continue use of resting splint at night-time and as needed during the day.  ___________________________ Elease Hashimoto Board Certified, American Board of Physical Medicine and Rehabilitation    Nerve Conduction Studies Anti Sensory Summary Table   Stim Site NR Peak (ms) Norm Peak (ms) P-T Amp (V) Norm P-T Amp Site1 Site2 Delta-P (ms) Dist (cm) Vel (m/s) Norm Vel (m/s)  Right Median Acr Palm Anti Sensory (2nd Digit)  30.3C  Wrist    3.3 <3.6 39.1 >10 Wrist Palm 1.5 0.0    Palm    1.8 <2.0 15.4         Right Radial Anti Sensory (Base 1st Digit)  29.8C  Wrist    2.3 <3.1 33.9  Wrist Base 1st Digit 2.3 0.0    Right Ulnar Anti Sensory (5th Digit)  30.9C  Wrist    3.3 <3.7 23.2 >15.0 Wrist 5th Digit 3.3 14.0 42 >38   Motor Summary Table   Stim Site NR Onset (ms) Norm Onset (ms) O-P Amp (mV) Norm O-P Amp Site1 Site2 Delta-0 (ms) Dist (cm) Vel (m/s) Norm Vel (m/s)  Right Median Motor (Abd Poll Brev)  30.5C  Wrist    3.1 <4.2 8.7 >5 Elbow Wrist  3.9 19.5 50 >50  Elbow    7.0  7.1         Right Ulnar Motor (Abd Dig Min)  31.4C  Wrist    3.0 <4.2 11.8 >3 B Elbow Wrist 3.3 19.0 58 >53  B Elbow    6.3  10.9  A Elbow B Elbow 1.1 11.0 100 >53  A Elbow    7.4  9.5          EMG   Side Muscle Nerve Root Ins Act Fibs Psw Amp Dur Poly Recrt Int Dennie Bible Comment  Right Abd Poll Brev Median C8-T1 Nml Nml Nml Nml Nml 0 Nml Nml   Right 1stDorInt Ulnar C8-T1 Nml Nml Nml Nml Nml 0 Nml Nml   Right PronatorTeres Median C6-7 Nml Nml Nml Nml Nml 0 Nml Nml   Right Biceps Musculocut C5-6 Nml Nml Nml Nml Nml 0 Nml Nml   Right Deltoid Axillary C5-6 Nml Nml Nml Nml Nml 0 Nml Nml     Nerve Conduction Studies Anti Sensory Left/Right Comparison   Stim Site L Lat (ms) R Lat (ms) L-R Lat (ms) L Amp (V) R Amp (V) L-R Amp (%) Site1 Site2 L Vel (m/s) R Vel (m/s) L-R Vel (m/s)  Median Acr Palm Anti Sensory (2nd Digit)  30.3C  Wrist  3.3   39.1  Wrist Palm     Palm  1.8   15.4  Radial Anti Sensory (Base 1st Digit)  29.8C  Wrist  2.3   33.9  Wrist Base 1st Digit     Ulnar Anti Sensory (5th Digit)  30.9C  Wrist  3.3   23.2  Wrist 5th Digit  42    Motor Left/Right Comparison   Stim Site L Lat (ms) R Lat (ms) L-R Lat (ms) L Amp (mV) R Amp (mV) L-R Amp (%) Site1 Site2 L Vel (m/s) R Vel (m/s) L-R Vel (m/s)  Median Motor (Abd Poll Brev)  30.5C  Wrist  3.1   8.7  Elbow Wrist  50   Elbow  7.0   7.1        Ulnar Motor (Abd Dig Min)  31.4C  Wrist  3.0   11.8  B Elbow Wrist  58   B Elbow  6.3   10.9  A Elbow B Elbow  100   A Elbow  7.4   9.5           Waveforms:

## 2022-08-22 ENCOUNTER — Encounter: Payer: Self-pay | Admitting: Physical Medicine and Rehabilitation

## 2022-08-22 ENCOUNTER — Telehealth: Payer: Self-pay | Admitting: Internal Medicine

## 2022-08-22 NOTE — Progress Notes (Signed)
Jill Dean - 77 y.o. female MRN [id number]  Date of birth: Dec 09, 1945  Office Visit Note: Visit Date: 08/16/2022 PCP: Lorenda Ishihara, MD Referred by: Samuella Cota, MD  Subjective: Chief Complaint  Patient presents with   Right Hand - Pain, Numbness   HPI: Jill Dean is a 77 y.o. female who comes in today at the request of Dr. Bonner Puna for evaluation and management of chronic, worsening and severe pain, numbness and tingling in the Right upper extremities.  Patient is Right hand dominant.  She is a patient I have seen in the past through Dr. Norlene Campbell before he retired.  We saw her for her low back pain and completed facet joint blocks.  She comes in today with history of long-term problems with her hands including significant osteoarthritis and some deformity of the PIP joint particularly index finger.  She has a history of seeing Maryann earlier in July with a injury to her hand with slamming the car door on her hand.  She has been wearing a splint.  She complains of pain at the base of the thumb and the finger but also paresthesias into the hand and mostly the radial digits.  She endorses neck pain and shoulder pain but no frank radicular pain shooting down the arms.  No real paresthesias on the left.  Her case is complicated by history of diabetes with hemoglobin A1c in the low 7 range as well as hypothyroidism.  She has been using some ibuprofen and is on Elavil.  She has a history of multiple drug intolerances.  No active diagnosis of polyneuropathy or fibromyalgia.  No prior electrodiagnostic study.   I spent more than 30 minutes speaking face-to-face with the patient with 50% of the time in counseling and discussing coordination of care.      Review of Systems  Musculoskeletal:  Positive for back pain and joint pain.  Neurological:  Positive for tingling and weakness.  All other systems reviewed and are negative.  Otherwise per HPI.  Assessment &  Plan: Visit Diagnoses:    ICD-10-CM   1. Paresthesia of skin  R20.2 NCV with EMG (electromyography)    2. Pain in right hand  M79.641     3. Chronic right shoulder pain  M25.511    G89.29        Plan: Impression: Differential diagnosis includes known osteoarthritis of the hands and fingers along with possible carpal tunnel syndrome versus radiculopathy.  Electrodiagnostic study performed today.  Essentially NORMAL electrodiagnostic study of the right upper limb.  There is no significant electrodiagnostic evidence of nerve entrapment, brachial plexopathy or cervical radiculopathy.    As you know, purely sensory or demyelinating radiculopathies and chemical radiculitis may not be detected with this particular electrodiagnostic study.   **This electrodiagnostic study cannot rule out small fiber polyneuropathy and dysesthesias from central pain syndromes such as stroke or central pain sensitization syndromes such as fibromyalgia.  Myotomal referral pain from trigger points is also not excluded.  Recommendations: 1.  Follow-up with referring physician. 2.  Continue current management of symptoms. 3.  Continue use of resting splint at night-time and as needed during the day.  Meds & Orders: No orders of the defined types were placed in this encounter.   Orders Placed This Encounter  Procedures   NCV with EMG (electromyography)    Follow-up: Return for Anshul Argarwala, MD.   Procedures: No procedures performed  EMG & NCV Findings: All nerve conduction studies (as indicated  in the following tables) were within normal limits.    All examined muscles (as indicated in the following table) showed no evidence of electrical instability.    Impression: Essentially NORMAL electrodiagnostic study of the right upper limb.  There is no significant electrodiagnostic evidence of nerve entrapment, brachial plexopathy or cervical radiculopathy.    As you know, purely sensory or demyelinating  radiculopathies and chemical radiculitis may not be detected with this particular electrodiagnostic study.   **This electrodiagnostic study cannot rule out small fiber polyneuropathy and dysesthesias from central pain syndromes such as stroke or central pain sensitization syndromes such as fibromyalgia.  Myotomal referral pain from trigger points is also not excluded.  Recommendations: 1.  Follow-up with referring physician. 2.  Continue current management of symptoms. 3.  Continue use of resting splint at night-time and as needed during the day.  ___________________________ Elease Hashimoto Board Certified, American Board of Physical Medicine and Rehabilitation    Nerve Conduction Studies Anti Sensory Summary Table   Stim Site NR Peak (ms) Norm Peak (ms) P-T Amp (V) Norm P-T Amp Site1 Site2 Delta-P (ms) Dist (cm) Vel (m/s) Norm Vel (m/s)  Right Median Acr Palm Anti Sensory (2nd Digit)  30.3C  Wrist    3.3 <3.6 39.1 >10 Wrist Palm 1.5 0.0    Palm    1.8 <2.0 15.4         Right Radial Anti Sensory (Base 1st Digit)  29.8C  Wrist    2.3 <3.1 33.9  Wrist Base 1st Digit 2.3 0.0    Right Ulnar Anti Sensory (5th Digit)  30.9C  Wrist    3.3 <3.7 23.2 >15.0 Wrist 5th Digit 3.3 14.0 42 >38   Motor Summary Table   Stim Site NR Onset (ms) Norm Onset (ms) O-P Amp (mV) Norm O-P Amp Site1 Site2 Delta-0 (ms) Dist (cm) Vel (m/s) Norm Vel (m/s)  Right Median Motor (Abd Poll Brev)  30.5C  Wrist    3.1 <4.2 8.7 >5 Elbow Wrist 3.9 19.5 50 >50  Elbow    7.0  7.1         Right Ulnar Motor (Abd Dig Min)  31.4C  Wrist    3.0 <4.2 11.8 >3 B Elbow Wrist 3.3 19.0 58 >53  B Elbow    6.3  10.9  A Elbow B Elbow 1.1 11.0 100 >53  A Elbow    7.4  9.5          EMG   Side Muscle Nerve Root Ins Act Fibs Psw Amp Dur Poly Recrt Int Dennie Bible Comment  Right Abd Poll Brev Median C8-T1 Nml Nml Nml Nml Nml 0 Nml Nml   Right 1stDorInt Ulnar C8-T1 Nml Nml Nml Nml Nml 0 Nml Nml   Right PronatorTeres Median C6-7 Nml  Nml Nml Nml Nml 0 Nml Nml   Right Biceps Musculocut C5-6 Nml Nml Nml Nml Nml 0 Nml Nml   Right Deltoid Axillary C5-6 Nml Nml Nml Nml Nml 0 Nml Nml     Nerve Conduction Studies Anti Sensory Left/Right Comparison   Stim Site L Lat (ms) R Lat (ms) L-R Lat (ms) L Amp (V) R Amp (V) L-R Amp (%) Site1 Site2 L Vel (m/s) R Vel (m/s) L-R Vel (m/s)  Median Acr Palm Anti Sensory (2nd Digit)  30.3C  Wrist  3.3   39.1  Wrist Palm     Palm  1.8   15.4        Radial Anti Sensory (Base 1st Digit)  29.8C  Wrist  2.3   33.9  Wrist Base 1st Digit     Ulnar Anti Sensory (5th Digit)  30.9C  Wrist  3.3   23.2  Wrist 5th Digit  42    Motor Left/Right Comparison   Stim Site L Lat (ms) R Lat (ms) L-R Lat (ms) L Amp (mV) R Amp (mV) L-R Amp (%) Site1 Site2 L Vel (m/s) R Vel (m/s) L-R Vel (m/s)  Median Motor (Abd Poll Brev)  30.5C  Wrist  3.1   8.7  Elbow Wrist  50   Elbow  7.0   7.1        Ulnar Motor (Abd Dig Min)  31.4C  Wrist  3.0   11.8  B Elbow Wrist  58   B Elbow  6.3   10.9  A Elbow B Elbow  100   A Elbow  7.4   9.5           Waveforms:             Clinical History: MRI LUMBAR SPINE WITHOUT CONTRAST   TECHNIQUE: Multiplanar, multisequence MR imaging of the lumbar spine was performed. No intravenous contrast was administered.   COMPARISON:  Radiographs dated February 21, 2021   FINDINGS: Segmentation:  Standard.   Alignment:  4 mm anterolisthesis of L4.   Vertebrae: No fracture, evidence of discitis, or bone lesion. Likely small hemangioma in T12 vertebral body.   Conus medullaris and cauda equina: Conus extends to the L1 level. Conus and cauda equina appear normal.   Paraspinal and other soft tissues: Nonspecific subcutaneous soft tissue edema.   Disc spaces:   T12-L1: No significant disc bulge. No neural foraminal stenosis. No central canal stenosis. Mild facet joint arthropathy   L1-L2: No significant disc bulge. Mild ligamentum flavum hypertrophy. No neural  foraminal stenosis. No central canal stenosis. Mild facet joint arthropathy [name].   L2-L3: Mild circumferential disc protrusion and ligamentum flavum hypertrophy with narrowing of spinal canal. Mild facet joint arthropathy. No significant neural foraminal narrowing.   L3-L4: Circumferential disc protrusion and ligamentum flavum hypertrophy with mild-to-moderate narrowing of spinal canal. Mild narrowing of bilateral lateral recesses. Moderate facet joint arthropathy. No significant neural foraminal narrowing.   L4-L5: Moderate circumferential disc protrusion and ligamentum flavum hypertrophy with narrowing of spinal canal. Narrowing of lateral recesses bilaterally with encroachment of the L5 nerve roots bilaterally. Moderate facet joint arthropathy, left worse than the right. No significant neural foraminal narrowing.   L5-S1: Moderate disc height loss with circumferential disc protrusion and subarticular narrowing bilaterally. Encroachment of the bilateral S1 nerve roots. Moderate facet joint arthropathy. Mild bilateral neural foraminal narrowing at L5.   IMPRESSION: 1.  No evidence of acute fracture or subluxation.   2. 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     Electronically Signed   By: Larose Hires D.O.   On: 03/06/2021 14:42   She reports that she has never smoked. She has never used smokeless tobacco.  Recent Labs    12/05/21 1219 06/19/22 1155  HGBA1C 7.1* 7.3*    Objective:  VS:  HT:    WT:   BMI:     BP:   HR: bpm  TEMP: ( )  RESP:  Physical Exam Vitals and nursing note reviewed.  Constitutional:      General: She is not in acute distress.    Appearance: Normal appearance. She is well-developed. She is not ill-appearing.  HENT:  Head: Normocephalic and atraumatic.  Eyes:     Conjunctiva/sclera: Conjunctivae normal.     Pupils: Pupils are equal, round, and reactive to light.  Cardiovascular:     Rate  and Rhythm: Normal rate.     Pulses: Normal pulses.  Pulmonary:     Effort: Pulmonary effort is normal.  Musculoskeletal:        General: No swelling, tenderness or deformity.     Right lower leg: No edema.     Left lower leg: No edema.     Comments: Inspection shows osteoarthritic changes of the [facility] joint and distal PIP joints of the index finger and other joints.  Inspection reveals no atrophy of the bilateral APB or FDI or hand intrinsics. There is no swelling, color changes, allodynia or dystrophic changes. There is 5 out of 5 strength in the bilateral wrist extension, finger abduction and long finger flexion. There is intact sensation to light touch in all dermatomal and peripheral nerve distributions. There is a negative Tinel's test at the bilateral wrist and elbow. There is a negative Phalen's test bilaterally. There is a negative Hoffmann's test bilaterally.  Skin:    General: Skin is warm and dry.     Findings: No erythema or rash.  Neurological:     General: No focal deficit present.     Mental Status: She is alert and oriented to person, place, and time.     Sensory: No sensory deficit.     Motor: No weakness or abnormal muscle tone.     Coordination: Coordination normal.     Gait: Gait normal.  Psychiatric:        Mood and Affect: Mood normal.        Behavior: Behavior normal.     Ortho Exam  Imaging: No results found.  Past Medical/Family/Surgical/Social History: Medications & Allergies reviewed per EMR, new medications updated. Patient Active Problem List   Diagnosis Date Noted   Impingement syndrome of right shoulder 11/02/2021   Hoarseness 07/31/2021   Rash and nonspecific skin eruption 11/07/2020   Type 2 diabetes mellitus with hyperglycemia, without long-term current use of insulin (HCC) 10/15/2020   Hypertriglyceridemia 04/18/2019   Closed nondisplaced fracture of proximal phalanx of lesser toe of right foot 06/05/2017   Non-suppurative otitis media  05/19/2015   PSVT (paroxysmal supraventricular tachycardia) 10/29/2013   Diabetes mellitus with coincident hypertension (HCC) 10/29/2013   Type 2 diabetes mellitus without complication (HCC) 10/29/2013   History of tachycardia 10/22/2013   RBBB (right bundle branch block) 10/22/2013   Drug effect 10/22/2013   Chest pain 10/16/2013   Palpitations 10/16/2013   DM (diabetes mellitus) (HCC)    Obesity    HLD (hyperlipidemia)    SVT (supraventricular tachycardia) 10/15/2013   Hypothyroidism    Endometriosis    RHINOSINUSITIS, ACUTE 02/08/2009   GERD 02/11/2007   Raynaud's syndrome 02/07/2007   Seasonal and perennial allergic rhinitis 02/07/2007   URTICARIA 02/07/2007   OSTEOARTHRITIS 02/07/2007   Past Medical History:  Diagnosis Date   Chest pain    a. s/p normal ett nuclear stress test on 10/16/13   Complication of anesthesia    COVID-19 12/2019   Diabetes mellitus without complication (HCC)    Dysrhythmia    fast heart rate   GERD (gastroesophageal reflux disease)    HLD (hyperlipidemia)    Hypothyroidism    Obesity    Osteoarthrosis, unspecified whether generalized or localized, unspecified site    Other chronic allergic conjunctivitis    PONV (  postoperative nausea and vomiting)    Raynaud's syndrome    Urticaria, unspecified    Family History  Problem Relation Age of Onset   Lung cancer Father    Heart disease Father    Heart failure Mother    Dementia Mother    Heart disease Maternal Uncle    Lung cancer Paternal Uncle    COPD Paternal Uncle    Past Surgical History:  Procedure Laterality Date   CATARACT EXTRACTION Bilateral 2023   CHOLECYSTECTOMY     CYST EXCISION Left 11/10/2014   Procedure: EXCISION CYST LEFT INDEX FINGER AND CYST LEFT SMALL FINGER ;  Surgeon: Cindee Salt, MD;  Location: [facility];  Service: Orthopedics;  Laterality: Left;   ESOPHAGOGASTRODUODENOSCOPY N/A 01/05/2014   Procedure: ESOPHAGOGASTRODUODENOSCOPY (EGD);  Surgeon:  Charolett Bumpers, MD;  Location: [facility];  Service: Endoscopy;  Laterality: N/A;   knot right breast     LEFT HEART CATHETERIZATION WITH CORONARY ANGIOGRAM N/A 10/22/2013   Procedure: LEFT HEART CATHETERIZATION WITH CORONARY ANGIOGRAM;  Surgeon: Iran Ouch, MD;  Location: [facility];  Service: Cardiovascular;  Laterality: N/A;   OOPHORECTOMY     LSO 84-RSO 04   thyroid nodule     TONSILLECTOMY     VAGINAL HYSTERECTOMY  01/02/2002   LAVH RSO endometriosis   VESICOVAGINAL FISTULA CLOSURE W/ TAH     Social History   Occupational History   Occupation: Beautician  Tobacco Use   Smoking status: Never   Smokeless tobacco: Never  Vaping Use   Vaping status: Never Used  Substance and Sexual Activity   Alcohol use: Yes    Alcohol/week: 0.0 standard drinks of alcohol    Comment: RARE   Drug use: No   Sexual activity: Not Currently    Birth control/protection: Surgical, Post-menopausal    Comment: Hyst; First IC (age unknown), <5 [employer]

## 2022-08-22 NOTE — Telephone Encounter (Signed)
Patient is calling because she is sick. She currently has a cough, low temperature and mucus. She would like a round of antibiotics (doxycycline). Please advise.

## 2022-08-25 ENCOUNTER — Ambulatory Visit: Payer: PPO | Admitting: Orthopedic Surgery

## 2022-08-25 NOTE — Telephone Encounter (Signed)
Spoke with patient regarding prior message . Patient stated she is feeling much better and patient is just getting over COVID . Patient does have a office a office visit with Dr.Young on 09/07/22. Patient's voice was understanding.Nothing else further needed.

## 2022-08-27 ENCOUNTER — Other Ambulatory Visit: Payer: Self-pay | Admitting: Cardiology

## 2022-08-28 ENCOUNTER — Ambulatory Visit: Payer: PPO | Admitting: Orthopedic Surgery

## 2022-08-28 DIAGNOSIS — M1811 Unilateral primary osteoarthritis of first carpometacarpal joint, right hand: Secondary | ICD-10-CM | POA: Diagnosis not present

## 2022-08-28 DIAGNOSIS — M152 Bouchard's nodes (with arthropathy): Secondary | ICD-10-CM

## 2022-08-28 NOTE — Progress Notes (Signed)
Jill Dean - 77 y.o. female MRN [id number]  Date of birth: 26-Oct-1945  Office Visit Note: Visit Date: 08/28/2022 PCP: Jill Ishihara, MD Referred by: Jill Dean,*  Subjective: No chief complaint on file.  HPI: Jill Dean is a pleasant 77 y.o. female who presents today for follow-up regarding right hand numbness and tingling with associated pain at the right thumb basilar joint and index finger PIP.  She does have known arthritic change of both the right thumb basal joint and index PIP with notable deformity.  She underwent recent nerve study to rule out any potential nerve compression, nerve study looks essentially normal.  Pertinent ROS were reviewed with the patient and found to be negative unless otherwise specified above in HPI.    Assessment & Plan: Visit Diagnoses: No diagnosis found.  Plan: Extensive discussion was once again had with the patient regarding her options from the arthritic changes in her hand.  We discussed the thumb basilar joint, treatment options ranging from conservative to surgical.  She is happy with the Comfort Cool brace she is wearing at this juncture which has helped mitigating her symptoms.  As for the index finger PIP, I showed her Coban wrapping today which she liked and will do as needed.  I discussed with her options of arthroplasty versus arthrodesis of the index finger PIP.  Given that this is a border digit, she was interested in potential arthrodesis in the future for better stability.  However, she would like to continue with conservative measures for the time being which I am in agreement with, and will return to me in the future for further discussion should her symptoms worsen.  We reviewed the results of her nerve study which do not show any significant nerve entrapment.  Patient expressed understanding.  Follow-up: Return if symptoms worsen or fail to improve.   Meds & Orders: No orders of the defined types were placed in  this encounter.  No orders of the defined types were placed in this encounter.    Procedures: No procedures performed      Clinical History: MRI LUMBAR SPINE WITHOUT CONTRAST   TECHNIQUE: Multiplanar, multisequence MR imaging of the lumbar spine was performed. No intravenous contrast was administered.   COMPARISON:  Radiographs dated February 21, 2021   FINDINGS: Segmentation:  Standard.   Alignment:  4 mm anterolisthesis of L4.   Vertebrae: No fracture, evidence of discitis, or bone lesion. Likely small hemangioma in T12 vertebral body.   Conus medullaris and cauda equina: Conus extends to the L1 level. Conus and cauda equina appear normal.   Paraspinal and other soft tissues: Nonspecific subcutaneous soft tissue edema.   Disc spaces:   T12-L1: No significant disc bulge. No neural foraminal stenosis. No central canal stenosis. Mild facet joint arthropathy   L1-L2: No significant disc bulge. Mild ligamentum flavum hypertrophy. No neural foraminal stenosis. No central canal stenosis. Mild facet joint arthropathy Jill Dean.   L2-L3: Mild circumferential disc protrusion and ligamentum flavum hypertrophy with narrowing of spinal canal. Mild facet joint arthropathy. No significant neural foraminal narrowing.   L3-L4: Circumferential disc protrusion and ligamentum flavum hypertrophy with mild-to-moderate narrowing of spinal canal. Mild narrowing of bilateral lateral recesses. Moderate facet joint arthropathy. No significant neural foraminal narrowing.   L4-L5: Moderate circumferential disc protrusion and ligamentum flavum hypertrophy with narrowing of spinal canal. Narrowing of lateral recesses bilaterally with encroachment of the L5 nerve roots bilaterally. Moderate facet joint arthropathy, left worse than the right. No significant  neural foraminal narrowing.   L5-S1: Moderate disc height loss with circumferential disc protrusion and subarticular narrowing bilaterally.  Encroachment of the bilateral S1 nerve roots. Moderate facet joint arthropathy. Mild bilateral neural foraminal narrowing at L5.   IMPRESSION: 1.  No evidence of acute fracture or subluxation.   2. 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     Electronically Signed   By: Jill Dean D.O.   On: 03/06/2021 14:42  She reports that she has never smoked. She has never used smokeless tobacco.  Recent Labs    12/05/21 1219 06/19/22 1155  HGBA1C 7.1* 7.3*    Objective:   Vital Signs: There were no vitals taken for this visit.  Physical Exam  Gen: Well-appearing, in no acute distress; non-toxic CV: Regular Rate. Well-perfused. Warm.  Resp: Breathing unlabored on room air; no wheezing. Psych: Fluid speech in conversation; appropriate affect; normal thought process  Ortho Exam Right hand: - Positive thumb CMC grind for pain and crepitus, slight MP hyperextension approximately 10 degrees - Sensation intact in all distributions to light touch - Tenderness and swelling associated at the index PIP, range of motion of the PIP 10-25  Imaging: No results found.  Past Medical/Family/Surgical/Social History: Medications & Allergies reviewed per EMR, new medications updated. Patient Active Problem List   Diagnosis Date Noted   Impingement syndrome of right shoulder 11/02/2021   Hoarseness 07/31/2021   Rash and nonspecific skin eruption 11/07/2020   Type 2 diabetes mellitus with hyperglycemia, without long-term current use of insulin (HCC) 10/15/2020   Hypertriglyceridemia 04/18/2019   Closed nondisplaced fracture of proximal phalanx of lesser toe of right foot 06/05/2017   Non-suppurative otitis media 05/19/2015   PSVT (paroxysmal supraventricular tachycardia) 10/29/2013   Diabetes mellitus with coincident hypertension (HCC) 10/29/2013   Type 2 diabetes mellitus without complication (HCC) 10/29/2013   History of tachycardia 10/22/2013   RBBB  (right bundle branch block) 10/22/2013   Drug effect 10/22/2013   Chest pain 10/16/2013   Palpitations 10/16/2013   DM (diabetes mellitus) (HCC)    Obesity    HLD (hyperlipidemia)    SVT (supraventricular tachycardia) 10/15/2013   Hypothyroidism    Endometriosis    RHINOSINUSITIS, ACUTE 02/08/2009   GERD 02/11/2007   Raynaud's syndrome 02/07/2007   Seasonal and perennial allergic rhinitis 02/07/2007   URTICARIA 02/07/2007   OSTEOARTHRITIS 02/07/2007   Past Medical History:  Diagnosis Date   Chest pain    a. s/p normal ett nuclear stress test on 10/16/13   Complication of anesthesia    COVID-19 12/2019   Diabetes mellitus without complication (HCC)    Dysrhythmia    fast heart rate   GERD (gastroesophageal reflux disease)    HLD (hyperlipidemia)    Hypothyroidism    Obesity    Osteoarthrosis, unspecified whether generalized or localized, unspecified site    Other chronic allergic conjunctivitis    PONV (postoperative nausea and vomiting)    Raynaud's syndrome    Urticaria, unspecified    Family History  Problem Relation Age of Onset   Lung cancer Father    Heart disease Father    Heart failure Mother    Dementia Mother    Heart disease Maternal Uncle    Lung cancer Paternal Uncle    COPD Paternal Uncle    Past Surgical History:  Procedure Laterality Date   CATARACT EXTRACTION Bilateral 2023   CHOLECYSTECTOMY     CYST EXCISION Left 11/10/2014   Procedure: EXCISION CYST  LEFT INDEX FINGER AND CYST LEFT SMALL FINGER ;  Surgeon: Cindee Salt, MD;  Location: [facility];  Service: Orthopedics;  Laterality: Left;   ESOPHAGOGASTRODUODENOSCOPY N/A 01/05/2014   Procedure: ESOPHAGOGASTRODUODENOSCOPY (EGD);  Surgeon: Charolett Bumpers, MD;  Location: [facility];  Service: Endoscopy;  Laterality: N/A;   knot right breast     LEFT HEART CATHETERIZATION WITH CORONARY ANGIOGRAM N/A 10/22/2013   Procedure: LEFT HEART CATHETERIZATION WITH CORONARY ANGIOGRAM;   Surgeon: Iran Ouch, MD;  Location: [facility];  Service: Cardiovascular;  Laterality: N/A;   OOPHORECTOMY     LSO 84-RSO 04   thyroid nodule     TONSILLECTOMY     VAGINAL HYSTERECTOMY  01/02/2002   LAVH RSO endometriosis   VESICOVAGINAL FISTULA CLOSURE W/ TAH     Social History   Occupational History   Occupation: Beautician  Tobacco Use   Smoking status: Never   Smokeless tobacco: Never  Vaping Use   Vaping status: Never Used  Substance and Sexual Activity   Alcohol use: Yes    Alcohol/week: 0.0 standard drinks of alcohol    Comment: RARE   Drug use: No   Sexual activity: Not Currently    Birth control/protection: Surgical, Post-menopausal    Comment: Hyst; First IC (age unknown), <5 [employer]    Breelle Hollywood Fara Boros) Denese Killings, M.D. [facility] 9:12 AM

## 2022-09-06 NOTE — Progress Notes (Signed)
    Patient ID: Jill Dean, female    DOB: 1945/01/30, 77 y.o.   MRN: [id number]  HPI female never smoker followed for allergic rhinitis/conjunctivitis, complicated by history Raynaud's syndrome, GERD, urticaria, DM, PSVT Allergen Panel 06/20/21- IgE 47, specific IgEs all low --------------------------------------------------------------------------------------------------  06/20/21- 77 year old female never smoker followed for Allergic Rhinitis/conjunctivitis, complicated by history Raynaud's syndrome, GERD, Urticaria, DM2, Hypothyroid, Dyslipidemia, HTN, PSVT, Covid infection Dec 2021, Hyperlipidemia, Obesity,  -Zyrtec, Flonase, Astelin Covid vax 1 Phizer -----Problems swallowing. Feels like something is in her throat Describes dysphagia and hoarseness as very gradually worse over past year. Asks about allergy testing for this. Doesn't notice reflux, heartburn.Doesn't think it is wheezing or coughing.  09/07/22- 77 year old female never smoker followed for Allergic Rhinitis/conjunctivitis, complicated by history Raynaud's syndrome, GERD, Urticaria, DM2, Hypothyroid, Dyslipidemia, HTN, PSVT, Covid infection Dec 2021, Hyperlipidemia, Obesity,  -Zyrtec, Flonase, Astelin Allergen Panel 06/20/21- IgE 47, specific IgEs all low Has enough of her nasal sprays and antihistamine. This is mostly non-allergic/ vasomotor, but there is a seasonal component. Says hse had covid 3 weeks ago, with transient bad cough. Feels back to baseline. Not a "vaxxer". CXR and L ribs 02/17/22 IMPRESSION: Negative.  Review of Systems-See HPI + = positive Constitutional:   No-   weight loss, night sweats, fevers, chills, fatigue, lassitude. HEENT:    headaches, +difficulty swallowing, tooth/dental problems, +sore throat,       sneezing, itching, ear ache, +nasal congestion, +post nasal drip,  CV:  No-   chest pain, orthopnea, PND, swelling in lower extremities, anasarca,  dizziness, palpitations Resp: No-   shortness of  breath with exertion or at rest.            -productive cough,  No non-productive cough,  No-  coughing up of blood.              -change in color of mucus.  No- wheezing.    Objective:   Physical Exam General- Alert, Oriented, Affect-appropriate, Distress- none acute  +Obese Skin- +faint erythema butterfly distribution Lymphadenopathy- none Head- atraumatic            Eyes- Gross vision intact, PERRLA, conjunctivae clear secretions, +periorbital edema            Ears- Hearing,             Nose-  +turbinate edema , No-Septal dev,  polyps, erosion, perforation,             Throat- Mallampati III-IV , mucosa clear-not red , drainage- none, tonsils- atrophic.,  Neck- flexible , trachea midline, no stridor , thyroid nl, carotid no bruit Chest -            Lung- clear to P&A but diminished, wheeze- none, cough- none , dullness-none, rub- none           Cardiac- RRR           Chest wall-  Abd-  Br/ Gen/ Rectal- Not done, not indicated Extrem- cyanosis- none, clubbing, none, atrophy- none, strength- nl, + superficial varices Neuro- grossly intact to observation

## 2022-09-07 ENCOUNTER — Encounter: Payer: Self-pay | Admitting: Internal Medicine

## 2022-09-07 ENCOUNTER — Ambulatory Visit: Payer: PPO | Admitting: Internal Medicine

## 2022-09-07 VITALS — BP 126/66 | HR 103 | Ht 63.5 in | Wt 237.2 lb

## 2022-09-07 DIAGNOSIS — I471 Supraventricular tachycardia, unspecified: Secondary | ICD-10-CM | POA: Diagnosis not present

## 2022-09-07 DIAGNOSIS — J302 Other seasonal allergic rhinitis: Secondary | ICD-10-CM

## 2022-09-07 DIAGNOSIS — J3089 Other allergic rhinitis: Secondary | ICD-10-CM

## 2022-09-07 NOTE — Patient Instructions (Addendum)
Glad you got over the Covid ok. Please let us know if we can help.

## 2022-09-10 ENCOUNTER — Encounter: Payer: Self-pay | Admitting: Internal Medicine

## 2022-09-10 NOTE — Assessment & Plan Note (Signed)
Allergic and non-allergic. Plan- continue use of current meds as needed.

## 2022-09-10 NOTE — Assessment & Plan Note (Signed)
Denies recent problems. Follows with cardiology

## 2022-09-25 DIAGNOSIS — E785 Hyperlipidemia, unspecified: Secondary | ICD-10-CM | POA: Diagnosis not present

## 2022-09-25 DIAGNOSIS — Z6841 Body Mass Index (BMI) 40.0 and over, adult: Secondary | ICD-10-CM | POA: Diagnosis not present

## 2022-09-25 DIAGNOSIS — E1165 Type 2 diabetes mellitus with hyperglycemia: Secondary | ICD-10-CM | POA: Diagnosis not present

## 2022-09-25 DIAGNOSIS — I4719 Other supraventricular tachycardia: Secondary | ICD-10-CM | POA: Diagnosis not present

## 2022-09-25 DIAGNOSIS — E1151 Type 2 diabetes mellitus with diabetic peripheral angiopathy without gangrene: Secondary | ICD-10-CM | POA: Diagnosis not present

## 2022-10-16 ENCOUNTER — Other Ambulatory Visit: Payer: Self-pay | Admitting: Internal Medicine

## 2022-10-17 DIAGNOSIS — Z1231 Encounter for screening mammogram for malignant neoplasm of breast: Secondary | ICD-10-CM | POA: Diagnosis not present

## 2022-11-09 ENCOUNTER — Encounter: Payer: Self-pay | Admitting: Nurse Practitioner

## 2022-11-09 ENCOUNTER — Ambulatory Visit: Payer: PPO | Admitting: Nurse Practitioner

## 2022-11-09 VITALS — BP 120/64 | HR 85 | Ht 62.6 in | Wt 234.0 lb

## 2022-11-09 DIAGNOSIS — R3989 Other symptoms and signs involving the genitourinary system: Secondary | ICD-10-CM

## 2022-11-09 DIAGNOSIS — N898 Other specified noninflammatory disorders of vagina: Secondary | ICD-10-CM | POA: Diagnosis not present

## 2022-11-09 DIAGNOSIS — H26493 Other secondary cataract, bilateral: Secondary | ICD-10-CM | POA: Diagnosis not present

## 2022-11-09 DIAGNOSIS — E785 Hyperlipidemia, unspecified: Secondary | ICD-10-CM | POA: Diagnosis not present

## 2022-11-09 DIAGNOSIS — H5211 Myopia, right eye: Secondary | ICD-10-CM | POA: Diagnosis not present

## 2022-11-09 DIAGNOSIS — B3731 Acute candidiasis of vulva and vagina: Secondary | ICD-10-CM | POA: Diagnosis not present

## 2022-11-09 DIAGNOSIS — Z961 Presence of intraocular lens: Secondary | ICD-10-CM | POA: Diagnosis not present

## 2022-11-09 LAB — URINALYSIS, COMPLETE W/RFL CULTURE
Bacteria, UA: NONE SEEN /[HPF]
Bilirubin Urine: NEGATIVE
Glucose, UA: NEGATIVE
Hgb urine dipstick: NEGATIVE
Hyaline Cast: NONE SEEN /[LPF]
Ketones, ur: NEGATIVE
Leukocyte Esterase: NEGATIVE
Nitrites, Initial: NEGATIVE
Protein, ur: NEGATIVE
RBC / HPF: NONE SEEN /[HPF] (ref 0–2)
Specific Gravity, Urine: 1.01 (ref 1.001–1.035)
WBC, UA: NONE SEEN /[HPF] (ref 0–5)
pH: 6.5 (ref 5.0–8.0)

## 2022-11-09 LAB — WET PREP FOR TRICH, YEAST, CLUE

## 2022-11-09 LAB — NO CULTURE INDICATED

## 2022-11-09 MED ORDER — FLUCONAZOLE 150 MG PO TABS: 150.0000 mg | ORAL_TABLET | ORAL | 0 refills | Status: DC

## 2022-11-09 NOTE — Progress Notes (Signed)
   Acute Office Visit  Subjective:    Patient ID: Jill Dean, female    DOB: 1945/04/13, 77 y.o.   MRN: [id number]   HPI 77 y.o. presents today for bladder pressure since yesterday. Some urgency. Denies dysuria, frequency, hematuria, back pain. Also noticed yellow discharge on underwear, some vulvar irritation. Denies itching or odor.   No LMP recorded. Patient has had a hysterectomy.    Review of Systems  Constitutional:  Negative for chills and fatigue.  Genitourinary:  Positive for urgency and vaginal discharge. Negative for difficulty urinating, dysuria, flank pain, frequency, hematuria and vaginal pain.       Pressure       Objective:    Physical Exam Constitutional:      Appearance: Normal appearance.  Genitourinary:    General: Normal vulva.     Vagina: Vaginal discharge present. No erythema.     BP 120/64   Pulse 85   Ht 5' 2.6" (1.59 m)   Wt 234 lb (106.1 kg)   SpO2 98%   BMI 41.99 kg/m  Wt Readings from Last 3 Encounters:  11/09/22 234 lb (106.1 kg)  09/07/22 237 lb 3.2 oz (107.6 kg)  06/19/22 237 lb (107.5 kg)        Patient informed chaperone available to be present for breast and/or pelvic exam. Patient has requested no chaperone to be present. Patient has been advised what will be completed during breast and pelvic exam.   Wet prep + yeast UA negative  Assessment & Plan:   Problem List Items Addressed This Visit   None Visit Diagnoses     Vagina, candidiasis    -  Primary   Relevant Medications   fluconazole (DIFLUCAN) 150 MG tablet   Vaginal discharge       Relevant Orders   WET PREP FOR TRICH, YEAST, CLUE   Sensation of pressure in bladder area       Relevant Orders   Urinalysis,Complete w/RFL Culture      Plan: Wet prep positive for yeast - Diflucan 150 mg today and repeat in 3 days. UA negative.   Return if symptoms worsen or fail to improve.    Olivia Mackie DNP, 12:02 PM 11/09/2022

## 2022-11-18 ENCOUNTER — Other Ambulatory Visit: Payer: Self-pay | Admitting: Internal Medicine

## 2022-12-10 NOTE — Progress Notes (Unsigned)
  Cardiology Office Note:  .   Date:  12/10/2022  ID:  Jill Dean, DOB [birth year], MRN [id number] PCP: Lorenda Ishihara, MD  [facility] Providers Cardiologist:  Donato Schultz, MD {  History of Present Illness: .   Jill Dean is a 77 y.o. female referred to lipid clinic by Dr. Anne Fu with a past medical history of HTN, HLD, SVT, DM, GERD previously on atorvastatin but had muscle cramps and increasing blood sugars.  Latest LDL was 144, TG 287.  Was seen by the lipid clinic 01/26/22 and reported that she was sensitive to medications.  Tried several all with muscle symptoms.  No interest in trying any other statins.  Reports she used to be very active until the day she got her COVID-vaccine and then started having issues with her back.  Since then she received 4 steroid injections and is hoping to be able to start walking again.  Sees a homeopathic doctor and takes several supplements.  Prescribed Zetia but did not want to take it due to side effects of diarrhea.  PCSK9 inhibitors were discussed and cost information was reviewed.  Today, she***  ROS: Pertinent ROS in HPI  Studies Reviewed: .        None. Risk Assessment/Calculations:   {Does this patient have ATRIAL FIBRILLATION?:[id number]} No BP recorded.  {Refresh Note OR Click here to enter BP  :1}***       Physical Exam:   VS:  There were no vitals taken for this visit.   Wt Readings from Last 3 Encounters:  11/09/22 234 lb (106.1 kg)  09/07/22 237 lb 3.2 oz (107.6 kg)  06/19/22 237 lb (107.5 kg)    GEN: Well nourished, well developed in no acute distress NECK: No JVD; No carotid bruits CARDIAC: ***RRR, no murmurs, rubs, gallops RESPIRATORY:  Clear to auscultation without rales, wheezing or rhonchi  ABDOMEN: Soft, non-tender, non-distended EXTREMITIES:  No edema; No deformity   ASSESSMENT AND PLAN: .   Hyperlipidemia PSVT Diabetes mellitus Raynaud's disease    {Are you ordering a CV Procedure (e.g. stress  test, cath, DCCV, TEE, etc)?   Press F2        :[id number]}  Dispo: ***  Signed, Sharlene Dory, PA-C

## 2022-12-11 ENCOUNTER — Encounter: Payer: Self-pay | Admitting: Physician Assistant

## 2022-12-11 ENCOUNTER — Ambulatory Visit: Payer: PPO | Attending: Physician Assistant | Admitting: Physician Assistant

## 2022-12-11 VITALS — BP 122/68 | HR 85 | Ht 62.6 in | Wt 235.6 lb

## 2022-12-11 DIAGNOSIS — I471 Supraventricular tachycardia, unspecified: Secondary | ICD-10-CM

## 2022-12-11 DIAGNOSIS — E119 Type 2 diabetes mellitus without complications: Secondary | ICD-10-CM | POA: Diagnosis not present

## 2022-12-11 DIAGNOSIS — I10 Essential (primary) hypertension: Secondary | ICD-10-CM | POA: Diagnosis not present

## 2022-12-11 DIAGNOSIS — E785 Hyperlipidemia, unspecified: Secondary | ICD-10-CM | POA: Diagnosis not present

## 2022-12-11 DIAGNOSIS — I7300 Raynaud's syndrome without gangrene: Secondary | ICD-10-CM

## 2022-12-11 MED ORDER — METOPROLOL SUCCINATE ER 50 MG PO TB24: 50.0000 mg | ORAL_TABLET | Freq: Every day | ORAL | 3 refills | Status: DC

## 2022-12-11 NOTE — Patient Instructions (Signed)
Medication Instructions:  Your physician recommends that you continue on your current medications as directed. Please refer to the Current Medication list given to you today.  *If you need a refill on your cardiac medications before your next appointment, please call your pharmacy*   Lab Work: None ordered  If you have labs (blood work) drawn today and your tests are completely normal, you will receive your results only by: MyChart Message (if you have MyChart) OR A paper copy in the mail If you have any lab test that is abnormal or we need to change your treatment, we will call you to review the results.   Testing/Procedures: None ordered   Follow-Up: At [facility], you and your health needs are our priority.  As part of our continuing mission to provide you with exceptional heart care, we have created designated Provider Care Teams.  These Care Teams include your primary Cardiologist (physician) and Advanced Practice Providers (APPs -  Physician Assistants and Nurse Practitioners) who all work together to provide you with the care you need, when you need it.  We recommend signing up for the patient portal called "MyChart".  Sign up information is provided on this After Visit Summary.  MyChart is used to connect with patients for Virtual Visits (Telemedicine).  Patients are able to view lab/test results, encounter notes, upcoming appointments, etc.  Non-urgent messages can be sent to your provider as well.   To learn more about what you can do with MyChart, go to [email or web address].    Your next appointment:   1 year(s)  Provider:   Jove Skains, MD     Other Instructions   

## 2022-12-18 ENCOUNTER — Encounter: Payer: Self-pay | Admitting: Internal Medicine

## 2022-12-18 ENCOUNTER — Ambulatory Visit: Payer: PPO | Admitting: Internal Medicine

## 2022-12-18 VITALS — BP 122/80 | HR 80 | Ht 62.6 in | Wt 236.0 lb

## 2022-12-18 DIAGNOSIS — Z7984 Long term (current) use of oral hypoglycemic drugs: Secondary | ICD-10-CM

## 2022-12-18 DIAGNOSIS — E1165 Type 2 diabetes mellitus with hyperglycemia: Secondary | ICD-10-CM

## 2022-12-18 DIAGNOSIS — E039 Hypothyroidism, unspecified: Secondary | ICD-10-CM

## 2022-12-18 LAB — POCT GLYCOSYLATED HEMOGLOBIN (HGB A1C): Hemoglobin A1C: 7.3 % — AB (ref 4.0–5.6)

## 2022-12-18 MED ORDER — GLIMEPIRIDE 4 MG PO TABS: 4.0000 mg | ORAL_TABLET | Freq: Every day | ORAL | 3 refills | Status: DC

## 2022-12-18 MED ORDER — FREESTYLE LITE TEST VI STRP: 1.0000 | ORAL_STRIP | Freq: Every day | 3 refills | Status: AC

## 2022-12-18 MED ORDER — SYNTHROID 100 MCG PO TABS: 100.0000 ug | ORAL_TABLET | Freq: Every day | ORAL | 3 refills | Status: DC

## 2022-12-18 NOTE — Patient Instructions (Addendum)
-   Decrease  Glimepiride 4 mg, 1  tablets before breakfast      HOW TO TREAT LOW BLOOD SUGARS (Blood sugar LESS THAN 70 MG/DL) Please follow the RULE OF 15 for the treatment of hypoglycemia treatment (when your (blood sugars are less than 70 mg/dL)   STEP 1: Take 15 grams of carbohydrates when your blood sugar is low, which includes:  3-4 GLUCOSE TABS  OR 3-4 OZ OF JUICE OR REGULAR SODA OR ONE TUBE OF GLUCOSE GEL    STEP 2: RECHECK blood sugar in 15 MINUTES STEP 3: If your blood sugar is still low at the 15 minute recheck --> then, go back to STEP 1 and treat AGAIN with another 15 grams of carbohydrates.

## 2022-12-18 NOTE — Progress Notes (Signed)
Name: Jill Dean  Age/ Sex: 77 y.o., female   MRN/ DOB: [id number], [birth year]     PCP: Lorenda Ishihara, MD   Reason for Endocrinology Evaluation: Type 2 Diabetes Mellitus  Initial Endocrine Consultative Visit: 04/18/2019    PATIENT IDENTIFIER: Jill Dean is a 77 y.o. female with a past medical history of T2DM, Hypothyroidism and Dyslipidemia. The patient has followed with Endocrinology clinic since 04/18/2019 for consultative assistance with management of her diabetes.  DIABETIC HISTORY:  Jill Dean was diagnosed with DM in 2013. Metformin - GI issues, Jardiance - recurrent yeast infection, Glipizide- Hives . Her hemoglobin A1c has ranged from 7.0% in 2015, peaking at 7.8% in 2020.  In her initial visit to our clinic she had an A1c of 7.0%, we stopped Jardiance due to recurrent yeast infections. She is allergic due to Glipizide  Stop Rybelsus March 2023 due to pruritus, we attempted again later in 2023 with similar results  SUBJECTIVE:   During the last visit (06/19/2022): A1c 7.3%    Today (12/18/2022): Jill Dean is here for a follow up on diabetes management.  She checks her blood sugars multiple  times daily. The patient has had hypoglycemic on freestyle libre, she is symptomatic with these episodes.   She continues to follow-up with cardiology for SVT  Denies constipation or diarrhea  Denies nausea or vomiting  Rash has resolved    HOME DIABETES REGIMEN:  Glimepiride 4 mg, 1.5 tab daily     Statin: Intolerant ACE-I/ARB: no    CONTINUOUS GLUCOSE MONITORING RECORD INTERPRETATION    Dates of Recording: 12/3-12/16/2024  Sensor description: [employer] 2  Results statistics:   CGM use % of time 90  Average and SD 146/21.1  Time in range  87 %  % Time Above 180 13  % Time above 250 0  % Time Below target 0   Glycemic patterns summary: Optimal BG's overnight and fluctuate during the day Hyperglycemic episodes postprandial  Hypoglycemic episodes  occurred during the day  Overnight periods: Optimal    DIABETIC COMPLICATIONS: Microvascular complications:    Denies: CKD, retinopathy , neuropathy  Last eye exam: Completed 07/18/2021   Macrovascular complications:    Denies: CAD, PVD, CVA     HISTORY:  Past Medical History:  Past Medical History:  Diagnosis Date   Chest pain    a. s/p normal ett nuclear stress test on 10/16/13   Complication of anesthesia    COVID-19 12/2019   Diabetes mellitus without complication (HCC)    Dysrhythmia    fast heart rate   GERD (gastroesophageal reflux disease)    HLD (hyperlipidemia)    Hypothyroidism    Obesity    Osteoarthrosis, unspecified whether generalized or localized, unspecified site    Other chronic allergic conjunctivitis    PONV (postoperative nausea and vomiting)    Raynaud's syndrome    Urticaria, unspecified    Past Surgical History:  Past Surgical History:  Procedure Laterality Date   CATARACT EXTRACTION Bilateral 2023   CHOLECYSTECTOMY     CYST EXCISION Left 11/10/2014   Procedure: EXCISION CYST LEFT INDEX FINGER AND CYST LEFT SMALL FINGER ;  Surgeon: Cindee Salt, MD;  Location: [facility];  Service: Orthopedics;  Laterality: Left;   ESOPHAGOGASTRODUODENOSCOPY N/A 01/05/2014   Procedure: ESOPHAGOGASTRODUODENOSCOPY (EGD);  Surgeon: Charolett Bumpers, MD;  Location: [facility];  Service: Endoscopy;  Laterality: N/A;   knot right breast     LEFT HEART CATHETERIZATION WITH CORONARY ANGIOGRAM N/A  10/22/2013   Procedure: LEFT HEART CATHETERIZATION WITH CORONARY ANGIOGRAM;  Surgeon: Iran Ouch, MD;  Location: [facility];  Service: Cardiovascular;  Laterality: N/A;   OOPHORECTOMY     LSO 84-RSO 04   thyroid nodule     TONSILLECTOMY     VAGINAL HYSTERECTOMY  01/02/2002   LAVH RSO endometriosis   VESICOVAGINAL FISTULA CLOSURE W/ TAH     Social History:  reports that she has never smoked. She has never used smokeless tobacco. She reports  current alcohol use. She reports that she does not use drugs. Family History:  Family History  Problem Relation Age of Onset   Lung cancer Father    Heart disease Father    Heart failure Mother    Dementia Mother    Heart disease Maternal Uncle    Lung cancer Paternal Uncle    COPD Paternal Uncle      HOME MEDICATIONS: Allergies as of 12/18/2022       Reactions   Hydrocod Poli-chlorphe Poli Er Other (See Comments)   hyperactivity   Pseudoephedrine Itching, Palpitations, Other (See Comments)   REACTION: tachycardia   Sulfonamide Derivatives Anaphylaxis, Swelling   Azithromycin Itching   Buprenorphine Hcl Itching   Codeine Itching   Dilaudid [hydromorphone Hcl] Itching   Macrodantin [nitrofurantoin] Itching   Hands and feet itching. "Trouble breathing" w/lump in throat and stuffy nose.   Morphine And Codeine Itching   Shrimp Extract Itching   Shrimp [shellfish Allergy] Itching   Simvastatin Other (See Comments)   Fatigue, depression and dizziness at 20 mg   Atorvastatin    Muscle cramps  And her blood sugar kept going up   Cefuroxime    Other Reaction(s): swelling eyes   Cephalosporins Swelling   Said to cause facial swelling   Covid-19 (adenovirus) Vaccine    Other reaction(s): rash, itching, skin tight   Empagliflozin    Other reaction(s): yeast infection   Erythromycin Base Itching, Swelling   Hydrocod Poli-chlorphe Poli Er    Other reaction(s): flushing and facial edema   Hydrocodone-acetaminophen Other (See Comments)   Redness of face, swelling of tolerates tylenol   Iodine    Other Reaction(s): Unknown   Mercaptobenzothiazole    Other Reaction(s): Unknown   Metformin Hcl    Other reaction(s): epigastric pain   Metronidazole    Other reaction(s): tonsil swelling   Other    Rybelsus---anaphylaxsis   Penicillinase Itching   Quinolones    Other Reaction(s): Unknown   Semaglutide Itching   Statins    Other Reaction(s): dizziness,felt depressed, myalgia,  Other (See Comments) Muscle cramps , And her blood sugar kept going up   Sulfamethoxazole Itching   Tetanus-diphtheria Toxoids Td    Other Reaction(s): hand itching   Zoster Vac Recomb Adjuvanted    Other Reaction(s): skin rash   Clarithromycin Itching   REACTION: severe itching        Medication List        Accurate as of December 18, 2022 12:03 PM. If you have any questions, ask your nurse or doctor.          AMBULATORY NON FORMULARY MEDICATION Take 1 capsule by mouth daily at 6 (six) AM. Medication Name: recuperate IQ Copper, beef liver powder, spirulina, tumeric, boron   amitriptyline 25 MG tablet Commonly known as: ELAVIL Take 25 mg by mouth at bedtime.   cetirizine 10 MG tablet Commonly known as: ZYRTEC Take 10 mg by mouth daily.   Cod Liver Oil Caps  Take 1 capsule by mouth daily.   EPINEPHrine 0.3 mg/0.3 mL Soaj injection Commonly known as: EPI-PEN Inject 0.3 mg into the muscle as needed for anaphylaxis.   fluticasone 50 MCG/ACT nasal spray Commonly known as: FLONASE Place into both nostrils daily.   FreeStyle Libre 2 Sensor Misc APPLY 1 DEVICE EVERY 14 DAYS AS DIRECTED   FREESTYLE LITE test strip Generic drug: glucose blood 1 each by Other route daily in the afternoon. Use as instructed   glimepiride 4 MG tablet Commonly known as: AMARYL Change Glimepiride 4 mg, to one and a half tablets before breakfast   ibuprofen 200 MG tablet Commonly known as: ADVIL Take 400 mg by mouth at bedtime as needed for moderate pain or mild pain.   levothyroxine 100 MCG tablet Commonly known as: SYNTHROID Take 1 tablet (100 mcg total) by mouth daily.   Lutein 20 MG Caps Take 1 capsule by mouth daily.   Magnesium 400 MG Caps Take 1 tablet by mouth daily.   metoprolol succinate 50 MG 24 hr tablet Commonly known as: TOPROL-XL Take 1 tablet (50 mg total) by mouth daily. Take with or immediately following a meal.   MVW COMPLETE PROBIOTIC PO Take 1 capsule by  mouth daily.   NON FORMULARY Take 1 tablet by mouth daily in the afternoon.   omeprazole 40 MG capsule Commonly known as: PRILOSEC Take 40 mg by mouth daily.   rosuvastatin 10 MG tablet Commonly known as: CRESTOR Take 10 mg by mouth daily.   YUMVS BEET ROOT-TART CHERRY PO Take 1 capsule by mouth daily at 6 (six) AM.         OBJECTIVE:   Vital Signs: BP 122/80 (BP Location: Left Arm, Patient Position: Sitting, Cuff Size: Large)   Pulse 80   Ht 5' 2.6" (1.59 m)   Wt 236 lb (107 kg)   SpO2 99%   BMI 42.34 kg/m   Wt Readings from Last 3 Encounters:  12/18/22 236 lb (107 kg)  12/11/22 235 lb 10.1 oz (106.9 kg)  11/09/22 234 lb (106.1 kg)     Exam: General: Pt appears well and is in NAD  Lungs: Clear with good BS bilat   Heart: RRR   Extremities: Trace  pretibial edema.   Neuro: MS is good with appropriate affect, pt is alert and Ox3   DM foot exam:06/19/2022   The skin of the feet without sores or ulcerations. Plantar callous formation noted bilaterally  The pedal pulses are 2+ on right and 2+ on left. The sensation is intact to a screening 5.07, 10 gram monofilament bilaterally     DATA REVIEWED:  Lab Results  Component Value Date   HGBA1C 7.3 (A) 12/18/2022   HGBA1C 7.3 (A) 06/19/2022   HGBA1C 7.1 (A) 12/05/2021   06/19/2022 TSH 2.6  09/25/2022 Tg 295 LDL 160  HFR 62    ASSESSMENT / PLAN / RECOMMENDATIONS:   1) Type 2 Diabetes Mellitus, Optimally  controlled, Without complications - Most recent A1c of 7.3  %. Goal A1c < 7.0 %.    -A1c continues to be above goal despite increasing glimepiride on the last visit, unfortunately, she is having hypoglycemic episodes, we will need to decrease her glimepiride, I suspect her A1c will increase. -Patient encouraged to reduce the amount of CHO intake and continue with exercise -Patient with multiple allergic reaction/intolerance to multiple glycemic agents - Patient is intolerant to Jardiance due to  recurrent vaginal yeast infections, and intolerant to Metformin as well as  Glipizide - rash  - Intolerant to Rybelsus-rash     MEDICATIONS:  -Decrease glimepiride  4 mg to 1 tab  daily   EDUCATION / INSTRUCTIONS: BG monitoring instructions: Patient is instructed to check her blood sugars 3 times a day. Call [facility] if: BG persistently < 70      2) Diabetic complications:  Eye: Does not  have known diabetic retinopathy.  Neuro/ Feet: Does not have known diabetic peripheral neuropathy .  Renal: Patient does not have known baseline CKD. She   is not on an ACEI/ARB at present.   3) Hypothyroidism:   - TSH is normal  - Continue Synthroid 100 mcg daily      F/U in 6 months    Signed electronically by: Lyndle Herrlich, MD  [facility] Endocr[facility]  [facility] [address] [place] [place], [place] [address] Phone: [phone] FAX: [phone]   CC: Lorenda Ishihara, MD [address] [place] [address] Phone: [phone]  Fax: [phone]  Return to Endocrinology clinic as below: Future Appointments  Date Time Provider Department Center  09/10/2023 10:30 AM Waymon Budge, MD [facility] None

## 2022-12-19 ENCOUNTER — Other Ambulatory Visit: Payer: Self-pay

## 2022-12-19 MED ORDER — ONETOUCH ULTRA BLUE TEST VI STRP: ORAL_STRIP | 12 refills | Status: DC

## 2022-12-21 DIAGNOSIS — H26491 Other secondary cataract, right eye: Secondary | ICD-10-CM | POA: Diagnosis not present

## 2023-01-04 ENCOUNTER — Encounter: Payer: Self-pay | Admitting: Nurse Practitioner

## 2023-01-04 ENCOUNTER — Ambulatory Visit: Payer: PPO | Admitting: Nurse Practitioner

## 2023-01-04 VITALS — BP 116/70 | HR 92

## 2023-01-04 DIAGNOSIS — N898 Other specified noninflammatory disorders of vagina: Secondary | ICD-10-CM

## 2023-01-04 DIAGNOSIS — B3731 Acute candidiasis of vulva and vagina: Secondary | ICD-10-CM

## 2023-01-04 LAB — WET PREP FOR TRICH, YEAST, CLUE

## 2023-01-04 MED ORDER — FLUCONAZOLE 150 MG PO TABS: 150.0000 mg | ORAL_TABLET | ORAL | 0 refills | Status: DC

## 2023-01-04 NOTE — Progress Notes (Signed)
   Acute Office Visit  Subjective:    Patient ID: Jill Dean, female    DOB: 11-18-1945, 78 y.o.   MRN: [id number]   HPI 78 y.o. presents today for vaginal burning. Treated for yeast 11/09/22 with Diflucan , symptoms resolved and then returned. Again resolved with OTC monistat and returned. Reports switching to scented panty liners prior to when this all started but has recently swtiched back to non-scented.   No LMP recorded. Patient has had a hysterectomy.    Review of Systems  Constitutional: Negative.   Genitourinary:  Positive for vaginal pain (Burning). Negative for vaginal discharge.       Objective:    Physical Exam Constitutional:      Appearance: Normal appearance.  Genitourinary:    General: Normal vulva.     Vagina: Vaginal discharge present. No erythema.     BP 116/70   Pulse 92   SpO2 98%  Wt Readings from Last 3 Encounters:  12/18/22 236 lb (107 kg)  12/11/22 235 lb 10.1 oz (106.9 kg)  11/09/22 234 lb (106.1 kg)        Patient informed chaperone available to be present for breast and/or pelvic exam. Patient has requested no chaperone to be present. Patient has been advised what will be completed during breast and pelvic exam.   Wet prep +yeast  Assessment & Plan:   Problem List Items Addressed This Visit   None Visit Diagnoses       Vaginal candidiasis    -  Primary   Relevant Medications   fluconazole  (DIFLUCAN ) 150 MG tablet     Vaginal irritation       Relevant Orders   WET PREP FOR TRICH, YEAST, CLUE      Plan: Wet prep positive for yeast - Diflucan  150 mg every 3 days for 3 doses. If symptoms return, will try Terazol. Avoid harsh soaps, detergents or body washes, use non-scented/sensitive panty liners.    Return if symptoms worsen or fail to improve.    Jill DELENA Shutter DNP, 2:45 PM 01/04/2023

## 2023-01-09 ENCOUNTER — Telehealth: Payer: Self-pay | Admitting: Internal Medicine

## 2023-01-09 MED ORDER — ONETOUCH ULTRA BLUE TEST VI STRP: ORAL_STRIP | 12 refills | Status: DC

## 2023-01-09 NOTE — Telephone Encounter (Signed)
 MEDICATION: OneTouch Ultra Blue Test glucose blood (ONETOUCH ULTRA BLUE TEST) test strip  PHARMACY:    [id number] - [place], [place] - [address] AT [facility] (Ph: [phone])    HAS THE PATIENT CONTACTED THEIR PHARMACY?  Yes  IS THIS A 90 DAY SUPPLY : Yes  IS PATIENT OUT OF MEDICATION: Yes  IF NOT; HOW MUCH IS LEFT:   LAST APPOINTMENT DATE: @ 12/18/2022  NEXT APPOINTMENT DATE:@6 /10/2023  DO WE HAVE YOUR PERMISSION TO LEAVE A DETAILED MESSAGE?:Yes  OTHER COMMENTS: Patient states that prescription that was sent to pharmacy was for the wrong test strips.  Patient states that she uses the One Touch and NOT the FreeStyle Lite.   **Let patient know to contact pharmacy at the end of the day to make sure medication is ready. **  ** Please notify patient to allow 48-72 hours to process**  **Encourage patient to contact the pharmacy for refills or they can request refills through [facility]**

## 2023-01-15 ENCOUNTER — Other Ambulatory Visit: Payer: Self-pay | Admitting: Nurse Practitioner

## 2023-01-15 ENCOUNTER — Ambulatory Visit: Payer: PPO | Admitting: Nurse Practitioner

## 2023-01-15 ENCOUNTER — Encounter: Payer: Self-pay | Admitting: Nurse Practitioner

## 2023-01-15 VITALS — BP 118/62 | HR 91 | Wt 235.0 lb

## 2023-01-15 DIAGNOSIS — R3915 Urgency of urination: Secondary | ICD-10-CM | POA: Diagnosis not present

## 2023-01-15 DIAGNOSIS — B3731 Acute candidiasis of vulva and vagina: Secondary | ICD-10-CM | POA: Diagnosis not present

## 2023-01-15 DIAGNOSIS — N760 Acute vaginitis: Secondary | ICD-10-CM | POA: Diagnosis not present

## 2023-01-15 LAB — URINALYSIS, COMPLETE W/RFL CULTURE
Bacteria, UA: NONE SEEN /[HPF]
Bilirubin Urine: NEGATIVE
Glucose, UA: NEGATIVE
Hgb urine dipstick: NEGATIVE
Hyaline Cast: NONE SEEN /[LPF]
Ketones, ur: NEGATIVE
Leukocyte Esterase: NEGATIVE
Nitrites, Initial: NEGATIVE
Protein, ur: NEGATIVE
RBC / HPF: NONE SEEN /[HPF] (ref 0–2)
Specific Gravity, Urine: 1.004 (ref 1.001–1.035)
pH: 5.5 (ref 5.0–8.0)

## 2023-01-15 LAB — WET PREP FOR TRICH, YEAST, CLUE

## 2023-01-15 MED ORDER — TERCONAZOLE 0.4 % VA CREA: 1.0000 | TOPICAL_CREAM | Freq: Every day | VAGINAL | 0 refills | Status: AC

## 2023-01-15 NOTE — Addendum Note (Signed)
 Addended byWyline Beady on: 01/15/2023 12:17 PM   Modules accepted: Orders

## 2023-01-15 NOTE — Progress Notes (Addendum)
   Acute Office Visit  Subjective:    Patient ID: Jill Dean, female    DOB: 1945-09-03, 78 y.o.   MRN: [id number]   HPI 78 y.o. presents today for possible urinary urgency and burning. Denies vaginal itching or discharge. Treated 01/03/22 for yeast with Diflucan  x 3 (took last dose 3 days ago). Does not feel symptoms changed much. Also treated for yeast in November 2024 with Diflucan . Reports something just does not feel right.   No LMP recorded. Patient has had a hysterectomy.    Review of Systems  Constitutional: Negative.   Genitourinary:  Positive for dysuria and urgency. Negative for difficulty urinating, flank pain, frequency, hematuria, vaginal discharge and vaginal pain.       Objective:    Physical Exam Constitutional:      Appearance: Normal appearance.  Genitourinary:    General: Normal vulva.     Vagina: Vaginal discharge present. No erythema.     BP 118/62 (BP Location: Left Arm, Patient Position: Sitting, Cuff Size: Normal)   Pulse 91   Wt 235 lb (106.6 kg)   SpO2 96%   BMI 42.16 kg/m  Wt Readings from Last 3 Encounters:  01/15/23 235 lb (106.6 kg)  12/18/22 236 lb (107 kg)  12/11/22 235 lb 10.1 oz (106.9 kg)        Patient informed chaperone available to be present for breast and/or pelvic exam. Patient has requested no chaperone to be present. Patient has been advised what will be completed during breast and pelvic exam.   UA negative Wet prep + yeast (budding present)  Assessment & Plan:   Problem List Items Addressed This Visit   None Visit Diagnoses       Vaginal candidiasis    -  Primary   Relevant Medications   terconazole  (TERAZOL 7 ) 0.4 % vaginal cream     Urgency of urination       Relevant Orders   Urinalysis,Complete w/RFL Culture (Completed)     Recurrent vaginitis       Relevant Orders   WET PREP FOR TRICH, YEAST, CLUE      Plan: Yeast present on wet prep. Terazol 0.4% nightly x 7 days. Yeast culture pending.   Return if  symptoms worsen or fail to improve.    Jill DELENA Shutter DNP, 12:18 PM 01/15/2023

## 2023-01-16 LAB — SURESWAB® ADVANCED CANDIDA VAGINITIS (CV), TMA
CANDIDA SPECIES: NOT DETECTED
Candida glabrata: DETECTED — AB

## 2023-01-17 ENCOUNTER — Other Ambulatory Visit: Payer: Self-pay | Admitting: Nurse Practitioner

## 2023-01-17 DIAGNOSIS — B379 Candidiasis, unspecified: Secondary | ICD-10-CM

## 2023-01-17 MED ORDER — BORIC ACID VAGINAL 600 MG VA SUPP: 1.0000 | Freq: Every evening | VAGINAL | 0 refills | Status: DC

## 2023-01-31 ENCOUNTER — Other Ambulatory Visit: Payer: Self-pay | Admitting: Nurse Practitioner

## 2023-01-31 ENCOUNTER — Telehealth: Payer: Self-pay

## 2023-01-31 DIAGNOSIS — B379 Candidiasis, unspecified: Secondary | ICD-10-CM

## 2023-01-31 MED ORDER — NYSTATIN 100000 UNIT/GM EX CREA: TOPICAL_CREAM | CUTANEOUS | 0 refills | Status: AC

## 2023-01-31 NOTE — Telephone Encounter (Signed)
Patient states she would like to give it a try. [facility] use [facility]. You can print the rx & I will fax it to them

## 2023-01-31 NOTE — Telephone Encounter (Signed)
Left message to call back

## 2023-01-31 NOTE — Telephone Encounter (Signed)
Can switch to nystatin vaginal suppositories for 21 days. This will likely have to be sent to a compounding pharmacy.

## 2023-01-31 NOTE — Telephone Encounter (Signed)
Printed.  Thanks.

## 2023-01-31 NOTE — Telephone Encounter (Signed)
Pt LVM in triage line stating that she was recently recommended to take boric acid suppositories and since has been using, has been having troubles with itching & burning of hands/feet. Reports that she is taking bendryl and is getting relief so is sure that this is some sort of an allergy.   Pt requesting a cb w/ advice. Has 21 more days of the rx.

## 2023-02-01 NOTE — Telephone Encounter (Signed)
Pt LVM in triage line stating that she said that the pharmacy did not receive the rx yet.   Per CMA w/ TW today [facility]).  Rx has been faxed and she received confirmation just a second ago.   Per [facility] w/ [facility], rx was received and that they will start working on it now.   Pt notified and voiced understanding.  Encounter closed.

## 2023-02-06 ENCOUNTER — Other Ambulatory Visit: Payer: Self-pay

## 2023-02-06 MED ORDER — FREESTYLE LIBRE 3 SENSOR MISC: 3 refills | Status: DC

## 2023-02-06 NOTE — Telephone Encounter (Signed)
Libre 3 sent per patient request

## 2023-03-29 ENCOUNTER — Other Ambulatory Visit (HOSPITAL_COMMUNITY): Payer: Self-pay | Admitting: Internal Medicine

## 2023-03-29 DIAGNOSIS — I4719 Other supraventricular tachycardia: Secondary | ICD-10-CM | POA: Diagnosis not present

## 2023-03-29 DIAGNOSIS — E039 Hypothyroidism, unspecified: Secondary | ICD-10-CM | POA: Diagnosis not present

## 2023-03-29 DIAGNOSIS — R0609 Other forms of dyspnea: Secondary | ICD-10-CM | POA: Diagnosis not present

## 2023-03-29 DIAGNOSIS — M85859 Other specified disorders of bone density and structure, unspecified thigh: Secondary | ICD-10-CM | POA: Diagnosis not present

## 2023-03-29 DIAGNOSIS — I739 Peripheral vascular disease, unspecified: Secondary | ICD-10-CM | POA: Diagnosis not present

## 2023-03-29 DIAGNOSIS — E785 Hyperlipidemia, unspecified: Secondary | ICD-10-CM | POA: Diagnosis not present

## 2023-03-29 DIAGNOSIS — K219 Gastro-esophageal reflux disease without esophagitis: Secondary | ICD-10-CM | POA: Diagnosis not present

## 2023-03-29 DIAGNOSIS — E1165 Type 2 diabetes mellitus with hyperglycemia: Secondary | ICD-10-CM | POA: Diagnosis not present

## 2023-03-29 DIAGNOSIS — E559 Vitamin D deficiency, unspecified: Secondary | ICD-10-CM | POA: Diagnosis not present

## 2023-03-29 DIAGNOSIS — E1151 Type 2 diabetes mellitus with diabetic peripheral angiopathy without gangrene: Secondary | ICD-10-CM | POA: Diagnosis not present

## 2023-03-29 DIAGNOSIS — Z0000 Encounter for general adult medical examination without abnormal findings: Secondary | ICD-10-CM | POA: Diagnosis not present

## 2023-03-29 DIAGNOSIS — Z13220 Encounter for screening for lipoid disorders: Secondary | ICD-10-CM | POA: Diagnosis not present

## 2023-05-01 ENCOUNTER — Ambulatory Visit (HOSPITAL_COMMUNITY): Attending: Internal Medicine

## 2023-05-01 DIAGNOSIS — R0609 Other forms of dyspnea: Secondary | ICD-10-CM | POA: Insufficient documentation

## 2023-05-03 LAB — ECHOCARDIOGRAM COMPLETE
Area-P 1/2: 5.2 cm2
P 1/2 time: 520 ms
S' Lateral: 3.3 cm

## 2023-05-07 DIAGNOSIS — E785 Hyperlipidemia, unspecified: Secondary | ICD-10-CM | POA: Diagnosis not present

## 2023-05-30 ENCOUNTER — Encounter: Payer: Self-pay | Admitting: Nurse Practitioner

## 2023-05-30 ENCOUNTER — Ambulatory Visit: Admitting: Nurse Practitioner

## 2023-05-30 ENCOUNTER — Ambulatory Visit: Payer: Self-pay | Admitting: Nurse Practitioner

## 2023-05-30 VITALS — BP 114/64 | HR 92

## 2023-05-30 DIAGNOSIS — N898 Other specified noninflammatory disorders of vagina: Secondary | ICD-10-CM

## 2023-05-30 DIAGNOSIS — N760 Acute vaginitis: Secondary | ICD-10-CM

## 2023-05-30 DIAGNOSIS — R102 Pelvic and perineal pain: Secondary | ICD-10-CM | POA: Diagnosis not present

## 2023-05-30 LAB — URINALYSIS, COMPLETE W/RFL CULTURE
Bacteria, UA: NONE SEEN /HPF
Bilirubin Urine: NEGATIVE
Glucose, UA: NEGATIVE
Hgb urine dipstick: NEGATIVE
Hyaline Cast: NONE SEEN /LPF
Ketones, ur: NEGATIVE
Leukocyte Esterase: NEGATIVE
Nitrites, Initial: NEGATIVE
Protein, ur: NEGATIVE
RBC / HPF: NONE SEEN /HPF (ref 0–2)
Specific Gravity, Urine: 1.006 (ref 1.001–1.035)
WBC, UA: NONE SEEN /HPF (ref 0–5)
pH: 5.5 (ref 5.0–8.0)

## 2023-05-30 LAB — NO CULTURE INDICATED

## 2023-05-30 LAB — WET PREP FOR TRICH, YEAST, CLUE

## 2023-05-30 NOTE — Progress Notes (Signed)
   Acute Office Visit  Subjective:    Patient ID: Jill Dean, female    DOB: [birth year], 78 y.o.   MRN: [id number]   HPI 78 y.o. presents today for vaginal burning and pelvic pressure x 3 days. Denies discharge or odor. + Glabrata in January - completed 2 weeks of vaginal nystatin . Symptoms fully resolved until now. Did not tolerate boric acid. Going to [employer], so she wants to make sure she does not have an infection. Wears incontinence pads. Uses sensitive ones.   No LMP recorded. Patient has had a hysterectomy.    Review of Systems  Constitutional: Negative.   Genitourinary:  Positive for vaginal discharge and vaginal pain (Burning). Negative for dysuria, flank pain, frequency, hematuria and urgency.       Objective:     Physical Exam Constitutional:      Appearance: Normal appearance.  Genitourinary:    General: Normal vulva.     Vagina: Normal.     BP 114/64 (BP Location: Right Arm, Patient Position: Sitting)   Pulse 92   SpO2 93%  Wt Readings from Last 3 Encounters:  01/15/23 235 lb (106.6 kg)  12/18/22 236 lb (107 kg)  12/11/22 235 lb 10.1 oz (106.9 kg)        [name], NP student present as chaperone.   Wet prep negative for pathogens  UA negative  Assessment & Plan:   Problem List Items Addressed This Visit   None Visit Diagnoses       Vaginal itching    -  Primary   Relevant Orders   WET PREP FOR TRICH, YEAST, CLUE     Recurrent vaginitis       Relevant Orders   SureSwab Advanced Candida Vaginitis (CV), TMA     Pelvic pressure in female       Relevant Orders   Urinalysis,Complete w/RFL Culture      Plan: Wet prep, UA and exam normal. Yeast culture pending. Can apply hydrocortisone BID x 7 days.   Return if symptoms worsen or fail to improve.    Andee Bamberger DNP, 2:20 PM 05/30/2023

## 2023-05-31 LAB — SURESWAB® ADVANCED CANDIDA VAGINITIS (CV), TMA
CANDIDA SPECIES: NOT DETECTED
Candida glabrata: NOT DETECTED

## 2023-06-12 ENCOUNTER — Ambulatory Visit: Payer: PPO | Admitting: Internal Medicine

## 2023-06-12 ENCOUNTER — Encounter: Payer: Self-pay | Admitting: Internal Medicine

## 2023-06-12 VITALS — BP 124/80 | HR 78 | Ht 62.6 in | Wt 233.0 lb

## 2023-06-12 DIAGNOSIS — E039 Hypothyroidism, unspecified: Secondary | ICD-10-CM | POA: Diagnosis not present

## 2023-06-12 DIAGNOSIS — Z7984 Long term (current) use of oral hypoglycemic drugs: Secondary | ICD-10-CM

## 2023-06-12 DIAGNOSIS — E119 Type 2 diabetes mellitus without complications: Secondary | ICD-10-CM | POA: Diagnosis not present

## 2023-06-12 LAB — POCT GLYCOSYLATED HEMOGLOBIN (HGB A1C): Hemoglobin A1C: 6.9 % — AB (ref 4.0–5.6)

## 2023-06-12 MED ORDER — DEXCOM G7 SENSOR MISC: 1.0000 | 3 refills | Status: DC

## 2023-06-12 MED ORDER — GLIMEPIRIDE 4 MG PO TABS: 6.0000 mg | ORAL_TABLET | Freq: Every day | ORAL | 3 refills | Status: DC

## 2023-06-12 MED ORDER — SYNTHROID 100 MCG PO TABS: 100.0000 ug | ORAL_TABLET | Freq: Every day | ORAL | 3 refills | Status: DC

## 2023-06-12 NOTE — Progress Notes (Signed)
 Name: Jill Dean  Age/ Sex: 78 y.o., female   MRN/ DOB: [id number], 1945/01/11     PCP: Arva Lathe, MD   Reason for Endocrinology Evaluation: Type 2 Diabetes Mellitus  Initial Endocrine Consultative Visit: 04/18/2019    PATIENT IDENTIFIER: Jill Dean is a 78 y.o. female with a past medical history of T2DM, Hypothyroidism and Dyslipidemia. The patient has followed with Endocrinology clinic since 04/18/2019 for consultative assistance with management of her diabetes.  DIABETIC HISTORY:  Jill Dean was diagnosed with DM in 2013. Metformin - GI issues, Jardiance - recurrent yeast infection, Glipizide - Hives . Her hemoglobin A1c has ranged from 7.0% in 2015, peaking at 7.8% in 2020.  In her initial visit to our clinic she had an A1c of 7.0%, we stopped Jardiance due to recurrent yeast infections. She is allergic due to Glipizide   Stop Rybelsus  March 2023 due to pruritus, we attempted again later in 2023 with similar results  SUBJECTIVE:   During the last visit (12/18/2022): A1c 7.3%    Today (06/12/2023): Jill Dean is here for a follow up on diabetes management.  She checks her blood sugars multiple  times daily. The patient has had hypoglycemic on freestyle libre, she is symptomatic with these episodes.   She continues to follow-up with cardiology for SVT  Denies constipation or diarrhea  Has nausea with hunger  No recent  skin rash    HOME DIABETES REGIMEN:  Glimepiride  4 mg, 1.5 tab daily     Statin: Intolerant ACE-I/ARB: no    CONTINUOUS GLUCOSE MONITORING RECORD INTERPRETATION    Dates of Recording: 5/28-6/10/2023  Sensor description: [employer] 2  Results statistics:   CGM use % of time 94  Average and SD 149/19.6  Time in range 87%  % Time Above 180 13  % Time above 250 0  % Time Below target 0   Glycemic patterns summary: Optimal BG's overnight and throughout the day Hyperglycemic episodes postprandial  Hypoglycemic episodes occurred  N/A  Overnight periods: Optimal    DIABETIC COMPLICATIONS: Microvascular complications:    Denies: CKD, retinopathy , neuropathy  Last eye exam: Completed 07/18/2021   Macrovascular complications:    Denies: CAD, PVD, CVA     HISTORY:  Past Medical History:  Past Medical History:  Diagnosis Date   Chest pain    a. s/p normal ett nuclear stress test on 10/16/13   Complication of anesthesia    COVID-19 12/2019   Diabetes mellitus without complication (HCC)    Dysrhythmia    fast heart rate   GERD (gastroesophageal reflux disease)    HLD (hyperlipidemia)    Hypothyroidism    Obesity    Osteoarthrosis, unspecified whether generalized or localized, unspecified site    Other chronic allergic conjunctivitis    PONV (postoperative nausea and vomiting)    Raynaud's syndrome    Urticaria, unspecified    Past Surgical History:  Past Surgical History:  Procedure Laterality Date   CATARACT EXTRACTION Bilateral 2023   CHOLECYSTECTOMY     CYST EXCISION Left 11/10/2014   Procedure: EXCISION CYST LEFT INDEX FINGER AND CYST LEFT SMALL FINGER ;  Surgeon: Lyanne Sample, MD;  Location: [facility];  Service: Orthopedics;  Laterality: Left;   ESOPHAGOGASTRODUODENOSCOPY N/A 01/05/2014   Procedure: ESOPHAGOGASTRODUODENOSCOPY (EGD);  Surgeon: Garrett Kallman, MD;  Location: [facility];  Service: Endoscopy;  Laterality: N/A;   knot right breast     LEFT HEART CATHETERIZATION WITH CORONARY ANGIOGRAM N/A 10/22/2013  Procedure: LEFT HEART CATHETERIZATION WITH CORONARY ANGIOGRAM;  Surgeon: Wenona Hamilton, MD;  Location: [facility];  Service: Cardiovascular;  Laterality: N/A;   OOPHORECTOMY     LSO 84-RSO 04   thyroid  nodule     TONSILLECTOMY     VAGINAL HYSTERECTOMY  01/02/2002   LAVH RSO endometriosis   VESICOVAGINAL FISTULA CLOSURE W/ TAH     Social History:  reports that she has never smoked. She has never used smokeless tobacco. She reports current alcohol use. She  reports that she does not use drugs. Family History:  Family History  Problem Relation Age of Onset   Lung cancer Father    Heart disease Father    Heart failure Mother    Dementia Mother    Heart disease Maternal Uncle    Lung cancer Paternal Uncle    COPD Paternal Uncle      HOME MEDICATIONS: Allergies as of 06/12/2023       Reactions   Hydrocod Poli-chlorphe Poli Er Other (See Comments)   hyperactivity   Pseudoephedrine Itching, Palpitations, Other (See Comments)   REACTION: tachycardia   Sulfonamide Derivatives Anaphylaxis, Swelling   Azithromycin Itching   Buprenorphine Hcl Itching   Codeine Itching   Dilaudid [hydromorphone Hcl] Itching   Macrodantin  [nitrofurantoin ] Itching   Hands and feet itching. "Trouble breathing" w/lump in throat and stuffy nose.   Morphine And Codeine Itching   Shrimp Extract Itching   Shrimp [shellfish Allergy ] Itching   Simvastatin Other (See Comments)   Fatigue, depression and dizziness at 20 mg   Atorvastatin     Muscle cramps  And her blood sugar kept going up   Cefuroxime    Other Reaction(s): swelling eyes   Cephalosporins Swelling   Said to cause facial swelling   Covid-19 (adenovirus) Vaccine    Other reaction(s): rash, itching, skin tight   Empagliflozin    Other reaction(s): yeast infection   Erythromycin Base Itching, Swelling   Hydrocod Poli-chlorphe Poli Er    Other reaction(s): flushing and facial edema   Hydrocodone-acetaminophen  Other (See Comments)   Redness of face, swelling of tolerates tylenol    Iodine    Other Reaction(s): Unknown   Mercaptobenzothiazole    Other Reaction(s): Unknown   Metformin Hcl    Other reaction(s): epigastric pain   Metronidazole     Other reaction(s): tonsil swelling   Other    Rybelsus ---anaphylaxsis   Penicillinase Itching   Quinolones    Other Reaction(s): Unknown   Semaglutide  Itching   Statins    Other Reaction(s): dizziness,felt depressed, myalgia, Other (See  Comments) Muscle cramps , And her blood sugar kept going up   Sulfamethoxazole Itching   Tetanus-diphtheria Toxoids Td    Other Reaction(s): hand itching   Zoster Vac Recomb Adjuvanted    Other Reaction(s): skin rash   Clarithromycin Itching   REACTION: severe itching        Medication List        Accurate as of June 12, 2023 11:36 AM. If you have any questions, ask your nurse or doctor.          amitriptyline  25 MG tablet Commonly known as: ELAVIL  Take 25 mg by mouth at bedtime.   cetirizine 10 MG tablet Commonly known as: ZYRTEC Take 10 mg by mouth daily.   EPINEPHrine  0.3 mg/0.3 mL Soaj injection Commonly known as: EPI-PEN Inject 0.3 mg into the muscle as needed for anaphylaxis.   fluconazole  150 MG tablet Commonly known as: DIFLUCAN  Take 1 tablet (150  mg total) by mouth every 3 (three) days.   fluticasone  50 MCG/ACT nasal spray Commonly known as: FLONASE  Place into both nostrils daily.   FreeStyle Libre 2 Sensor Misc APPLY 1 DEVICE EVERY 14 DAYS AS DIRECTED   FreeStyle Libre 3 Sensor Misc Place 1 sensor on the skin every 14 days. Use to check glucose continuously   FREESTYLE LITE test strip Generic drug: glucose blood 1 each by Other route daily in the afternoon. Use as instructed   OneTouch Ultra Blue Test test strip Generic drug: glucose blood Check blood sugar 2 times daily as needed   glimepiride  4 MG tablet Commonly known as: AMARYL  Take 1 tablet (4 mg total) by mouth daily with breakfast. Change Glimepiride  4 mg, to one and a half tablets before breakfast   ibuprofen 200 MG tablet Commonly known as: ADVIL Take 400 mg by mouth at bedtime as needed for moderate pain or mild pain.   Lutein  20 MG Caps Take 1 capsule by mouth daily.   Magnesium 400 MG Caps Take 1 tablet by mouth daily.   metoprolol  succinate 50 MG 24 hr tablet Commonly known as: TOPROL -XL Take 1 tablet (50 mg total) by mouth daily. Take with or immediately following a  meal.   MVW COMPLETE PROBIOTIC PO Take 1 capsule by mouth daily.   nystatin  cream Commonly known as: MYCOSTATIN  Nystatin  100,000 units vaginal suppository nightly x 14 days   omeprazole  20 MG capsule Commonly known as: PRILOSEC Take 20 mg by mouth daily.   Synthroid  100 MCG tablet Generic drug: levothyroxine  Take 1 tablet (100 mcg total) by mouth daily before breakfast.   VITAMIN D PO Take by mouth.   YUMVS BEET ROOT-TART CHERRY PO Take 1 capsule by mouth daily at 6 (six) AM.         OBJECTIVE:   Vital Signs: BP 124/80 (BP Location: Left Arm, Patient Position: Sitting, Cuff Size: Normal)   Pulse 78   Ht 5' 2.6" (1.59 m)   Wt 233 lb (105.7 kg)   SpO2 99%   BMI 41.80 kg/m   Wt Readings from Last 3 Encounters:  06/12/23 233 lb (105.7 kg)  01/15/23 235 lb (106.6 kg)  12/18/22 236 lb (107 kg)     Exam: General: Pt appears well and is in NAD  Lungs: Clear with good BS bilat   Heart: RRR   Extremities: Trace  pretibial edema.   Neuro: MS is good with appropriate affect, pt is alert and Ox3   DM foot exam:06/12/2023   The skin of the feet without sores or ulcerations. Plantar callous formation noted bilaterally  The pedal pulses are 2+ on right and 2+ on left. The sensation is intact to a screening 5.07, 10 gram monofilament bilaterally     DATA REVIEWED:  Lab Results  Component Value Date   HGBA1C 7.3 (A) 12/18/2022   HGBA1C 7.3 (A) 06/19/2022   HGBA1C 7.1 (A) 12/05/2021   03/29/2023 A1c 7.8% BUN 17 CR 0.850 GFR 69 TSH 2.350   05/07/2023 HDL 54 LDL 128 MA/CR ratio 0.7 Triglycerides 278  Old records , labs and images have been reviewed.   ASSESSMENT / PLAN / RECOMMENDATIONS:   1) Type 2 Diabetes Mellitus, Optimally  controlled, Without complications - Most recent A1c of 6.9 %. Goal A1c < 7.0 %.    -Patient with multiple allergic reaction/intolerance to multiple glycemic agents - Patient is intolerant to Jardiance due to recurrent vaginal  yeast infections, and intolerant to Metformin as well  as Glipizide  - rash  - Intolerant to Rybelsus -rash   - Patient would like to switch freestyle libre to Dexcom, prescription for Dexcom has been sent to the pharmacy  MEDICATIONS:  -Continue  glimepiride   4 mg,  1.5 tab  daily   EDUCATION / INSTRUCTIONS: BG monitoring instructions: Patient is instructed to check her blood sugars 3 times a day. Call [facility] if: BG persistently < 70      2) Diabetic complications:  Eye: Does not  have known diabetic retinopathy.  Neuro/ Feet: Does not have known diabetic peripheral neuropathy .  Renal: Patient does not have known baseline CKD. She   is not on an ACEI/ARB at present.   3) Hypothyroidism:   - TSH is normal  - Continue Synthroid  100 mcg daily      F/U in 6 months    Signed electronically by: Natale Bail, MD  [facility]  [facility] [address] [place] [place], [place] [address] Phone: [phone] FAX: [phone]   CC: Arva Lathe, MD [address] [place] [address] Phone: [phone]  Fax: [phone]  Return to Endocrinology clinic as below: Future Appointments  Date Time Provider Department Center  09/10/2023 10:30 AM Faustina Hood, MD [facility] None

## 2023-06-12 NOTE — Patient Instructions (Signed)
-   Continue Glimepiride  4 mg, 1.5  tablets before breakfast      HOW TO TREAT LOW BLOOD SUGARS (Blood sugar LESS THAN 70 MG/DL) Please follow the RULE OF 15 for the treatment of hypoglycemia treatment (when your (blood sugars are less than 70 mg/dL)   STEP 1: Take 15 grams of carbohydrates when your blood sugar is low, which includes:  3-4 GLUCOSE TABS  OR 3-4 OZ OF JUICE OR REGULAR SODA OR ONE TUBE OF GLUCOSE GEL    STEP 2: RECHECK blood sugar in 15 MINUTES STEP 3: If your blood sugar is still low at the 15 minute recheck --> then, go back to STEP 1 and treat AGAIN with another 15 grams of carbohydrates.

## 2023-07-02 ENCOUNTER — Telehealth: Payer: Self-pay | Admitting: Internal Medicine

## 2023-07-02 NOTE — Telephone Encounter (Signed)
 Patient came into the office and is inquiring about a PA for her Dexcom 3 or Freestyle libre + and it on her last sensor.

## 2023-07-02 NOTE — Telephone Encounter (Signed)
Libre 3 needs PA

## 2023-07-03 ENCOUNTER — Other Ambulatory Visit (HOSPITAL_COMMUNITY): Payer: Self-pay

## 2023-07-04 ENCOUNTER — Telehealth: Payer: Self-pay

## 2023-07-04 NOTE — Telephone Encounter (Signed)
 Pharmacy Patient Advocate Encounter   Received notification from Pt Calls Messages that prior authorization for Dexcom G7 sensor is required/requested.   Insurance verification completed.   The patient is insured through [facility] [employer]/RX ADVANCE .   Per test claim: PA required; PA submitted to above mentioned insurance via CoverMyMeds Key/confirmation #/EOC [id number] Status is pending

## 2023-07-04 NOTE — Telephone Encounter (Signed)
Dexcom?

## 2023-07-09 ENCOUNTER — Other Ambulatory Visit: Payer: Self-pay

## 2023-07-09 ENCOUNTER — Telehealth: Payer: Self-pay | Admitting: Internal Medicine

## 2023-07-09 MED ORDER — DEXCOM G7 RECEIVER DEVI: 1.0000 | 0 refills | Status: AC

## 2023-07-09 MED ORDER — DEXCOM G7 SENSOR MISC: 3 refills | Status: AC

## 2023-07-09 NOTE — Telephone Encounter (Signed)
 MEDICATION:  1)  Continuous Glucose Sensor (DEXCOM G7 SENSOR) MISC   2)  Dexcom Receiver  PHARMACY:    [id number] - [place], [place] - [address] [facility] [employer] [facility] (Ph: [phone])    HAS THE PATIENT CONTACTED THEIR PHARMACY?  Yes  IS THIS A 90 DAY SUPPLY : Yes  IS PATIENT OUT OF MEDICATION: Yes  IF NOT; HOW MUCH IS LEFT:   LAST APPOINTMENT DATE: @6 /10/2023  NEXT APPOINTMENT DATE:@12 /09/2023  DO WE HAVE YOUR PERMISSION TO LEAVE A DETAILED MESSAGE?:Yes  OTHER COMMENTS: May be new prescription for both   **Let patient know to contact pharmacy at the end of the day to make sure medication is ready. **  ** Please notify patient to allow 48-72 hours to process**  **Encourage patient to contact the pharmacy for refills or they can request refills through [facility]**

## 2023-07-09 NOTE — Telephone Encounter (Signed)
 Prescription for G7 sensors/ reciever has been sent.

## 2023-07-25 NOTE — Telephone Encounter (Signed)
 Pharmacy Patient Advocate Encounter  Received notification from [employer] that Prior Authorization for Dexcom G7 sensor  has been DENIED.  Full denial letter will be uploaded to the media tab. See denial reason below.   PA #/Case ID/Reference #: [id number]

## 2023-09-10 ENCOUNTER — Ambulatory Visit: Payer: PPO | Admitting: Internal Medicine

## 2023-10-01 DIAGNOSIS — I739 Peripheral vascular disease, unspecified: Secondary | ICD-10-CM | POA: Diagnosis not present

## 2023-10-01 DIAGNOSIS — I8310 Varicose veins of unspecified lower extremity with inflammation: Secondary | ICD-10-CM | POA: Diagnosis not present

## 2023-10-01 DIAGNOSIS — E1165 Type 2 diabetes mellitus with hyperglycemia: Secondary | ICD-10-CM | POA: Diagnosis not present

## 2023-10-01 DIAGNOSIS — M85859 Other specified disorders of bone density and structure, unspecified thigh: Secondary | ICD-10-CM | POA: Diagnosis not present

## 2023-10-01 DIAGNOSIS — I10 Essential (primary) hypertension: Secondary | ICD-10-CM | POA: Diagnosis not present

## 2023-10-01 DIAGNOSIS — E785 Hyperlipidemia, unspecified: Secondary | ICD-10-CM | POA: Diagnosis not present

## 2023-10-01 DIAGNOSIS — E039 Hypothyroidism, unspecified: Secondary | ICD-10-CM | POA: Diagnosis not present

## 2023-10-23 DIAGNOSIS — Z1231 Encounter for screening mammogram for malignant neoplasm of breast: Secondary | ICD-10-CM | POA: Diagnosis not present

## 2023-10-24 NOTE — Progress Notes (Unsigned)
 Patient ID: Jill Dean, female    DOB: 07-11-1945, 78 y.o.   MRN: [id number]  HPI female never smoker followed for allergic rhinitis/conjunctivitis, complicated by history Raynaud's syndrome, GERD, urticaria, DM, PSVT Allergen Panel 06/20/21- IgE 47, specific IgEs all low --------------------------------------------------------------------------------------------------   09/07/22- 78 year old female never smoker followed for Allergic Rhinitis/conjunctivitis, complicated by history Raynaud's syndrome, GERD, Urticaria, DM2, Hypothyroid, Dyslipidemia, HTN, PSVT, Covid infection Dec 2021, Hyperlipidemia, Obesity,  -Zyrtec, Flonase , Astelin  Allergen Panel 06/20/21- IgE 47, specific IgEs all low Has enough of her nasal sprays and antihistamine. This is mostly non-allergic/ vasomotor, but there is a seasonal component. Says hse had covid 3 weeks ago, with transient bad cough. Feels back to baseline. Not a vaxxer. CXR and L ribs 02/17/22 IMPRESSION: Negative.  10/24/23-  78 year old female never smoker followed for Allergic and Vasomotor Rhinitis and conjunctivitis, complicated by history Raynaud's syndrome, GERD, Urticaria, DM2, Hypothyroid, Dyslipidemia, HTN, PSVT, Covid infection Dec 2021, Hyperlipidemia, Obesity,  -Zyrtec, Flonase , Astelin  Declines flu vax. Discussed the use of AI scribe software for clinical note transcription with the patient, who gave verbal consent to proceed.  History of Present Illness   Jill Dean is a 78 year old female who presents for follow-up regarding her history of bronchitis and respiratory issues.  She experienced an allergic reaction to prescribed medication in [facility] for bronchitis, leading her to discontinue its use after three days. She uses a home remedy with turmeric, honey, garlic, and black pepper twice daily until symptoms resolve. She has been symptom-free for approximately eight months.  Mold exposure in her home, due to water leakage around a  window, was identified and addressed through removal and renovation. She believes this has improved her respiratory health.  She does not take any regular medications for her respiratory condition.     Assessment and Plan:    Allergic rhinitis Well-controlled with home remedy. No symptoms for eight months. No need for pharmacological intervention. - Continue home remedy as needed. - Schedule follow-up in one year with Dr. Maryalice.     Vasomotor rhinitis -symptomatic management now  Bronchitis- acute, intermittent, uncomplicated -doing very well currently   Review of Systems-See HPI + = positive Constitutional:   No-   weight loss, night sweats, fevers, chills, fatigue, lassitude. HEENT:    headaches, +difficulty swallowing, tooth/dental problems, +sore throat,       sneezing, itching, ear ache, +nasal congestion, +post nasal drip,  CV:  No-   chest pain, orthopnea, PND, swelling in lower extremities, anasarca,  dizziness, palpitations Resp: No-   shortness of breath with exertion or at rest.            -productive cough,  No non-productive cough,  No-  coughing up of blood.              -change in color of mucus.  No- wheezing.    Objective:   Physical Exam General- Alert, Oriented, Affect-appropriate, Distress- none acute  +Obese Skin- +faint erythema butterfly distribution Lymphadenopathy- none Head- atraumatic            Eyes- Gross vision intact, PERRLA, conjunctivae clear secretions, +periorbital edema            Ears- Hearing,             Nose-  +turbinate edema , No-Septal dev,  polyps, erosion, perforation,             Throat- Mallampati III-IV , mucosa clear-not red , drainage- none, tonsils-  atrophic.,  Neck- flexible , trachea midline, no stridor , thyroid  nl, carotid no bruit Chest -            Lung- clear to P&A but diminished, wheeze- none, cough- none , dullness-none, rub- none           Cardiac- RRR           Chest wall-  Abd-  Br/ Gen/ Rectal- Not done, not  indicated Extrem- cyanosis- none, clubbing, none, atrophy- none, strength- nl, + superficial varices Neuro- grossly intact to observation

## 2023-10-25 ENCOUNTER — Encounter: Payer: Self-pay | Admitting: Internal Medicine

## 2023-10-25 ENCOUNTER — Ambulatory Visit: Admitting: Internal Medicine

## 2023-10-25 VITALS — BP 126/62 | HR 87 | Temp 97.7°F | Ht 63.0 in | Wt 237.2 lb

## 2023-10-25 DIAGNOSIS — J3089 Other allergic rhinitis: Secondary | ICD-10-CM | POA: Diagnosis not present

## 2023-10-25 DIAGNOSIS — J209 Acute bronchitis, unspecified: Secondary | ICD-10-CM | POA: Diagnosis not present

## 2023-10-25 NOTE — Patient Instructions (Signed)
 Glad you are doing well. Please call if we can help

## 2023-12-02 DIAGNOSIS — R42 Dizziness and giddiness: Secondary | ICD-10-CM | POA: Diagnosis not present

## 2023-12-02 DIAGNOSIS — H8110 Benign paroxysmal vertigo, unspecified ear: Secondary | ICD-10-CM | POA: Diagnosis not present

## 2023-12-02 DIAGNOSIS — R110 Nausea: Secondary | ICD-10-CM | POA: Diagnosis not present

## 2023-12-11 ENCOUNTER — Ambulatory Visit: Admitting: Internal Medicine

## 2023-12-11 ENCOUNTER — Encounter: Payer: Self-pay | Admitting: Internal Medicine

## 2023-12-11 VITALS — BP 128/80 | Ht 63.0 in | Wt 235.0 lb

## 2023-12-11 DIAGNOSIS — E119 Type 2 diabetes mellitus without complications: Secondary | ICD-10-CM

## 2023-12-11 DIAGNOSIS — E1165 Type 2 diabetes mellitus with hyperglycemia: Secondary | ICD-10-CM

## 2023-12-11 DIAGNOSIS — E039 Hypothyroidism, unspecified: Secondary | ICD-10-CM

## 2023-12-11 DIAGNOSIS — Z7984 Long term (current) use of oral hypoglycemic drugs: Secondary | ICD-10-CM | POA: Diagnosis not present

## 2023-12-11 LAB — POCT GLYCOSYLATED HEMOGLOBIN (HGB A1C): Hemoglobin A1C: 7.3 % — AB (ref 4.0–5.6)

## 2023-12-11 MED ORDER — SYNTHROID 100 MCG PO TABS: 100.0000 ug | ORAL_TABLET | Freq: Every day | ORAL | 3 refills | Status: AC

## 2023-12-11 MED ORDER — GLIMEPIRIDE 4 MG PO TABS: 6.0000 mg | ORAL_TABLET | Freq: Every day | ORAL | 3 refills | Status: DC

## 2023-12-11 NOTE — Progress Notes (Signed)
 Name: Jill Dean  Age/ Sex: 78 y.o., female   MRN/ DOB: [id number], 01-01-1946     PCP: Elliot Charm, MD   Reason for Endocrinology Evaluation: Type 2 Diabetes Mellitus  Initial Endocrine Consultative Visit: 04/18/2019    PATIENT IDENTIFIER: Jill Dean is a 78 y.o. female with a past medical history of T2DM, Hypothyroidism and Dyslipidemia. The patient has followed with Endocrinology clinic since 04/18/2019 for consultative assistance with management of her diabetes.  DIABETIC HISTORY:  Jill Dean was diagnosed with DM in 2013. Metformin - GI issues, Jardiance - recurrent yeast infection, Glipizide - Hives . Her hemoglobin A1c has ranged from 7.0% in 2015, peaking at 7.8% in 2020.  In her initial visit to our clinic she had an A1c of 7.0%, we stopped Jardiance due to recurrent yeast infections. She is allergic due to Glipizide   Stop Rybelsus  March 2023 due to pruritus, we attempted again later in 2023 with similar results  SUBJECTIVE:   During the last visit (06/12/2023): A1c 6.9%    Today (12/11/2023): Jill Dean is here for a follow up on diabetes management.  She checks her blood sugars multiple  times daily. The patient has not had hypoglycemic episodes.  She continues to follow-up with cardiology for SVT She follows with pulmonary for allergic and vasomotor rhinitis  No nausea No constipation or diarrhea  Denies palpitations  HOME DIABETES REGIMEN:  Glimepiride  4 mg, 1.5 tab daily    Statin: Intolerant ACE-I/ARB: no    CONTINUOUS GLUCOSE MONITORING RECORD INTERPRETATION    Dates of Recording: 11/26-12/09/2023  Sensor description: dexcom  Results statistics:   CGM use % of time 96  Average and SD 164/32  Time in range 72%  % Time Above 180 27  % Time above 250 1  % Time Below target 0   Glycemic patterns summary: BGs remain at the upper limit of normal overnight and fluctuate during the day Hyperglycemic episodes postprandial  Hypoglycemic  episodes occurred N/A  Overnight periods: Upper limit of normal    DIABETIC COMPLICATIONS: Microvascular complications:    Denies: CKD, retinopathy , neuropathy  Last eye exam: Completed 3/272025   Macrovascular complications:    Denies: CAD, PVD, CVA     HISTORY:  Past Medical History:  Past Medical History:  Diagnosis Date   Chest pain    a. s/p normal ett nuclear stress test on 10/16/13   Complication of anesthesia    COVID-19 12/2019   Diabetes mellitus without complication (HCC)    Dysrhythmia    fast heart rate   GERD (gastroesophageal reflux disease)    HLD (hyperlipidemia)    Hypothyroidism    Obesity    Osteoarthrosis, unspecified whether generalized or localized, unspecified site    Other chronic allergic conjunctivitis    PONV (postoperative nausea and vomiting)    Raynaud's syndrome    Urticaria, unspecified    Past Surgical History:  Past Surgical History:  Procedure Laterality Date   CATARACT EXTRACTION Bilateral 2023   CHOLECYSTECTOMY     CYST EXCISION Left 11/10/2014   Procedure: EXCISION CYST LEFT INDEX FINGER AND CYST LEFT SMALL FINGER ;  Surgeon: Arley Curia, MD;  Location: [facility];  Service: Orthopedics;  Laterality: Left;   ESOPHAGOGASTRODUODENOSCOPY N/A 01/05/2014   Procedure: ESOPHAGOGASTRODUODENOSCOPY (EGD);  Surgeon: Gladis MARLA Louder, MD;  Location: [facility];  Service: Endoscopy;  Laterality: N/A;   knot right breast     LEFT HEART CATHETERIZATION WITH CORONARY ANGIOGRAM N/A 10/22/2013  Procedure: LEFT HEART CATHETERIZATION WITH CORONARY ANGIOGRAM;  Surgeon: Deatrice DELENA Cage, MD;  Location: [facility];  Service: Cardiovascular;  Laterality: N/A;   OOPHORECTOMY     LSO 84-RSO 04   thyroid  nodule     TONSILLECTOMY     VAGINAL HYSTERECTOMY  01/02/2002   LAVH RSO endometriosis   VESICOVAGINAL FISTULA CLOSURE W/ TAH     Social History:  reports that she has never smoked. She has never used smokeless tobacco. She  reports current alcohol use. She reports that she does not use drugs. Family History:  Family History  Problem Relation Age of Onset   Lung cancer Father    Heart disease Father    Heart failure Mother    Dementia Mother    Heart disease Maternal Uncle    Lung cancer Paternal Uncle    COPD Paternal Uncle      HOME MEDICATIONS: Allergies as of 12/11/2023       Reactions   Hydrocod Poli-chlorphe Poli Er Other (See Comments)   hyperactivity   Pseudoephedrine Itching, Palpitations, Other (See Comments)   REACTION: tachycardia   Sulfonamide Derivatives Anaphylaxis, Swelling   Azithromycin Itching   Buprenorphine Hcl Itching   Codeine Itching   Dilaudid [hydromorphone Hcl] Itching   Macrodantin  [nitrofurantoin ] Itching   Hands and feet itching. Trouble breathing w/lump in throat and stuffy nose.   Morphine And Codeine Itching   Shrimp Extract Itching   Shrimp [shellfish Allergy ] Itching   Simvastatin Other (See Comments)   Fatigue, depression and dizziness at 20 mg   Atorvastatin     Muscle cramps  And her blood sugar kept going up   Cefuroxime    Other Reaction(s): swelling eyes   Cephalosporins Swelling   Said to cause facial swelling   Covid-19 (adenovirus) Vaccine    Other reaction(s): rash, itching, skin tight   Empagliflozin    Other reaction(s): yeast infection   Erythromycin Base Itching, Swelling   Hydrocod Poli-chlorphe Poli Er    Other reaction(s): flushing and facial edema   Hydrocodone-acetaminophen  Other (See Comments)   Redness of face, swelling of tolerates tylenol    Iodine    Other Reaction(s): Unknown   Mercaptobenzothiazole    Other Reaction(s): Unknown   Metformin Hcl    Other reaction(s): epigastric pain   Metronidazole     Other reaction(s): tonsil swelling   Other    Rybelsus ---anaphylaxsis   Penicillinase Itching   Quinolones    Other Reaction(s): Unknown   Semaglutide  Itching   Statins    Other Reaction(s): dizziness,felt depressed,  myalgia, Other (See Comments) Muscle cramps , And her blood sugar kept going up   Sulfamethoxazole Itching   Tetanus-diphtheria Toxoids Td    Other Reaction(s): hand itching   Zoster Vac Recomb Adjuvanted    Other Reaction(s): skin rash   Clarithromycin Itching   REACTION: severe itching        Medication List        Accurate as of December 11, 2023 12:32 PM. If you have any questions, ask your nurse or doctor.          amitriptyline  25 MG tablet Commonly known as: ELAVIL  Take 25 mg by mouth at bedtime.   cetirizine 10 MG tablet Commonly known as: ZYRTEC Take 10 mg by mouth daily.   Dexcom G7 Receiver Devi 1 Device by Does not apply route continuous.   Dexcom G7 Sensor Misc Change sensor every 10 days What changed: Another medication with the same name was removed. Continue  taking this medication, and follow the directions you see here. Changed by: Donell PARAS Talulah Schirmer   EPINEPHrine  0.3 mg/0.3 mL Soaj injection Commonly known as: EPI-PEN Inject 0.3 mg into the muscle as needed for anaphylaxis.   fluconazole  150 MG tablet Commonly known as: DIFLUCAN  Take 1 tablet (150 mg total) by mouth every 3 (three) days.   fluticasone  50 MCG/ACT nasal spray Commonly known as: FLONASE  Place into both nostrils daily.   FREESTYLE LITE test strip Generic drug: glucose blood 1 each by Other route daily in the afternoon. Use as instructed   OneTouch Ultra Blue Test test strip Generic drug: glucose blood Check blood sugar 2 times daily as needed   glimepiride  4 MG tablet Commonly known as: AMARYL  Take 1.5 tablets (6 mg total) by mouth daily with breakfast.   ibuprofen 200 MG tablet Commonly known as: ADVIL Take 400 mg by mouth at bedtime as needed for moderate pain or mild pain.   Lutein  20 MG Caps Take 1 capsule by mouth daily.   Magnesium 400 MG Caps Take 1 tablet by mouth daily.   metoprolol  succinate 50 MG 24 hr tablet Commonly known as: TOPROL -XL Take 1  tablet (50 mg total) by mouth daily. Take with or immediately following a meal.   MVW COMPLETE PROBIOTIC PO Take 1 capsule by mouth daily.   nystatin  cream Commonly known as: MYCOSTATIN  Nystatin  100,000 units vaginal suppository nightly x 14 days   omeprazole  20 MG capsule Commonly known as: PRILOSEC Take 20 mg by mouth daily.   Synthroid  100 MCG tablet Generic drug: levothyroxine  Take 1 tablet (100 mcg total) by mouth daily before breakfast.   VITAMIN D PO Take by mouth.   YUMVS BEET ROOT-TART CHERRY PO Take 1 capsule by mouth daily at 6 (six) AM.         OBJECTIVE:   Vital Signs: BP 128/80   Ht 5' 3 (1.6 m)   Wt 235 lb (106.6 kg)   BMI 41.63 kg/m   Wt Readings from Last 3 Encounters:  12/11/23 235 lb (106.6 kg)  10/25/23 237 lb 3.2 oz (107.6 kg)  06/12/23 233 lb (105.7 kg)     Exam: General: Jill Dean appears well and is in NAD  Lungs: Clear with good BS bilat   Heart: RRR   Extremities: Trace  pretibial edema.   Neuro: MS is good with appropriate affect, Jill Dean is alert and Ox3   DM foot exam:06/12/2023   The skin of the feet without sores or ulcerations. Plantar callous formation noted bilaterally  The pedal pulses are 2+ on right and 2+ on left. The sensation is intact to a screening 5.07, 10 gram monofilament bilaterally     DATA REVIEWED:  Lab Results  Component Value Date   HGBA1C 7.3 (A) 12/11/2023   HGBA1C 6.9 (A) 06/12/2023   HGBA1C 7.3 (A) 12/18/2022   03/29/2023 A1c 7.8% BUN 17 CR 0.850 GFR 69 TSH 2.350   05/07/2023 HDL 54 LDL 128 MA/CR ratio 0.7 Triglycerides 278  Old records , labs and images have been reviewed.   ASSESSMENT / PLAN / RECOMMENDATIONS:   1) Type 2 Diabetes Mellitus, Sub-Optimally  controlled, Without complications - Most recent A1c of 7.3 %. Goal A1c < 7.0 %.    -Patient has been noted worsening glycemic control, patient admits to dietary indiscretions. -Patient with multiple allergic reaction/intolerance to  multiple glycemic agents - Patient is intolerant to Jardiance due to recurrent vaginal yeast infections, and intolerant to Metformin as well as  Glipizide  - rash  - Intolerant to Rybelsus -rash  - We did discuss the option of increasing glimepiride , but the patient would like to focus on lifestyle changes rather than making any changes at this time    MEDICATIONS:  -Continue  glimepiride   4 mg,  1.5 tab  daily   EDUCATION / INSTRUCTIONS: BG monitoring instructions: Patient is instructed to check her blood sugars 3 times a day. Call [facility] if: BG persistently < 70      2) Diabetic complications:  Eye: Does not  have known diabetic retinopathy.  Neuro/ Feet: Does not have known diabetic peripheral neuropathy .  Renal: Patient does not have known baseline CKD. She   is not on an ACEI/ARB at present.   3) Hypothyroidism:  -Patient is clinically euthyroid - TSH is normal   Medication  Continue Synthroid  100 mcg daily      F/U in 6 months    Signed electronically by: Stefano Redgie Butts, MD  [facility]  [facility] [address] [place], [place] [address] Phone: [phone] FAX: [phone]   CC: Elliot Charm, MD [address] [place] [address] Phone: [phone]  Fax: [phone]  Return to Endocrinology clinic as below: Future Appointments  Date Time Provider Department Center  06/10/2024 10:50 AM Joice Nazario, Donell Redgie, MD [facility] None

## 2023-12-11 NOTE — Patient Instructions (Signed)
-   Continue Glimepiride  4 mg, 1.5  tablets before breakfast      HOW TO TREAT LOW BLOOD SUGARS (Blood sugar LESS THAN 70 MG/DL) Please follow the RULE OF 15 for the treatment of hypoglycemia treatment (when your (blood sugars are less than 70 mg/dL)   STEP 1: Take 15 grams of carbohydrates when your blood sugar is low, which includes:  3-4 GLUCOSE TABS  OR 3-4 OZ OF JUICE OR REGULAR SODA OR ONE TUBE OF GLUCOSE GEL    STEP 2: RECHECK blood sugar in 15 MINUTES STEP 3: If your blood sugar is still low at the 15 minute recheck --> then, go back to STEP 1 and treat AGAIN with another 15 grams of carbohydrates.

## 2023-12-12 ENCOUNTER — Telehealth: Payer: Self-pay | Admitting: *Deleted

## 2023-12-12 NOTE — Telephone Encounter (Signed)
 Spoke with patient. Patient reports vaginal discomfort and pressure for the past 3 days. Reports urgency, voiding normal amounts. Hydrating well. Hx of DM2. Reports burning sensation.   Denies fever/chills, N/V, bleeding, odor, itching, flank pain.   Has not tried anything OTC. States symptoms similar to her last visit in May, testing was negative. Patient is requesting OV. OV scheduled for 12/13/23 at 1215 with Tiffany. Advised will forward to Tiffany to review, our office will f/u with any additional recommendations. Patient agreeable.   Routing to provider for final review. Patient is agreeable to disposition. Will close encounter.

## 2023-12-13 ENCOUNTER — Ambulatory Visit: Admitting: Nurse Practitioner

## 2023-12-13 VITALS — BP 118/74 | HR 78 | Temp 97.9°F | Wt 237.0 lb

## 2023-12-13 DIAGNOSIS — R300 Dysuria: Secondary | ICD-10-CM | POA: Diagnosis not present

## 2023-12-13 DIAGNOSIS — N9489 Other specified conditions associated with female genital organs and menstrual cycle: Secondary | ICD-10-CM

## 2023-12-13 DIAGNOSIS — N952 Postmenopausal atrophic vaginitis: Secondary | ICD-10-CM | POA: Diagnosis not present

## 2023-12-13 LAB — URINALYSIS, COMPLETE W/RFL CULTURE
Bacteria, UA: NONE SEEN /HPF
Bilirubin Urine: NEGATIVE
Glucose, UA: NEGATIVE
Hgb urine dipstick: NEGATIVE
Hyaline Cast: NONE SEEN /LPF
Ketones, ur: NEGATIVE
Leukocyte Esterase: NEGATIVE
Nitrites, Initial: NEGATIVE
Protein, ur: NEGATIVE
RBC / HPF: NONE SEEN /HPF (ref 0–2)
Specific Gravity, Urine: 1.01 (ref 1.001–1.035)
WBC, UA: NONE SEEN /HPF (ref 0–5)
pH: 5.5 (ref 5.0–8.0)

## 2023-12-13 LAB — NO CULTURE INDICATED

## 2023-12-13 LAB — WET PREP FOR TRICH, YEAST, CLUE

## 2023-12-13 MED ORDER — ESTRADIOL 0.01 % VA CREA: 1.0000 g | TOPICAL_CREAM | VAGINAL | 0 refills | Status: AC

## 2023-12-13 NOTE — Progress Notes (Signed)
° °  Acute Office Visit  Subjective:    Patient ID: Jill Dean, female    DOB: 06-01-1945, 78 y.o.   MRN: [id number]   HPI 78 y.o. presents today for vulvar burning, dysuria, frequency and urgency x 6 days. Denies discharge or odor. H/O candida glabrata 01/2023. Negative yeast culture in May. Feels these symptoms come and go.   No LMP recorded. Patient has had a hysterectomy.    Review of Systems  Constitutional: Negative.   Genitourinary:  Positive for dysuria, frequency, urgency and vaginal pain (Vulvar burning). Negative for genital sores, hematuria and vaginal discharge.       Objective:    Physical Exam Constitutional:      Appearance: Normal appearance.  Genitourinary:    General: Normal vulva.     Vagina: Normal.     Comments: Atrophic changes    BP 118/74 (BP Location: Left Arm, Patient Position: Sitting, Cuff Size: Large)   Pulse 78   Temp 97.9 F (36.6 C) (Oral)   Wt 237 lb (107.5 kg)   SpO2 96%   BMI 41.98 kg/m  Wt Readings from Last 3 Encounters:  12/13/23 237 lb (107.5 kg)  12/11/23 235 lb (106.6 kg)  10/25/23 237 lb 3.2 oz (107.6 kg)        Jill Dean, CMA present as chaperone.   UA negative Wet prep negative for pathogens  Assessment & Plan:   Problem List Items Addressed This Visit   None Visit Diagnoses       Vaginal atrophy    -  Primary   Relevant Medications   estradiol  (ESTRACE ) 0.01 % CREA vaginal cream     Dysuria       Relevant Orders   Urinalysis,Complete w/RFL Culture     Vulvar burning       Relevant Orders   WET PREP FOR TRICH, YEAST, CLUE      Plan: Negative wet prep and UA. Discussed atrophic changes and associated symptoms. Will try vaginal estrogen. Will use nightly first 2 weeks, then twice weekly. Can apply externally only.   Return if symptoms worsen or fail to improve.    Jill DELENA Shutter DNP, 12:33 PM 12/13/2023

## 2024-01-02 ENCOUNTER — Telehealth: Payer: Self-pay

## 2024-01-02 ENCOUNTER — Other Ambulatory Visit: Payer: Self-pay | Admitting: Nurse Practitioner

## 2024-01-02 DIAGNOSIS — N760 Acute vaginitis: Secondary | ICD-10-CM

## 2024-01-02 MED ORDER — FLUCONAZOLE 150 MG PO TABS: 150.0000 mg | ORAL_TABLET | ORAL | 0 refills | Status: AC

## 2024-01-02 NOTE — Telephone Encounter (Signed)
"  Patient notified   "

## 2024-01-02 NOTE — Telephone Encounter (Signed)
 Diflucan  sent to pharmacy in [place].

## 2024-01-02 NOTE — Telephone Encounter (Signed)
 Patient was seen on 12-13-23 & had negative u/a & wet prep. She has vaginal atrophy and was prescribed estrace  vaginal cream. Patient calling triage today c/o vaginal itching that started 4 days ago. Itching is both internal & external. No other symptoms. She said she has been using the estrace  cream. [place] and wanted to know if you could send something in for her. Patient stated [facility]. She [facility] and she said she would have it transferred. She also said her blood sugars have been elevated more than normal. Please advise Routing to Tiffany,NP

## 2024-01-08 ENCOUNTER — Other Ambulatory Visit: Payer: Self-pay | Admitting: Physician Assistant

## 2024-02-03 ENCOUNTER — Other Ambulatory Visit: Payer: Self-pay | Admitting: Internal Medicine

## 2024-02-06 ENCOUNTER — Other Ambulatory Visit: Payer: Self-pay | Admitting: Internal Medicine

## 2024-02-06 ENCOUNTER — Other Ambulatory Visit (HOSPITAL_COMMUNITY): Payer: Self-pay

## 2024-02-06 ENCOUNTER — Other Ambulatory Visit: Payer: Self-pay | Admitting: Cardiology

## 2024-02-06 DIAGNOSIS — E119 Type 2 diabetes mellitus without complications: Secondary | ICD-10-CM

## 2024-02-06 MED ORDER — ACCU-CHEK GUIDE W/DEVICE KIT: PACK | 0 refills | Status: AC

## 2024-02-06 MED ORDER — ACCU-CHEK GUIDE TEST VI STRP: ORAL_STRIP | 12 refills | Status: AC

## 2024-02-06 MED ORDER — ACCU-CHEK SOFTCLIX LANCETS MISC: 12 refills | Status: AC

## 2024-02-06 NOTE — Telephone Encounter (Signed)
 Pharmacy Patient Advocate Encounter   Insurance verification completed.   The patient is insured through [facility] ADVANTAGE/RX ADVANCE.   Per test claim:  ACCU CHEK GUIDE is preferred by the insurance.  If suggested medication is appropriate, Please send in a new RX and discontinue this one. If not, please advise as to why it's not appropriate so that we may request a Prior Authorization. Please note, some preferred medications may still require a PA.  If the suggested medications have not been trialed and there are no contraindications to their use, the PA will not be submitted, as it will not be approved.   **Copay is $0.00 for supplies.**

## 2024-04-02 ENCOUNTER — Encounter: Admitting: Nurse Practitioner

## 2024-06-10 ENCOUNTER — Ambulatory Visit: Admitting: Internal Medicine
